# Patient Record
Sex: Male | Born: 1955 | Race: White | Hispanic: No | Marital: Married | Smoking: Current every day smoker
Health system: Southern US, Community
[De-identification: ages and names within clinical notes are randomized; demographics above are authoritative.]

## PROBLEM LIST (undated history)

## (undated) DIAGNOSIS — R454 Irritability and anger: Secondary | ICD-10-CM

## (undated) DIAGNOSIS — F419 Anxiety disorder, unspecified: Secondary | ICD-10-CM

## (undated) DIAGNOSIS — F17200 Nicotine dependence, unspecified, uncomplicated: Secondary | ICD-10-CM

## (undated) DIAGNOSIS — I10 Essential (primary) hypertension: Secondary | ICD-10-CM

## (undated) DIAGNOSIS — S42309A Unspecified fracture of shaft of humerus, unspecified arm, initial encounter for closed fracture: Secondary | ICD-10-CM

## (undated) DIAGNOSIS — M19049 Primary osteoarthritis, unspecified hand: Secondary | ICD-10-CM

## (undated) DIAGNOSIS — M1990 Unspecified osteoarthritis, unspecified site: Secondary | ICD-10-CM

## (undated) DIAGNOSIS — Z973 Presence of spectacles and contact lenses: Secondary | ICD-10-CM

## (undated) DIAGNOSIS — I4891 Unspecified atrial fibrillation: Secondary | ICD-10-CM

## (undated) HISTORY — DX: Anxiety disorder, unspecified: F41.9

## (undated) HISTORY — PX: SHOULDER ARTHROSCOPY: SHX128

## (undated) HISTORY — DX: Irritability and anger: R45.4

## (undated) HISTORY — DX: Primary osteoarthritis, unspecified hand: M19.049

## (undated) HISTORY — DX: Unspecified atrial fibrillation: I48.91

## (undated) HISTORY — PX: KNEE ARTHROSCOPY: SUR90

## (undated) HISTORY — DX: Essential (primary) hypertension: I10

## (undated) HISTORY — DX: Presence of spectacles and contact lenses: Z97.3

## (undated) HISTORY — DX: Nicotine dependence, unspecified, uncomplicated: F17.200

---

## 2002-11-14 ENCOUNTER — Emergency Department (HOSPITAL_COMMUNITY): Admission: AD | Admit: 2002-11-14 | Discharge: 2002-11-14 | Payer: Self-pay | Admitting: Emergency Medicine

## 2004-07-02 ENCOUNTER — Encounter: Admission: RE | Admit: 2004-07-02 | Discharge: 2004-07-02 | Payer: Self-pay | Admitting: Family Medicine

## 2004-12-27 ENCOUNTER — Encounter: Admission: RE | Admit: 2004-12-27 | Discharge: 2004-12-27 | Payer: Self-pay | Admitting: Family Medicine

## 2005-04-10 ENCOUNTER — Ambulatory Visit (HOSPITAL_BASED_OUTPATIENT_CLINIC_OR_DEPARTMENT_OTHER): Admission: RE | Admit: 2005-04-10 | Discharge: 2005-04-11 | Payer: Self-pay | Admitting: Orthopedic Surgery

## 2005-07-19 ENCOUNTER — Ambulatory Visit: Payer: Self-pay | Admitting: Family Medicine

## 2005-08-19 ENCOUNTER — Ambulatory Visit: Payer: Self-pay | Admitting: Family Medicine

## 2005-09-04 ENCOUNTER — Ambulatory Visit (HOSPITAL_BASED_OUTPATIENT_CLINIC_OR_DEPARTMENT_OTHER): Admission: RE | Admit: 2005-09-04 | Discharge: 2005-09-04 | Payer: Self-pay | Admitting: Orthopedic Surgery

## 2006-03-24 ENCOUNTER — Ambulatory Visit: Payer: Self-pay | Admitting: Family Medicine

## 2006-04-07 ENCOUNTER — Ambulatory Visit: Payer: Self-pay | Admitting: Family Medicine

## 2006-10-17 ENCOUNTER — Ambulatory Visit: Payer: Self-pay | Admitting: Family Medicine

## 2007-01-14 ENCOUNTER — Encounter: Admission: RE | Admit: 2007-01-14 | Discharge: 2007-01-14 | Payer: Self-pay | Admitting: Family Medicine

## 2007-01-14 ENCOUNTER — Ambulatory Visit: Payer: Self-pay | Admitting: Family Medicine

## 2007-01-19 ENCOUNTER — Ambulatory Visit: Payer: Self-pay | Admitting: Family Medicine

## 2007-01-21 ENCOUNTER — Ambulatory Visit: Payer: Self-pay | Admitting: Family Medicine

## 2007-02-05 ENCOUNTER — Ambulatory Visit: Payer: Self-pay | Admitting: Family Medicine

## 2007-02-27 ENCOUNTER — Encounter: Admission: RE | Admit: 2007-02-27 | Discharge: 2007-02-27 | Payer: Self-pay | Admitting: Family Medicine

## 2008-07-05 ENCOUNTER — Ambulatory Visit: Payer: Self-pay | Admitting: Family Medicine

## 2009-01-27 ENCOUNTER — Ambulatory Visit: Payer: Self-pay | Admitting: Family Medicine

## 2010-01-14 HISTORY — PX: COLONOSCOPY: SHX174

## 2010-02-04 ENCOUNTER — Encounter: Payer: Self-pay | Admitting: Family Medicine

## 2010-06-01 NOTE — Op Note (Signed)
NAMEQUINTO, Troy Chapman             ACCOUNT NO.:  [id number]   MEDICAL RECORD NO.:  [id number]          PATIENT TYPE:  AMB   LOCATION:  DSC                          FACILITY:  [facility]   PHYSICIAN:  Loreta Ave, M.D. DATE OF BIRTH:  Jun 16, 1955   DATE OF PROCEDURE:  04/10/2005  DATE OF DISCHARGE:                                 OPERATIVE REPORT   PREOPERATIVE DIAGNOSIS:  Impingement, left shoulder.  Partial versus  complete rotator cuff tear.  Anterior labral tear.  Degenerative arthritis  AC joint.   POSTOPERATIVE DIAGNOSIS:  Impingement, left shoulder.  Partial versus  complete rotator cuff tear.  Anterior labral tear.  Degenerative arthritis  AC joint. Full thickness tear, rotator cuff.  Also grade 2 and 3  degenerative arthritis with some focal grade 4 changes, glenohumeral joint.   OPERATION PERFORMED:  Left shoulder examination under anesthesia,  arthroscopy, debridement of glenohumeral joint with chondroplasty,  debridement of labrum, assessment, debridement of rotator cuff.  Bursectomy.  Acromioplasty. CA ligament release.  Excision of distal clavicle.  Mini open  repair rotator cuff tear with FiberWire suture and Concept repair system.   SURGEON:  Loreta Ave, M.D.   ASSISTANT:  Genene Churn. Denton Meek.   ANESTHESIA:  General.   ESTIMATED BLOOD LOSS:  Minimal.   SPECIMENS:  None.   CULTURES:  None.   COMPLICATIONS:  None.   DRESSING:  Soft compressive with shoulder immobilizer.   DESCRIPTION OF PROCEDURE:  The patient was brought to the operating room and  placed on operating table in supine position at [facility] outpatient.  Shoulder  examined.  Full motion, good stability.  Placed in a beach chair position on  the shoulder positioner, prepped and draped in the usual sterile fashion.  Three standard portals, anterior, posterior lateral.  Shoulder entered with  blunt obturator, arthroscope introduced, shoulder inspected after being  distended.  Diffuse grade 2 and  3 changes of glenohumeral joint debrided.  Some worrisome focal grade 4 on the front of the glenoid and back of the  humerus.  Despite this, I was not seeing a pattern of instability.  Circumferential complex tearing labrum debrided.  Biceps tendon, biceps  anchor, capsular ligamentous structures all intact.  Chondral loose bodies  removed.  Undersurface rotator cuff, functional full thickness tearing  anterior half supraspinatus.  Just a few thin stretched fibers left.  Fortunately not much retraction.  Cannula redirected subacromially.  Confirmed impingement and tearing of the anterior aspect of the  supraspinatus and also interval tear running from lateral to medial towards  the back of the supraspinatus. Type 2 to 3 acromion.  Bursa resected, cuff  debrided.  Acromioplasty to a type 1 acromion with shaver and high speed  bur, release of CA ligament with cautery.  Distal clavicle grade 4 changes  periarticular spurs.  Lateral centimeter of clavicle, periarticular spurs  removed.  Adequacy of decompression and clavicle excision confirmed viewing  from all portals.  Instruments and fluid removed.  Deltoid splitting  incision through lateral portal.  Subacromial space accessed.  Good  decompression.  Cuff was debrided back to healthy  tissue and mobilized.  It  was then captured with a weave of FiberWire suture with #2 FiberWire x2.  The posterior suture was placed in a running manner to close the interval  tear and then sutures brought out laterally.  Sutures passed through a  series of drill holes in the tuberosity with the Concept repair system.  Arm  abducted.  Sutures firmly tied over a bony bridge.  Arm was then brought  through a full motion with excellent repair, watertight closure without  undue tension even with the arm at the side.  Nice firm closure.  Wound  irrigated.  Deltoid closed with Vicryl.  Skin and subcutaneous tissue with  Vicryl and portals closed with nylon.  I  injected Marcaine without  epinephrine.  Sterile compressive dressing applied.  Shoulder immobilizer  applied.  Anesthesia reversed.  Brought to recovery room.  Tolerated surgery  well, no complications.      Loreta Ave, M.D.  Electronically Signed     DFM/MEDQ  D:  04/11/2005  T:  04/12/2005  Job:  [id number]

## 2010-06-01 NOTE — Op Note (Signed)
Troy Chapman, Troy Chapman             ACCOUNT NO.:  [id number]   MEDICAL RECORD NO.:  [id number]          PATIENT TYPE:  AMB   LOCATION:  DSC                          FACILITY:  [facility]   PHYSICIAN:  Loreta Ave, M.D. DATE OF BIRTH:  May 29, 1955   DATE OF PROCEDURE:  09/04/2005  DATE OF DISCHARGE:                                 OPERATIVE REPORT   PREOPERATIVE DIAGNOSIS:  Right knee medial meniscus tear.   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  Reactive synovitis.   PROCEDURE PERFORMED:  Right knee examination under anesthesia, arthroscopy  with partial medial meniscectomy.  Extensive chondroplasty trochlea.  Lesser  extent chondroplasty medial femoral condyle.  Partial synovectomy.   SURGEON:  Loreta Ave, M.D.   ASSISTANT SURGEON:  Genene Churn. Denton Meek.   ANESTHESIA:  Knee block with sedation.   SPECIMENS:  None.   CULTURES:  None.   COMPLICATIONS:  None.   DRESSINGS:  Soft compressive.   DESCRIPTION OF PROCEDURE:  The patient was brought to the operating room and  placed onto the operating table in the supine position.  After adequate  anesthesia had been obtained, the leg was examined.  Full motion and good  stability.  Prepped and draped in the usual sterile fashion.  Three portals  created -- one superolateral; one each medial and lateral parapatellar.  Inflow catheter introduced, the knee distended.  Arthroscope introduced and  the knee inspected.  Good patellofemoral tracking.  The patella itself  actually looked good.  The whole central portion of the trochlea, however,  had deep grade III lesion.   Chondroplasty to a stable surface throughout.  Numerous chondral loose  bodies were removed.  A lot of hypertrophic synovitis throughout the knee  debrided.  Cruciate ligaments intact.  Lateral meniscus and lateral  compartment looked excellent.  Medial meniscus with extensive complex tear;  and posterior half taken off stable rim and tapered into remaining meniscus.  Much of the weightbearing dome, medial femoral condyle grade III changes  debrided.  Nothing grade IV.  Entire knee examined.  No other findings were  appreciated.  The instruments were  removed.  The portals in the knee were injected with Marcaine.  The ports  were closed with 4-0 nylon.  A sterile compressive dressing was applied.  Anesthesia was reversed.  The patient was brought to the recovery room.  Tolerated the surgery well with no complications.      Loreta Ave, M.D.  Electronically Signed     DFM/MEDQ  D:  09/04/2005  T:  09/04/2005  Job:  [id number]

## 2012-08-10 ENCOUNTER — Ambulatory Visit (INDEPENDENT_AMBULATORY_CARE_PROVIDER_SITE_OTHER): Payer: BC Managed Care – PPO | Admitting: Medical

## 2012-08-10 ENCOUNTER — Telehealth: Payer: Self-pay | Admitting: Internal Medicine

## 2012-08-10 ENCOUNTER — Other Ambulatory Visit: Payer: Self-pay | Admitting: Medical

## 2012-08-10 ENCOUNTER — Encounter: Payer: Self-pay | Admitting: Medical

## 2012-08-10 VITALS — BP 130/80 | HR 60 | Temp 98.2°F | Resp 16 | Ht 66.0 in | Wt 183.0 lb

## 2012-08-10 DIAGNOSIS — R454 Irritability and anger: Secondary | ICD-10-CM

## 2012-08-10 DIAGNOSIS — F411 Generalized anxiety disorder: Secondary | ICD-10-CM

## 2012-08-10 DIAGNOSIS — F17200 Nicotine dependence, unspecified, uncomplicated: Secondary | ICD-10-CM

## 2012-08-10 DIAGNOSIS — I10 Essential (primary) hypertension: Secondary | ICD-10-CM

## 2012-08-10 DIAGNOSIS — R4589 Other symptoms and signs involving emotional state: Secondary | ICD-10-CM

## 2012-08-10 DIAGNOSIS — M2550 Pain in unspecified joint: Secondary | ICD-10-CM

## 2012-08-10 DIAGNOSIS — F419 Anxiety disorder, unspecified: Secondary | ICD-10-CM

## 2012-08-10 MED ORDER — MELOXICAM 15 MG PO TABS: 15.0000 mg | ORAL_TABLET | Freq: Every day | ORAL | Status: DC

## 2012-08-10 MED ORDER — TRAMADOL HCL 50 MG PO TABS: 50.0000 mg | ORAL_TABLET | Freq: Three times a day (TID) | ORAL | Status: DC | PRN

## 2012-08-10 MED ORDER — LISINOPRIL 10 MG PO TABS: 10.0000 mg | ORAL_TABLET | Freq: Every day | ORAL | Status: DC

## 2012-08-10 NOTE — Telephone Encounter (Signed)
Called out Tramadol for pt per shane to [facility] [address]
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
## 2012-08-11 LAB — CBC WITH DIFFERENTIAL/PLATELET
Basophils Absolute: 0.1 10*3/uL (ref 0.0–0.1)
Basophils Relative: 1 % (ref 0–1)
Eosinophils Absolute: 0.3 10*3/uL (ref 0.0–0.7)
Eosinophils Relative: 3 % (ref 0–5)
HCT: 45.4 % (ref 39.0–52.0)
Hemoglobin: 15.1 g/dL (ref 13.0–17.0)
Lymphocytes Relative: 22 % (ref 12–46)
Lymphs Abs: 1.9 10*3/uL (ref 0.7–4.0)
MCH: 28.1 pg (ref 26.0–34.0)
MCHC: 33.3 g/dL (ref 30.0–36.0)
MCV: 84.4 fL (ref 78.0–100.0)
Monocytes Absolute: 0.6 10*3/uL (ref 0.1–1.0)
Monocytes Relative: 6 % (ref 3–12)
Neutro Abs: 6.1 10*3/uL (ref 1.7–7.7)
Neutrophils Relative %: 68 % (ref 43–77)
Platelets: 197 10*3/uL (ref 150–400)
RBC: 5.38 MIL/uL (ref 4.22–5.81)
RDW: 14.7 % (ref 11.5–15.5)
WBC: 8.9 10*3/uL (ref 4.0–10.5)

## 2012-08-11 LAB — COMPREHENSIVE METABOLIC PANEL
ALT: 21 U/L (ref 0–53)
AST: 21 U/L (ref 0–37)
Albumin: 4.6 g/dL (ref 3.5–5.2)
Alkaline Phosphatase: 76 U/L (ref 39–117)
BUN: 21 mg/dL (ref 6–23)
CO2: 27 mEq/L (ref 19–32)
Calcium: 9.5 mg/dL (ref 8.4–10.5)
Chloride: 106 mEq/L (ref 96–112)
Creat: 0.97 mg/dL (ref 0.50–1.35)
Glucose, Bld: 64 mg/dL — ABNORMAL LOW (ref 70–99)
Potassium: 4.5 mEq/L (ref 3.5–5.3)
Sodium: 144 mEq/L (ref 135–145)
Total Bilirubin: 0.4 mg/dL (ref 0.3–1.2)
Total Protein: 6.9 g/dL (ref 6.0–8.3)

## 2012-08-11 LAB — RHEUMATOID FACTOR: Rheumatoid fact SerPl-aCnc: <10 IU/mL (ref <=14)

## 2012-08-11 LAB — URIC ACID: Uric Acid, Serum: 7.6 mg/dL (ref 4.0–7.8)

## 2012-08-11 LAB — ANA: Anti Nuclear Antibody(ANA): NEGATIVE

## 2012-08-11 LAB — CYCLIC CITRUL PEPTIDE ANTIBODY, IGG: Cyclic Citrullin Peptide Ab: <2 U/mL (ref 0.0–5.0)

## 2012-08-11 LAB — SEDIMENTATION RATE: Sed Rate: 1 mm/hr (ref 0–16)

## 2012-11-09 ENCOUNTER — Telehealth: Payer: Self-pay | Admitting: Medical

## 2012-11-09 MED ORDER — CITALOPRAM HYDROBROMIDE 40 MG PO TABS: 40.0000 mg | ORAL_TABLET | Freq: Every day | ORAL | Status: DC

## 2012-11-09 NOTE — Telephone Encounter (Signed)
pls find out what the issue is

## 2012-11-09 NOTE — Telephone Encounter (Signed)
done

## 2012-11-09 NOTE — Telephone Encounter (Signed)
Please call concerning medication refill on celexa 40       [facility] [address]

## 2012-11-09 NOTE — Telephone Encounter (Signed)
Pt wants his celexa refilled? Is this okay

## 2013-04-09 ENCOUNTER — Other Ambulatory Visit: Payer: Self-pay | Admitting: Medical

## 2013-04-23 ENCOUNTER — Other Ambulatory Visit: Payer: Self-pay | Admitting: Medical

## 2013-04-23 NOTE — Telephone Encounter (Signed)
Is this okay to refill? 

## 2013-04-26 ENCOUNTER — Telehealth: Payer: Self-pay | Admitting: Family Medicine

## 2013-04-26 NOTE — Telephone Encounter (Signed)
Pt has appt this Friday with you but he has been out of Meloxicam for 1 week and is in pain.  Can you refill to [facility] on [address] before the appt.  Please advise pt [phone] [address]

## 2013-04-27 ENCOUNTER — Other Ambulatory Visit: Payer: Self-pay | Admitting: Medical

## 2013-04-27 MED ORDER — MELOXICAM 15 MG PO TABS: 15.0000 mg | ORAL_TABLET | Freq: Every day | ORAL | Status: DC

## 2013-04-30 ENCOUNTER — Encounter: Payer: Self-pay | Admitting: Medical

## 2013-04-30 ENCOUNTER — Ambulatory Visit (INDEPENDENT_AMBULATORY_CARE_PROVIDER_SITE_OTHER): Payer: BC Managed Care – PPO | Admitting: Medical

## 2013-04-30 VITALS — BP 130/80 | HR 68 | Temp 98.1°F | Resp 16 | Wt 183.0 lb

## 2013-04-30 DIAGNOSIS — F17200 Nicotine dependence, unspecified, uncomplicated: Secondary | ICD-10-CM | POA: Insufficient documentation

## 2013-04-30 DIAGNOSIS — F411 Generalized anxiety disorder: Secondary | ICD-10-CM | POA: Insufficient documentation

## 2013-04-30 DIAGNOSIS — M2550 Pain in unspecified joint: Secondary | ICD-10-CM | POA: Insufficient documentation

## 2013-04-30 DIAGNOSIS — I10 Essential (primary) hypertension: Secondary | ICD-10-CM | POA: Insufficient documentation

## 2013-04-30 HISTORY — DX: Nicotine dependence, unspecified, uncomplicated: F17.200

## 2013-04-30 HISTORY — DX: Generalized anxiety disorder: F41.1

## 2013-04-30 LAB — POCT URINALYSIS DIPSTICK
Bilirubin, UA: NEGATIVE
Blood, UA: NEGATIVE
Glucose, UA: NEGATIVE
Ketones, UA: NEGATIVE
Leukocytes, UA: NEGATIVE
Nitrite, UA: NEGATIVE
Spec Grav, UA: 1.025
Urobilinogen, UA: NEGATIVE
pH, UA: 5

## 2013-04-30 MED ORDER — TRAMADOL HCL 50 MG PO TABS: 50.0000 mg | ORAL_TABLET | Freq: Three times a day (TID) | ORAL | Status: DC | PRN

## 2013-04-30 MED ORDER — LISINOPRIL 10 MG PO TABS: 10.0000 mg | ORAL_TABLET | Freq: Every day | ORAL | Status: DC

## 2013-04-30 MED ORDER — CITALOPRAM HYDROBROMIDE 40 MG PO TABS: 40.0000 mg | ORAL_TABLET | Freq: Every day | ORAL | Status: DC

## 2013-04-30 NOTE — Assessment & Plan Note (Signed)
Ran out of BP medication a week ago, been tolerating medication without c/o.   Doesn't check BP

## 2013-04-30 NOTE — Assessment & Plan Note (Signed)
Takes Citalopram for anxiety, been on this 3 years.   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   celexa works fine.

## 2013-04-30 NOTE — Progress Notes (Signed)
   Subjective:   Troy Chapman is a 58 y.o. male presenting on 04/30/2013 with med refill  Here for f/u.  Last visit here 7/ 2014.   Essential hypertension, benign Ran out of BP medication a week ago, been tolerating medication without c/o.   Doesn't check BP  Tobacco use disorder Still smoking 1 ppd.  Joint pain He reports joint pains all over.  Relies on Meloxicam daily for relief.   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  All his joints hurt.  Works as Music therapist.  Uses tramadol like candy currently, when out of medication self medicates with alcohol.  Has seen Dr. Eulah Pont in the past, who advised that the thumbs could be fused surgically, but not a lot of options with this.  Anxiety state, unspecified Takes Citalopram for anxiety, been on this 3 years.   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   celexa works fine.  No other complaint.  Review of Systems ROS as in subjective      Objective:     Filed Vitals:   04/30/13 0812  BP: 130/80  Pulse: 68  Temp: 98.1 F (36.7 C)  Resp: 16    General appearance: alert, no distress, WD/WN, white male Neck: supple, no lymphadenopathy, no thyromegaly, no masses, no bruits Heart: RRR, normal S1, S2, no murmurs Lungs: faint wheezes, somewhat reduced lungs sounds in general,  No rhonchi, or rales Abdomen: +bs, soft, non tender, non distended, no masses, no hepatomegaly, no splenomegaly, no bruits Pulses: 2+ symmetric, upper and lower extremities, normal cap refill Neuro: nonfocal exam MSK: right wrist reduced ROM in general, mild swelling over right wrist at base of thumb which is tender, bilat thumbs with bony arthritis changes, nodules that are tender and putting pressure on the tendons of bilat metacarpals 4th and 5th fingers.  Right knee with bony arthritic changes at joint line, along medial and lateral tibia. Left hip decreased ext ROM, pain with ROM. Right hip reduced internal ROM. Right great toe  with bony arthritic changes at MTP. Rest of LE unremarkable Ext: no edema     Assessment: Encounter Diagnoses  Name Primary?  . Essential hypertension, benign Yes  . Anxiety state, unspecified   . Tobacco use disorder   . Joint pain      Plan: Hypertension-continue current medication lisinopril, labs today fasting Anxiety-does well on citalopram, continue current medication Tobacco use-strongly advise he consider stopping tobacco as I do believe it is starting to cause COPD related changes. He is not ready to quit Joint pain-had a long discussion about options for therapy, advise he followup with Dr. Eulah Pont orthopedics, we will use a trial of Celebrex, and can continue tramadol as needed.  Discussed risk and benefits of long-term use of NSAIDs. I asked him to call back in a week to let me know how the Celebrex worked.  Troy Chapman was seen today for med refill.  Diagnoses and associated orders for this visit:  Essential hypertension, benign  Anxiety state, unspecified  Tobacco use disorder  Joint pain     Return pending labs.

## 2013-04-30 NOTE — Assessment & Plan Note (Signed)
Still smoking 1 ppd 

## 2013-04-30 NOTE — Patient Instructions (Signed)
Thank you for giving me the opportunity to serve you today.    Your diagnosis today includes: Encounter Diagnoses  Name Primary?  . Essential hypertension, benign Yes  . Anxiety state, unspecified   . Tobacco use disorder   . Joint pain      Specific recommendations today include:  Continue your blood pressure medication Lisinopril as usual  Continue Celexa as usual  I strongly recommend you stop tobacco as I do believe it is affecting your lung function  We can consider counseling or medication for tobacco cessation  Lets begin a trial of Celebrex 200 once daily for arthritis instead of meloxicam  You can still use tramadol as needed  I recommend you have a followup with Dr. Eulah Pont regarding the nodules within the tendon sheaths  Return pending labs.    I have included other useful information below for your review.  YOU CAN QUIT SMOKING!  Talk to your medical provider about using medicines to help you quit. These include nicotine replacement gum, lozenges, or skin patches.  Consider calling 1-800-QUIT-NOW, a toll free 24/7 hotline with free counseling to help you quit.  If you are ready to quit smoking or are thinking about it, congratulations! You have chosen to help yourself be healthier and live longer! There are lots of different ways to quit smoking. Nicotine gum, nicotine patches, a nicotine inhaler, or nicotine nasal spray can help with physical craving. Hypnosis, support groups, and medicines help break the habit of smoking. TIPS TO GET OFF AND STAY OFF CIGARETTES  Learn to predict your moods. Do not let a bad situation be your excuse to have a cigarette. Some situations in your life might tempt you to have a cigarette.   Ask friends and co-workers not to smoke around you.   Make your home smoke-free.   Never have "just one" cigarette. It leads to wanting another and another. Remind yourself of your decision to quit.   On a card, make a list of your  reasons for not smoking. Read it at least the same number of times a day as you have a cigarette. Tell yourself everyday, "I do not want to smoke. I choose not to smoke."   Ask someone at home or work to help you with your plan to quit smoking.   Have something planned after you eat or have a cup of coffee. Take a walk or get other exercise to perk you up. This will help to keep you from overeating.   Try a relaxation exercise to calm you down and decrease your stress. Remember, you may be tense and nervous the first two weeks after you quit. This will pass.   Find new activities to keep your hands busy. Play with a pen, coin, or rubber band. Doodle or draw things on paper.   Brush your teeth right after eating. This will help cut down the craving for the taste of tobacco after meals. You can try mouthwash too.   Try gum, breath mints, or diet candy to keep something in your mouth.  IF YOU SMOKE AND WANT TO QUIT:  Do not stock up on cigarettes. Never buy a carton. Wait until one pack is finished before you buy another.   Never carry cigarettes with you at work or at home.   Keep cigarettes as far away from you as possible. Leave them with someone else.   Never carry matches or a lighter with you.   Ask yourself, "Do I need this  cigarette or is this just a reflex?"   Bet with someone that you can quit. Put cigarette money in a piggy bank every morning. If you smoke, you give up the money. If you do not smoke, by the end of the week, you keep the money.   Keep trying. It takes 21 days to change a habit!  Document Released: 10/27/2008 Document Revised: 09/12/2010 Document Reviewed: 10/27/2008 [facility] Patient Information 2012 [place], [place].   Chronic Obstructive Pulmonary Disease Chronic obstructive pulmonary disease (COPD) is a common lung condition in which airflow from the lungs is limited. COPD is a general term that can be used to describe many different lung problems that limit  airflow, including both chronic bronchitis and emphysema. If you have COPD, your lung function will probably never return to normal, but there are measures you can take to improve lung function and make yourself feel better.  CAUSES   Smoking (common).   Exposure to secondhand smoke.   Genetic problems.  Chronic inflammatory lung diseases or recurrent infections. SYMPTOMS   Shortness of breath, especially with physical activity.   Deep, persistent (chronic) cough with a large amount of thick mucus.   Wheezing.   Rapid breaths (tachypnea).   Gray or bluish discoloration (cyanosis) of the skin, especially in fingers, toes, or lips.   Fatigue.   Weight loss.   Frequent infections or episodes when breathing symptoms become much worse (exacerbations).   Chest tightness. DIAGNOSIS  Your healthcare provider will take a medical history and perform a physical examination to make the initial diagnosis. Additional tests for COPD may include:   Lung (pulmonary) function tests.  Chest X-ray.  CT scan.  Blood tests. TREATMENT  Treatment available to help you feel better when you have COPD include:   Inhaler and nebulizer medicines. These help manage the symptoms of COPD and make your breathing more comfortable  Supplemental oxygen. Supplemental oxygen is only helpful if you have a low oxygen level in your blood.   Exercise and physical activity. These are beneficial for nearly all people with COPD. Some people may also benefit from a pulmonary rehabilitation program. HOME CARE INSTRUCTIONS   Take all medicines (inhaled or pills) as directed by your health care provider.  Only take over-the-counter or prescription medicines for pain, fever, or discomfort as directed by your health care provider.   Avoid over-the-counter medicines or cough syrups that dry up your airway (such as antihistamines) and slow down the elimination of secretions unless instructed otherwise  by your healthcare provider.   If you are a smoker, the most important thing that you can do is stop smoking. Continuing to smoke will cause further lung damage and breathing trouble. Ask your health care provider for help with quitting smoking. He or she can direct you to community resources or hospitals that provide support.  Avoid exposure to irritants such as smoke, chemicals, and fumes that aggravate your breathing.  Use oxygen therapy and pulmonary rehabilitation if directed by your health care provider. If you require home oxygen therapy, ask your healthcare provider whether you should purchase a pulse oximeter to measure your oxygen level at home.   Avoid contact with individuals who have a contagious illness.  Avoid extreme temperature and humidity changes.  Eat healthy foods. Eating smaller, more frequent meals and resting before meals may help you maintain your strength.  Stay active, but balance activity with periods of rest. Exercise and physical activity will help you maintain your ability to do things  you want to do.  Preventing infection and hospitalization is very important when you have COPD. Make sure to receive all the vaccines your health care provider recommends, especially the pneumococcal and influenza vaccines. Ask your healthcare provider whether you need a pneumonia vaccine.  Learn and use relaxation techniques to manage stress.  Learn and use controlled breathing techniques as directed by your health care provider. Controlled breathing techniques include:   Pursed lip breathing. Start by breathing in (inhaling) through your nose for 1 second. Then, purse your lips as if you were going to whistle and breathe out (exhale) through the pursed lips for 2 seconds.   Diaphragmatic breathing. Start by putting one hand on your abdomen just above your waist. Inhale slowly through your nose. The hand on your abdomen should move out. Then purse your lips and exhale slowly.  You should be able to feel the hand on your abdomen moving in as you exhale.   Learn and use controlled coughing to clear mucus from your lungs. Controlled coughing is a series of short, progressive coughs. The steps of controlled coughing are:  1. Lean your head slightly forward.  2. Breathe in deeply using diaphragmatic breathing.  3. Try to hold your breath for 3 seconds.  4. Keep your mouth slightly open while coughing twice.  5. Spit any mucus out into a tissue.  6. Rest and repeat the steps once or twice as needed. SEEK MEDICAL CARE IF:   You are coughing up more mucus than usual.   There is a change in the color or thickness of your mucus.   Your breathing is more labored than usual.   Your breathing is faster than usual.  SEEK IMMEDIATE MEDICAL CARE IF:   You have shortness of breath while you are resting.   You have shortness of breath that prevents you from:  Being able to talk.   Performing your usual physical activities.   You have chest pain lasting longer than 5 minutes.   Your skin color is more cyanotic than usual.  You measure low oxygen saturations for longer than 5 minutes with a pulse oximeter. MAKE SURE YOU:   Understand these instructions.  Will watch your condition.  Will get help right away if you are not doing well or get worse. Document Released: 10/10/2004 Document Revised: 10/21/2012 Document Reviewed: 08/27/2012 [facility] Patient Information 2014 [place], [place].

## 2013-04-30 NOTE — Assessment & Plan Note (Addendum)
He reports joint pains all over.  Relies on Meloxicam daily for relief.   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  All his joints hurt.  Works as Music therapist.  Uses tramadol like candy currently, when out of medication self medicates with alcohol.  Has seen Dr. Eulah Pont in the past, who advised that the thumbs could be fused surgically, but not a lot of options with this.

## 2013-05-01 LAB — COMPREHENSIVE METABOLIC PANEL
ALT: 20 U/L (ref 0–53)
AST: 22 U/L (ref 0–37)
Albumin: 4.4 g/dL (ref 3.5–5.2)
Alkaline Phosphatase: 61 U/L (ref 39–117)
BUN: 12 mg/dL (ref 6–23)
CO2: 25 mEq/L (ref 19–32)
Calcium: 8.9 mg/dL (ref 8.4–10.5)
Chloride: 104 mEq/L (ref 96–112)
Creat: 0.75 mg/dL (ref 0.50–1.35)
Glucose, Bld: 64 mg/dL — ABNORMAL LOW (ref 70–99)
Potassium: 4.4 mEq/L (ref 3.5–5.3)
Sodium: 141 mEq/L (ref 135–145)
Total Bilirubin: 0.3 mg/dL (ref 0.2–1.2)
Total Protein: 6.5 g/dL (ref 6.0–8.3)

## 2013-05-01 LAB — LIPID PANEL
Cholesterol: 186 mg/dL (ref 0–200)
HDL: 56 mg/dL (ref >39)
LDL Cholesterol: 79 mg/dL (ref 0–99)
Total CHOL/HDL Ratio: 3.3 Ratio
Triglycerides: 254 mg/dL — ABNORMAL HIGH (ref <150)
VLDL: 51 mg/dL — ABNORMAL HIGH (ref 0–40)

## 2013-05-01 LAB — CBC
HCT: 45.4 % (ref 39.0–52.0)
Hemoglobin: 14.6 g/dL (ref 13.0–17.0)
MCH: 27.8 pg (ref 26.0–34.0)
MCHC: 32.2 g/dL (ref 30.0–36.0)
MCV: 86.5 fL (ref 78.0–100.0)
Platelets: 180 10*3/uL (ref 150–400)
RBC: 5.25 MIL/uL (ref 4.22–5.81)
RDW: 14.6 % (ref 11.5–15.5)
WBC: 4.6 10*3/uL (ref 4.0–10.5)

## 2013-06-07 ENCOUNTER — Other Ambulatory Visit: Payer: Self-pay | Admitting: Medical

## 2013-06-08 NOTE — Telephone Encounter (Signed)
IS THIS OKAY TO REFILL 

## 2013-11-03 ENCOUNTER — Other Ambulatory Visit: Payer: Self-pay | Admitting: Medical

## 2013-11-04 ENCOUNTER — Other Ambulatory Visit: Payer: Self-pay | Admitting: Medical

## 2013-11-04 NOTE — Telephone Encounter (Signed)
Ok to RF? 

## 2013-11-04 NOTE — Telephone Encounter (Signed)
Left message for patient to call back and schedule fasting physical per Vincenza Hews.

## 2013-11-04 NOTE — Telephone Encounter (Signed)
Please set him up for a physical, fasting.

## 2013-11-26 ENCOUNTER — Telehealth: Payer: Self-pay | Admitting: Medical

## 2013-11-26 NOTE — Telephone Encounter (Signed)
Pt made an appt for early December

## 2013-11-26 NOTE — Telephone Encounter (Signed)
Make OV appt to discuss the recent sleep test done by his dentist

## 2013-12-11 ENCOUNTER — Other Ambulatory Visit: Payer: Self-pay | Admitting: Medical

## 2013-12-15 ENCOUNTER — Ambulatory Visit
Admission: RE | Admit: 2013-12-15 | Discharge: 2013-12-15 | Disposition: A | Discharge status: Home / Self Care | Payer: BC Managed Care – PPO | Source: Ambulatory Visit | Attending: Medical | Admitting: Medical

## 2013-12-15 ENCOUNTER — Encounter: Payer: Self-pay | Admitting: Medical

## 2013-12-15 ENCOUNTER — Ambulatory Visit (INDEPENDENT_AMBULATORY_CARE_PROVIDER_SITE_OTHER): Payer: BC Managed Care – PPO | Admitting: Medical

## 2013-12-15 VITALS — BP 138/88 | HR 60 | Temp 98.6°F | Resp 16 | Ht 67.0 in | Wt 183.0 lb

## 2013-12-15 DIAGNOSIS — R202 Paresthesia of skin: Secondary | ICD-10-CM

## 2013-12-15 DIAGNOSIS — Z720 Tobacco use: Secondary | ICD-10-CM

## 2013-12-15 DIAGNOSIS — I10 Essential (primary) hypertension: Secondary | ICD-10-CM | POA: Insufficient documentation

## 2013-12-15 DIAGNOSIS — M24549 Contracture, unspecified hand: Secondary | ICD-10-CM

## 2013-12-15 DIAGNOSIS — F411 Generalized anxiety disorder: Secondary | ICD-10-CM

## 2013-12-15 DIAGNOSIS — F17200 Nicotine dependence, unspecified, uncomplicated: Secondary | ICD-10-CM | POA: Insufficient documentation

## 2013-12-15 DIAGNOSIS — M25549 Pain in joints of unspecified hand: Secondary | ICD-10-CM | POA: Insufficient documentation

## 2013-12-15 DIAGNOSIS — Z202 Contact with and (suspected) exposure to infections with a predominantly sexual mode of transmission: Secondary | ICD-10-CM

## 2013-12-15 DIAGNOSIS — M79643 Pain in unspecified hand: Secondary | ICD-10-CM

## 2013-12-15 DIAGNOSIS — F1910 Other psychoactive substance abuse, uncomplicated: Secondary | ICD-10-CM

## 2013-12-15 DIAGNOSIS — Z0000 Encounter for general adult medical examination without abnormal findings: Secondary | ICD-10-CM

## 2013-12-15 DIAGNOSIS — R5383 Other fatigue: Secondary | ICD-10-CM

## 2013-12-15 HISTORY — DX: Essential (primary) hypertension: I10

## 2013-12-15 HISTORY — DX: Paresthesia of skin: R20.2

## 2013-12-15 LAB — CBC
HCT: 48.3 % (ref 39.0–52.0)
Hemoglobin: 16.4 g/dL (ref 13.0–17.0)
MCH: 27.7 pg (ref 26.0–34.0)
MCHC: 34 g/dL (ref 30.0–36.0)
MCV: 81.7 fL (ref 78.0–100.0)
MPV: 10.3 fL (ref 9.4–12.4)
Platelets: 156 10*3/uL (ref 150–400)
RBC: 5.91 MIL/uL — ABNORMAL HIGH (ref 4.22–5.81)
RDW: 15.6 % — ABNORMAL HIGH (ref 11.5–15.5)
WBC: 7.2 10*3/uL (ref 4.0–10.5)

## 2013-12-15 LAB — LIPID PANEL
Cholesterol: 164 mg/dL (ref 0–200)
HDL: 60 mg/dL (ref >39)
LDL Cholesterol: 88 mg/dL (ref 0–99)
Total CHOL/HDL Ratio: 2.7 Ratio
Triglycerides: 82 mg/dL (ref <150)
VLDL: 16 mg/dL (ref 0–40)

## 2013-12-15 LAB — COMPREHENSIVE METABOLIC PANEL
ALT: 31 U/L (ref 0–53)
AST: 23 U/L (ref 0–37)
Albumin: 4.2 g/dL (ref 3.5–5.2)
Alkaline Phosphatase: 56 U/L (ref 39–117)
BUN: 15 mg/dL (ref 6–23)
CO2: 27 mEq/L (ref 19–32)
Calcium: 9.2 mg/dL (ref 8.4–10.5)
Chloride: 103 mEq/L (ref 96–112)
Creat: 0.85 mg/dL (ref 0.50–1.35)
Glucose, Bld: 89 mg/dL (ref 70–99)
Potassium: 4.3 mEq/L (ref 3.5–5.3)
Sodium: 138 mEq/L (ref 135–145)
Total Bilirubin: 0.5 mg/dL (ref 0.2–1.2)
Total Protein: 6.6 g/dL (ref 6.0–8.3)

## 2013-12-15 MED ORDER — CITALOPRAM HYDROBROMIDE 20 MG PO TABS: ORAL_TABLET | ORAL | Status: DC

## 2013-12-15 MED ORDER — TRAMADOL HCL 50 MG PO TABS: 50.0000 mg | ORAL_TABLET | Freq: Three times a day (TID) | ORAL | Status: DC | PRN

## 2013-12-15 MED ORDER — MELOXICAM 15 MG PO TABS: 15.0000 mg | ORAL_TABLET | Freq: Every day | ORAL | Status: DC

## 2013-12-15 MED ORDER — CLONAZEPAM 0.5 MG PO TABS: 0.5000 mg | ORAL_TABLET | Freq: Two times a day (BID) | ORAL | Status: DC | PRN

## 2013-12-15 MED ORDER — LISINOPRIL 10 MG PO TABS: 10.0000 mg | ORAL_TABLET | Freq: Every day | ORAL | Status: DC

## 2013-12-15 NOTE — Progress Notes (Signed)
Subjective:   HPI  Troy Chapman is a 58 y.o. male who presents for a complete physical.   Preventative care:1 year ago here Last ophthalmology visit:2015  Last dental visit:Dr. Toni Arthurs 2015 Last colonoscopy:2 years ago Last prostate exam:2014  Last ZOX:WRUEA ago Last labs:today  Prior vaccinations: TD or Tdap:current Influenza:declines Pneumococcal:no Shingles/Zostavax:no  Concerns: Compliant with BP medications.  Main c/o ongoing is bilat hand and wrist pains.  Thumbs seemed to have fused.   Works in [employer], and every hit of the hammer causes pain.  daily hand pain.   Uses mobic daily, sometimes topical OTC creams, self medicates with alcohol and tobacco.  Has had low energy for months.  Bought some black market testosterone, clean needles and syringes from pharmacy, giving self injections weekly  Anxiety - thinks citalopram needs to be a little stronger  Reviewed their medical, surgical, family, social, medication, and allergy history and updated chart as appropriate.  Past Medical History  Diagnosis Date  . Anger   . Anxiety   . Joint pain   . Hypertension   . Smoker     Past Surgical History  Procedure Laterality Date  . Colonoscopy  2012    Dr. Elnoria Howard    History   Social History  . Marital Status: Married    Spouse Name: N/A    Number of Children: N/A  . Years of Education: N/A   Occupational History  . Not on file.   Social History Main Topics  . Smoking status: Current Every Day Smoker -- 1.00 packs/day  . Smokeless tobacco: Not on file  . Alcohol Use: Yes     Comment: every day beers and then some wine  . Drug Use: No  . Sexual Activity: Not on file   Other Topics Concern  . Not on file   Social History Narrative    Family History  Problem Relation Age of Onset  . Heart disease Mother     died of MI, diagnosed in 54s  . Alzheimer's disease Father     Current outpatient prescriptions: lisinopril (PRINIVIL,ZESTRIL) 10 MG  tablet, Take 1 tablet (10 mg total) by mouth daily., Disp: 90 tablet, Rfl: 3;  meloxicam (MOBIC) 15 MG tablet, Take 1 tablet (15 mg total) by mouth daily., Disp: 30 tablet, Rfl: 3;  citalopram (CELEXA) 20 MG tablet, 3 tablets po daily, Disp: 90 tablet, Rfl: 1 clonazePAM (KLONOPIN) 0.5 MG tablet, Take 1 tablet (0.5 mg total) by mouth 2 (two) times daily as needed for anxiety., Disp: 45 tablet, Rfl: 0;  traMADol (ULTRAM) 50 MG tablet, Take 1 tablet (50 mg total) by mouth every 8 (eight) hours as needed., Disp: 60 tablet, Rfl: 0  No Known Allergies   Review of Systems Constitutional: -fever, -chills, -sweats, -unexpected weight change, -decreased appetite, -fatigue Allergy: -sneezing, -itching, -congestion Dermatology: -changing moles, --rash, -lumps ENT: -runny nose, -ear pain, -sore throat, -hoarseness, -sinus pain, -teeth pain, - ringing in ears, -hearing loss, -nosebleeds Cardiology: -chest pain, -palpitations, -swelling, -difficulty breathing when lying flat, -waking up short of breath Respiratory: -cough, -shortness of breath, -difficulty breathing with exercise or exertion, -wheezing, -coughing up blood Gastroenterology: +abdominal pain, -nausea, -vomiting, -diarrhea, -constipation, -blood in stool, -changes in bowel movement, -difficulty swallowing or eating Hematology: -bleeding, -bruising  Musculoskeletal: +joint aches, -muscle aches,+-joint swelling, -back pain, -neck pain, -cramping, -changes in gait Ophthalmology: denies vision changes, eye redness, itching, discharge Urology: -burning with urination, -difficulty urinating, -blood in urine, -urinary frequency, -urgency, -incontinence Neurology: -headache, -weakness, -tingling, +  numbness, -memory loss, -falls, -dizziness Psychology: -depressed mood, -agitation, +sleep problems     Objective:   Physical Exam  BP 138/88 mmHg  Pulse 60  Temp(Src) 98.6 F (37 C) (Oral)  Resp 16  Ht 5\' 7"  (1.702 m)  Wt 183 lb (83.008 kg)  BMI  28.66 kg/m2  General appearance: alert, no distress, WD/WN, white male Skin: scattered macules, no particular worrisome lesions HENT: PERRLA, EOMi, conjunctiva normal, nares patent, TMs pearly, teeth in good repair Oral: MMM, no lesions Neck: supple, no lymphadenopathy, no thyromegaly, no masses, no bruits Heart: RRR, normal S1, S2, no murmurs Lungs: faint wheezes, somewhat reduced lungs sounds in general, No rhonchi, or rales Abdomen: +bs, soft, non tender, non distended, no masses, no hepatomegaly, no splenomegaly, no bruits Back: nontender, no deformity Pulses: 2+ symmetric, upper and lower extremities, normal cap refill Neuro: nonfocal exam MSK: right wrist reduced ROM in general, mild swelling over right wrist at base of thumb which is tender, bilat thumbs with bony arthritis changes, decreased strength and ROM of bilat thumbs at carpometacarpal joints,  nodules that are tender and putting pressure on the tendons of bilat metacarpals 4th and 5th fingers. Right knee with bony arthritic changes at joint line, along medial and lateral tibia. Left hip decreased ext ROM, pain with ROM. Right hip reduced internal ROM. Right great toe with bony arthritic changes at MTP. Rest of LE unremarkable Ext: no edema, no cyanosis, no clubbing GU: normal male genitalia, circumcised, no mass, no lymphadenopathy, no hernia DRE: refuses   Assessment and Plan :   Encounter Diagnoses  Name Primary?  . Encounter for health maintenance examination in adult   . Venereal disease contact   . Substance abuse   . Decreased energy   . Smoker   . Hand joint pain, unspecified laterality   . Right hand paresthesia   . Contracture of hand joint, unspecified laterality   . Essential hypertension   . Anxiety state Yes   Physical exam - discussed healthy lifestyle, diet, exercise, preventative care, vaccinations, and addressed their concerns.   Advised against use of black market testosterone.  Screening today  for HIV/hepatitis. Decreased energey - stop black market testosterone.  Plan to do testosterone lab in 3-4 weeks. Smoker - advised he consider stopping tobacco.  Not ready to quit.   Hand pain, paresthesias, contracture - likely osteoarthritis, possible fusion of bilat thumbs to carpal bones, contractures present.  He will go for xrays of hands.  Referral to hand surgeon regarding contractures HTN - c/t same medication Anxiety - increase to Citalopram 60mg , discussed risks/benefits, clonazepam prn. Declines prostate exam Return pending labs, referral, xrays.

## 2013-12-16 LAB — HEPATITIS B SURFACE ANTIGEN: Hepatitis B Surface Ag: NEGATIVE

## 2013-12-16 LAB — HEPATITIS C ANTIBODY: HCV Ab: NEGATIVE

## 2013-12-16 LAB — HEPATITIS B CORE ANTIBODY, IGM: Hep B C IgM: NONREACTIVE

## 2013-12-16 LAB — HIV ANTIBODY (ROUTINE TESTING W REFLEX): HIV 1&2 Ab, 4th Generation: NONREACTIVE

## 2013-12-16 LAB — PSA: PSA: 0.35 ng/mL (ref <=4.00)

## 2013-12-28 ENCOUNTER — Other Ambulatory Visit: Payer: Self-pay | Admitting: Family Medicine

## 2013-12-28 DIAGNOSIS — M79641 Pain in right hand: Secondary | ICD-10-CM

## 2013-12-28 DIAGNOSIS — R202 Paresthesia of skin: Secondary | ICD-10-CM

## 2014-01-04 ENCOUNTER — Other Ambulatory Visit: Payer: Self-pay | Admitting: Family Medicine

## 2014-01-04 DIAGNOSIS — R202 Paresthesia of skin: Secondary | ICD-10-CM

## 2014-01-14 DIAGNOSIS — G4730 Sleep apnea, unspecified: Secondary | ICD-10-CM | POA: Insufficient documentation

## 2014-01-14 HISTORY — DX: Sleep apnea, unspecified: G47.30

## 2014-01-17 ENCOUNTER — Telehealth: Payer: Self-pay | Admitting: Family Medicine

## 2014-01-17 ENCOUNTER — Other Ambulatory Visit: Payer: Self-pay | Admitting: Medical

## 2014-01-17 MED ORDER — CLONAZEPAM 0.5 MG PO TABS: 0.5000 mg | ORAL_TABLET | Freq: Two times a day (BID) | ORAL | Status: DC | PRN

## 2014-01-17 MED ORDER — TRAMADOL HCL 50 MG PO TABS: 50.0000 mg | ORAL_TABLET | Freq: Three times a day (TID) | ORAL | Status: DC | PRN

## 2014-01-17 NOTE — Telephone Encounter (Signed)
He was suppose to let me know how the Citalopram is doing at higher dose? Find out.     Once he has stopped the black market testosterone, plan to check afternoon Testosterone lab in 3-4 weeks.  I printed out new scripts for the 2 medications.  I don't normally refill for lost/stolen/accidentally destroyed medications, but he would technically be available for refill at this time anyhow.  Also, make sure the referral was made for ortho.  Has he seen them yet?

## 2014-01-17 NOTE — Telephone Encounter (Signed)
Wife called and states she evidently threw aware pt written rx that you gave him last visit.  She was cleaning up and thought they were just receipts and threw them away.  I reviewed chart and it appears the printed rx were for Klonopin and Ultram.  Please advise pt wife [facility] (she is on his HIPAA) ph# [phone]

## 2014-01-17 NOTE — Telephone Encounter (Signed)
Patient states that he is doing well on the higher dose of the Celexa. Patient was informed of the message from Crosby Oyster PA Patient had his appointment to ortho on 12/31/13

## 2014-02-17 ENCOUNTER — Other Ambulatory Visit: Payer: Self-pay | Admitting: Medical

## 2014-02-18 NOTE — Telephone Encounter (Signed)
IS OKAY TO REFILL?

## 2014-02-21 ENCOUNTER — Other Ambulatory Visit: Payer: Self-pay | Admitting: Medical

## 2014-02-21 MED ORDER — CITALOPRAM HYDROBROMIDE 20 MG PO TABS: ORAL_TABLET | ORAL | Status: DC

## 2014-02-21 NOTE — Telephone Encounter (Signed)
I sent the refill of citalopram for anxiety which he is at 60 mg daily.  Make f/u appt within next 30 days  Regarding tramadol, we referred to orthopedics which I believe he saw them in December so they should be taking over the medications for his joint pains.  Do we have the orthopedic notes as I don't see them?

## 2014-03-23 ENCOUNTER — Telehealth: Payer: Self-pay | Admitting: Medical

## 2014-03-23 ENCOUNTER — Encounter: Payer: Self-pay | Admitting: Medical

## 2014-03-23 ENCOUNTER — Ambulatory Visit (INDEPENDENT_AMBULATORY_CARE_PROVIDER_SITE_OTHER): Payer: BLUE CROSS/BLUE SHIELD | Admitting: Medical

## 2014-03-23 ENCOUNTER — Ambulatory Visit
Admission: RE | Admit: 2014-03-23 | Discharge: 2014-03-23 | Disposition: A | Discharge status: Home / Self Care | Payer: BLUE CROSS/BLUE SHIELD | Source: Ambulatory Visit | Attending: Medical | Admitting: Medical

## 2014-03-23 VITALS — BP 112/80 | HR 60 | Temp 98.4°F | Resp 14 | Wt 179.0 lb

## 2014-03-23 DIAGNOSIS — M19042 Primary osteoarthritis, left hand: Secondary | ICD-10-CM

## 2014-03-23 DIAGNOSIS — M1990 Unspecified osteoarthritis, unspecified site: Secondary | ICD-10-CM

## 2014-03-23 DIAGNOSIS — R0789 Other chest pain: Secondary | ICD-10-CM

## 2014-03-23 DIAGNOSIS — G4733 Obstructive sleep apnea (adult) (pediatric): Secondary | ICD-10-CM

## 2014-03-23 DIAGNOSIS — R1013 Epigastric pain: Secondary | ICD-10-CM

## 2014-03-23 DIAGNOSIS — M19041 Primary osteoarthritis, right hand: Secondary | ICD-10-CM

## 2014-03-23 DIAGNOSIS — K30 Functional dyspepsia: Secondary | ICD-10-CM

## 2014-03-23 DIAGNOSIS — F17200 Nicotine dependence, unspecified, uncomplicated: Secondary | ICD-10-CM

## 2014-03-23 DIAGNOSIS — F411 Generalized anxiety disorder: Secondary | ICD-10-CM

## 2014-03-23 DIAGNOSIS — R200 Anesthesia of skin: Secondary | ICD-10-CM

## 2014-03-23 DIAGNOSIS — R208 Other disturbances of skin sensation: Secondary | ICD-10-CM | POA: Diagnosis not present

## 2014-03-23 DIAGNOSIS — Z720 Tobacco use: Secondary | ICD-10-CM | POA: Diagnosis not present

## 2014-03-23 DIAGNOSIS — I10 Essential (primary) hypertension: Secondary | ICD-10-CM | POA: Diagnosis not present

## 2014-03-23 MED ORDER — TRAMADOL HCL 50 MG PO TABS
50.0000 mg | ORAL_TABLET | Freq: Three times a day (TID) | ORAL | Status: DC | PRN
Start: 2014-03-23 — End: 2014-06-15

## 2014-03-23 MED ORDER — GABAPENTIN 100 MG PO CAPS: 100.0000 mg | ORAL_CAPSULE | Freq: Two times a day (BID) | ORAL | Status: DC

## 2014-03-23 MED ORDER — CLONAZEPAM 0.5 MG PO TABS: 0.5000 mg | ORAL_TABLET | Freq: Two times a day (BID) | ORAL | Status: DC | PRN

## 2014-03-23 MED ORDER — CITALOPRAM HYDROBROMIDE 20 MG PO TABS: ORAL_TABLET | ORAL | Status: DC

## 2014-03-23 NOTE — Telephone Encounter (Signed)
Please find the sleep study which should be here somewhere, and refer for CPAP with [facility]  Please call the orthopedic office to get records from where we sent him for hand pain, remind them to send [place] reports from we refer people over

## 2014-03-23 NOTE — Patient Instructions (Signed)
Regarding chest pain and smoking  Go for chest x-ray today  Begin a baby aspirin daily 81 mg to reduce risk of heart disease  Cut back on alcohol  Continue your blood pressure medication daily  Limit high cholesterol foods such as red meat, fried foods, donuts, cakes pies, and other high cholesterol foods  Hand arthritis  Continue meloxicam 15 mg daily  Use Ultram as needed for worse pain  Lets begin gabapentin 100 mg in the morning and night time for pain and numbness  Sleep apnea-  We will try to hunt down your sleep study to get you referred for CPAP machine as soon as possible  STOP SMOKING!!!!!!!!

## 2014-03-23 NOTE — Telephone Encounter (Signed)
I fax over the patient's information and sleep study results to Fayrene Fearing at [facility] fax # [phone]. Fayrene Fearing states that he will send confirmation through [facility] and updates on setting things up for this patient.

## 2014-03-23 NOTE — Progress Notes (Signed)
Subjective:    Troy Chapman is a 59 y.o. male who presents for evaluation of chest pain.  accompanied by wife.  Awoke 1:20am felt like an elephant on his chest.   Tried to get up and move around to make pain go away.   Took alka seltzer and took 20-30 minutes to resolve.   Had some trouble focusing  .No associated arm pain, no jaw pain, no SOB, no sweats.  Couldn't really pin point the pain.  Felt sore over right lower chest, but had generalized pressure in chest.   Last night's meal was steak, 2 big salads, blue cheese and ranch, 3 pieces of garlic bread.  Patient's cardiac risk factors are: advanced age (older than 59 for men, 71 for women), hypertension, male gender, smoking/ tobacco exposure and mother had new onset MI at 84yo without prior heart disease. Marland Kitchen Previous cardiac testing: electrocardiogram (ECG).  He continues to have hand pain, arthriits.  Drinks alcohol approx bottle of wine nightly to cope.   Ran out of ultram. Still using Mobic.  Saw hand specialist but the surgery they offered would limit his grip and he uses his hands daily on the job.   Hasn't heard back from [place] on CPAP for sleep apnea.   Sleep study done by dentist was reportedly sent here.  Still smokes, enjoys smoking . He knows he shouldn't smoke, but not ready to quit.    Is out of refills on ultram and clonazepam.     The following portions of the patient's history were reviewed and updated as appropriate: allergies, current medications, past family history, past medical history, past social history, past surgical history and problem list.   Review of Systems Constitutional: denies fever, chills, sweats, unexpected weight change, anorexia, fatigue ENT: no runny nose, ear pain, sore throat, hoarseness, sinus pain, hearing loss, epistaxis Cardiology: denies palpitations, edema, orthopnea, paroxysmal nocturnal dyspnea Respiratory: denies cough, shortness of breath, dyspnea on exertion, wheezing,  hemoptysis Gastroenterology: denies abdominal pain, nausea, vomiting, diarrhea, constipation,  Hematology: denies bleeding or bruising problems Musculoskeletal: denies myalgias, joint swelling, back pain, neck pain, gait changes Ophthalmology: denies vision changes Urology: denies dysuria, difficulty urinating, hematuria, urinary frequency, urgency, incontinence Neurology: no headache, weakness, tingling, numbness, speech abnormality, memory loss, falls, dizziness Psychology: denies depressed mood, agitation, sleep problems    Objective:     BP 112/80 mmHg  Pulse 60  Temp(Src) 98.4 F (36.9 C) (Oral)  Resp 14  Wt 179 lb (81.194 kg)  SpO2 96%  General appearance: alert, no distress, WD/WN, male  Oral cavity: MMM, tongue normal, teeth normal Neck: supple, no lymphadenopathy, no thyromegaly, no JVD, no bruits, no masses, normal ROM Chest: non tender, normal shape and expansion Heart: RRR, normal S1, S2, no murmurs Lungs: few scattered upper field wheezes, no rhonchi, or rales Abdomen: +bs, soft, non tender, non distended, no masses, no hepatomegaly, no splenomegaly, no bruits Back: non tender, normal ROM, no scoliosis Musculoskeletal: bony arthritis changes of hands, particular bilat thumb, otherwise upper extremities non tender, no obvious deformity, normal ROM throughout, lower extremities non tender, no obvious deformity, normal ROM throughout Extremities: no edema, no cyanosis, no clubbing Pulses: 2+ symmetric, upper and lower extremities, normal cap refill Neurological: alert, oriented x 3, CN2-12 intact, strength normal upper extremities and lower extremities, sensation normal throughout, DTRs 2+ throughout, no cerebellar signs, gait normal Psychiatric: normal affect, behavior normal, pleasant      Adult ECG Report  Indication: chest tightness  Rate: 60 bpm  Rhythm: normal sinus rhythm  QRS Axis: 8 degrees  PR Interval:  QRS Duration: 50ms  QTc:  Conduction  Disturbances: none  Other Abnormalities: read out suggest septal infarct age indeterminate  Patient's cardiac risk factors are: advanced age (older than 72 for men, 64 for women), hypertension, male gender, smoking/ tobacco exposure and mother had MI around 57yo.  EKG comparison: 03/2006 unchanged  Narrative Interpretation: questionable lead placement issue causing read out of septal infarct, but no new changes    Assessment:   Encounter Diagnoses  Name Primary?  . Chest tightness Yes  . Smoker   . Essential hypertension   . Hand numbness   . Arthritis of both hands   . Generalized anxiety disorder   . OSA (obstructive sleep apnea)       Plan:   Chest tightness - we discussed his concerns, EKG findings, symptoms, mostly suggestive of indigestion today. He will use antacids throughout the day as needed, limit big portions today.  We did discuss his risk factors for heart disease which include smoking, age 59 year old male, HTN, family history.  We discussed the need to stop smoking.  His blood pressure is controlled. Begin 81 mg aspirin daily at bedtime  Smoker-not ready to quit, did discuss considering nicotine patches and counseling.  Spent 2 minutes discussing ways to quit and counseling.  Wife is also trying to push him to quit  Hypertension-controlled on current medication  Hand numbness, arthritis-will request the orthopedic record, continue meloxicam, and begin Neurontin 100 mg twice daily, discussed risk and benefits of medication, Ultram when necessary. Advise she cut back on alcohol which he is using to cope with the pain  Anxiety-continue current medications  Sleep apnea-referral for CPAP.  Spent >45 min face to face today in discussion, evaluation, treatment recommendations, counseling

## 2014-03-24 NOTE — Progress Notes (Signed)
LM to [facility] 

## 2014-04-29 ENCOUNTER — Ambulatory Visit (INDEPENDENT_AMBULATORY_CARE_PROVIDER_SITE_OTHER): Payer: BLUE CROSS/BLUE SHIELD | Admitting: Medical

## 2014-04-29 ENCOUNTER — Encounter: Payer: Self-pay | Admitting: Medical

## 2014-04-29 VITALS — BP 112/68 | HR 71 | Temp 98.9°F | Resp 15 | Wt 182.0 lb

## 2014-04-29 DIAGNOSIS — E291 Testicular hypofunction: Secondary | ICD-10-CM | POA: Diagnosis not present

## 2014-04-29 DIAGNOSIS — R7989 Other specified abnormal findings of blood chemistry: Secondary | ICD-10-CM

## 2014-04-29 DIAGNOSIS — R208 Other disturbances of skin sensation: Secondary | ICD-10-CM | POA: Diagnosis not present

## 2014-04-29 DIAGNOSIS — Z720 Tobacco use: Secondary | ICD-10-CM | POA: Diagnosis not present

## 2014-04-29 DIAGNOSIS — R0602 Shortness of breath: Secondary | ICD-10-CM | POA: Diagnosis not present

## 2014-04-29 DIAGNOSIS — R5383 Other fatigue: Secondary | ICD-10-CM

## 2014-04-29 DIAGNOSIS — R079 Chest pain, unspecified: Secondary | ICD-10-CM | POA: Diagnosis not present

## 2014-04-29 DIAGNOSIS — F17200 Nicotine dependence, unspecified, uncomplicated: Secondary | ICD-10-CM

## 2014-04-29 DIAGNOSIS — R200 Anesthesia of skin: Secondary | ICD-10-CM

## 2014-04-29 NOTE — Progress Notes (Signed)
Subjective: Here for f/u.  He has had no additional chest pain or SOB since last visit but here for spirometry as a f/u.  Hand numbness much improved on gabapentin but it makes him sleepy so just taking at night  Here for recheck on TST.   Been on injections and gels prior, but doesn't want gels.   Does have fatigue, decrease energy.  No other new c/o.   ROS as in subjective  Objective: BP 112/68 mmHg  Pulse 71  Temp(Src) 98.9 F (37.2 C) (Oral)  Resp 15  Wt 182 lb (82.555 kg)  Gen: wd,wn,nad otherwise not examined   Assessment: Encounter Diagnoses  Name Primary?  . Low testosterone Yes  . Hypogonadism in male   . Tobacco abuse   . Other fatigue   . SOB (shortness of breath)   . Smoker   . Chest pain, unspecified chest pain type   . Hand numbness     Plan: Low TST, hypogonadism - lab today, he would prefer TST injections which he has used in the past.   Didn't like gels in the past.  Gets too sweaty on the job, medicine didn't absorb well  Tobacco use - advised cessation  fatigue - TST lab today  SOB, smoker, chest pain - symptoms resolved from last visit, spirometry done today normal.  Hand numbness - he will let me know if he wants to increase to 300mg  QHS gabapentin vs staying at 200mg  [facility]

## 2014-04-30 LAB — FSH/LH
FSH: 10.3 m[IU]/mL (ref 1.4–18.1)
LH: 15.8 m[IU]/mL — ABNORMAL HIGH (ref 1.5–9.3)

## 2014-04-30 LAB — TESTOSTERONE: Testosterone: 166 ng/dL — ABNORMAL LOW (ref 300–890)

## 2014-04-30 LAB — PROLACTIN: Prolactin: 6.8 ng/mL (ref 2.1–17.1)

## 2014-05-02 ENCOUNTER — Other Ambulatory Visit: Payer: Self-pay | Admitting: Medical

## 2014-05-02 MED ORDER — TESTOSTERONE CYPIONATE 200 MG/ML IM SOLN: 200.0000 mg | INTRAMUSCULAR | Status: DC

## 2014-05-19 ENCOUNTER — Telehealth: Payer: Self-pay | Admitting: Medical

## 2014-05-23 NOTE — Telephone Encounter (Signed)
P.A. Approved til 01/13/38, faxed pharmacy, pt informed

## 2014-06-15 ENCOUNTER — Other Ambulatory Visit: Payer: Self-pay | Admitting: Medical

## 2014-06-15 NOTE — Telephone Encounter (Signed)
Is it okay to refill these 2 medications?

## 2014-06-16 ENCOUNTER — Other Ambulatory Visit: Payer: Self-pay | Admitting: Medical

## 2014-06-16 ENCOUNTER — Telehealth: Payer: Self-pay | Admitting: Medical

## 2014-06-16 ENCOUNTER — Telehealth: Payer: Self-pay

## 2014-06-16 MED ORDER — TRAMADOL HCL 50 MG PO TABS: 50.0000 mg | ORAL_TABLET | Freq: Three times a day (TID) | ORAL | Status: DC | PRN

## 2014-06-16 NOTE — Telephone Encounter (Signed)
error 

## 2014-06-16 NOTE — Telephone Encounter (Signed)
LM that script is ready for pick up

## 2014-06-16 NOTE — Telephone Encounter (Signed)
Call out both. 

## 2014-07-22 ENCOUNTER — Other Ambulatory Visit: Payer: Self-pay | Admitting: Medical

## 2014-09-24 ENCOUNTER — Other Ambulatory Visit: Payer: Self-pay | Admitting: Medical

## 2014-09-26 NOTE — Telephone Encounter (Signed)
Is this okay to refill? 

## 2014-11-22 ENCOUNTER — Other Ambulatory Visit: Payer: Self-pay | Admitting: Medical

## 2014-11-22 NOTE — Telephone Encounter (Signed)
Are these okay to refill? 

## 2014-11-23 ENCOUNTER — Ambulatory Visit (INDEPENDENT_AMBULATORY_CARE_PROVIDER_SITE_OTHER): Payer: BLUE CROSS/BLUE SHIELD | Admitting: Medical

## 2014-11-23 ENCOUNTER — Encounter: Payer: Self-pay | Admitting: Medical

## 2014-11-23 VITALS — BP 126/90 | HR 78 | Temp 98.7°F | Resp 12 | Wt 173.8 lb

## 2014-11-23 DIAGNOSIS — M25541 Pain in joints of right hand: Secondary | ICD-10-CM

## 2014-11-23 DIAGNOSIS — J40 Bronchitis, not specified as acute or chronic: Secondary | ICD-10-CM | POA: Diagnosis not present

## 2014-11-23 DIAGNOSIS — H109 Unspecified conjunctivitis: Secondary | ICD-10-CM | POA: Diagnosis not present

## 2014-11-23 DIAGNOSIS — J329 Chronic sinusitis, unspecified: Secondary | ICD-10-CM | POA: Diagnosis not present

## 2014-11-23 DIAGNOSIS — E291 Testicular hypofunction: Secondary | ICD-10-CM

## 2014-11-23 DIAGNOSIS — Z720 Tobacco use: Secondary | ICD-10-CM

## 2014-11-23 DIAGNOSIS — R202 Paresthesia of skin: Secondary | ICD-10-CM

## 2014-11-23 DIAGNOSIS — F411 Generalized anxiety disorder: Secondary | ICD-10-CM

## 2014-11-23 DIAGNOSIS — F17200 Nicotine dependence, unspecified, uncomplicated: Secondary | ICD-10-CM

## 2014-11-23 MED ORDER — GABAPENTIN 100 MG PO CAPS: 100.0000 mg | ORAL_CAPSULE | Freq: Two times a day (BID) | ORAL | Status: DC

## 2014-11-23 MED ORDER — MELOXICAM 15 MG PO TABS: 15.0000 mg | ORAL_TABLET | Freq: Every day | ORAL | Status: DC

## 2014-11-23 MED ORDER — CITALOPRAM HYDROBROMIDE 20 MG PO TABS: ORAL_TABLET | ORAL | Status: DC

## 2014-11-23 MED ORDER — POLYMYXIN B-TRIMETHOPRIM 10000-0.1 UNIT/ML-% OP SOLN: 1.0000 [drp] | Freq: Four times a day (QID) | OPHTHALMIC | Status: DC

## 2014-11-23 MED ORDER — AZITHROMYCIN 250 MG PO TABS: ORAL_TABLET | ORAL | Status: DC

## 2014-11-23 MED ORDER — BENZONATATE 200 MG PO CAPS: 200.0000 mg | ORAL_CAPSULE | Freq: Three times a day (TID) | ORAL | Status: DC | PRN

## 2014-11-23 NOTE — Progress Notes (Signed)
  Subjective:  Troy Chapman is a 59 y.o. male who presents for respiratory illness.  He reports 2 week hx/o cough, congested in head and chest, coughing up phlegm, but main concern is possible pink eye.   Feels like he may be over the hump with the congestion.   Has felt fever the first few days.   No NVD.  Has been wheezy at times.   He reports push discharge every morning, crusting in the morning, eyes a little sore to touch, red eyes, L>R.   Has had sick contacts.  Using [employer] plus.   He is a smoker.  No other aggravating or relieving factors.   Been using TST injections every 2 weeks, but not consistently for months.  It really helps with energy  Needs refills on his other medications as well.   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   Past Medical History  Diagnosis Date  . Anger   . Anxiety   . Joint pain   . Hypertension   . Smoker     ROS as in subjective   Objective: BP 126/90 mmHg  Pulse 78  Temp(Src) 98.7 F (37.1 C) (Oral)  Resp 12  Wt 173 lb 12.8 oz (78.835 kg)  Gen: wd, wn, nad Skin: unremarkable Hent: head mild sinus tender, TMs pearly, nares patent, pharynx normal Lungs few rhonchi, otherwise no wheezes, no rales, clear otherwise Heart RRR, normal s1, s2, no murmurs Ext: no edema Neck: supple, nontender, no mass, no thyromegaly, no lymphadenopathy       Assessment  Encounter Diagnoses  Name Primary?  . Bilateral conjunctivitis Yes  . Sinobronchitis   . Generalized anxiety disorder   . Hypogonadism, male   . Right hand paresthesia   . Arthralgia of right hand   . Smoker      Plan: Conjunctivitis - discussed diagnosis, hygiene, treatment, begin Polytrim drops, f/u if not resolved within a week sinobronchitis - rest, hydrate well, discussed supportive care and begin Zpak, Tessalon perles for cough Refilled medications but advised he go ahead and scheduled physical and  fasting labs hypogonadism - lab today.  He has been using TST injections every 2 weeks but not consistently.  F/u pending labs.

## 2014-11-24 ENCOUNTER — Telehealth: Payer: Self-pay

## 2014-11-24 LAB — TESTOSTERONE: Testosterone: 788 ng/dL (ref 300–890)

## 2014-11-24 MED ORDER — TESTOSTERONE CYPIONATE 200 MG/ML IM SOLN: INTRAMUSCULAR | Status: DC

## 2014-11-24 NOTE — Telephone Encounter (Signed)
Wants to know about klonopin refill.

## 2014-11-24 NOTE — Addendum Note (Signed)
Addended by: Kieth Brightly on: 11/24/2014 02:58 PM   Modules accepted: Orders

## 2014-11-24 NOTE — Telephone Encounter (Signed)
LMTCB

## 2014-11-24 NOTE — Telephone Encounter (Signed)
Verify how often he is taking, let me know.

## 2014-11-25 NOTE — Telephone Encounter (Signed)
Pt said he is taking it 4-5 times a month. [place] Sp[place]iel its only when he has trouble sleeping. [place] Spaniel its been atleast a year since his last one, he just likes to have them incase he needs them was the way it sounded to me

## 2014-11-28 ENCOUNTER — Other Ambulatory Visit: Payer: Self-pay | Admitting: Medical

## 2014-11-28 DIAGNOSIS — F411 Generalized anxiety disorder: Secondary | ICD-10-CM

## 2014-11-28 MED ORDER — CLONAZEPAM 0.5 MG PO TABS: 0.5000 mg | ORAL_TABLET | Freq: Two times a day (BID) | ORAL | Status: DC | PRN

## 2014-11-28 NOTE — Telephone Encounter (Signed)
Left on vmail for pharmacist they said otherwise id wait 10+ minutes to speak with him.

## 2014-11-28 NOTE — Telephone Encounter (Signed)
Please call out clonazepam #45 with 1 refill.  I ordered it electronically as well.

## 2014-12-21 ENCOUNTER — Ambulatory Visit (INDEPENDENT_AMBULATORY_CARE_PROVIDER_SITE_OTHER): Payer: BLUE CROSS/BLUE SHIELD | Admitting: Medical

## 2014-12-21 ENCOUNTER — Encounter: Payer: Self-pay | Admitting: Medical

## 2014-12-21 VITALS — BP 110/80 | HR 68 | Ht 67.5 in | Wt 170.0 lb

## 2014-12-21 DIAGNOSIS — I10 Essential (primary) hypertension: Secondary | ICD-10-CM

## 2014-12-21 DIAGNOSIS — F411 Generalized anxiety disorder: Secondary | ICD-10-CM

## 2014-12-21 DIAGNOSIS — F17200 Nicotine dependence, unspecified, uncomplicated: Secondary | ICD-10-CM | POA: Diagnosis not present

## 2014-12-21 DIAGNOSIS — M19041 Primary osteoarthritis, right hand: Secondary | ICD-10-CM | POA: Insufficient documentation

## 2014-12-21 DIAGNOSIS — M19042 Primary osteoarthritis, left hand: Secondary | ICD-10-CM | POA: Diagnosis not present

## 2014-12-21 DIAGNOSIS — R202 Paresthesia of skin: Secondary | ICD-10-CM | POA: Diagnosis not present

## 2014-12-21 DIAGNOSIS — Z129 Encounter for screening for malignant neoplasm, site unspecified: Secondary | ICD-10-CM | POA: Diagnosis not present

## 2014-12-21 DIAGNOSIS — Z630 Problems in relationship with spouse or partner: Secondary | ICD-10-CM

## 2014-12-21 DIAGNOSIS — E291 Testicular hypofunction: Secondary | ICD-10-CM | POA: Diagnosis not present

## 2014-12-21 DIAGNOSIS — Z0000 Encounter for general adult medical examination without abnormal findings: Secondary | ICD-10-CM

## 2014-12-21 HISTORY — DX: Testicular hypofunction: E29.1

## 2014-12-21 HISTORY — DX: Primary osteoarthritis, right hand: M19.041

## 2014-12-21 HISTORY — DX: Encounter for screening for malignant neoplasm, site unspecified: Z12.9

## 2014-12-21 LAB — POCT URINALYSIS DIPSTICK
Bilirubin, UA: NEGATIVE
Blood, UA: NEGATIVE
Glucose, UA: NEGATIVE
Ketones, UA: NEGATIVE
Leukocytes, UA: NEGATIVE
Nitrite, UA: NEGATIVE
Protein, UA: NEGATIVE
Spec Grav, UA: >=1.03
Urobilinogen, UA: NEGATIVE
pH, UA: 5

## 2014-12-21 LAB — CBC
HCT: 49.9 % (ref 39.0–52.0)
Hemoglobin: 16.4 g/dL (ref 13.0–17.0)
MCH: 30.1 pg (ref 26.0–34.0)
MCHC: 32.9 g/dL (ref 30.0–36.0)
MCV: 91.6 fL (ref 78.0–100.0)
MPV: 11.1 fL (ref 8.6–12.4)
Platelets: 170 10*3/uL (ref 150–400)
RBC: 5.45 MIL/uL (ref 4.22–5.81)
RDW: 14.3 % (ref 11.5–15.5)
WBC: 6.7 10*3/uL (ref 4.0–10.5)

## 2014-12-21 LAB — COMPREHENSIVE METABOLIC PANEL
ALT: 23 U/L (ref 9–46)
AST: 26 U/L (ref 10–35)
Albumin: 4.5 g/dL (ref 3.6–5.1)
Alkaline Phosphatase: 51 U/L (ref 40–115)
BUN: 9 mg/dL (ref 7–25)
CO2: 23 mmol/L (ref 20–31)
Calcium: 9.4 mg/dL (ref 8.6–10.3)
Chloride: 106 mmol/L (ref 98–110)
Creat: 0.84 mg/dL (ref 0.70–1.33)
Glucose, Bld: 73 mg/dL (ref 65–99)
Potassium: 4.6 mmol/L (ref 3.5–5.3)
Sodium: 141 mmol/L (ref 135–146)
Total Bilirubin: 0.5 mg/dL (ref 0.2–1.2)
Total Protein: 6.9 g/dL (ref 6.1–8.1)

## 2014-12-21 LAB — LIPID PANEL
Cholesterol: 153 mg/dL (ref 125–200)
HDL: 88 mg/dL (ref >=40)
LDL Cholesterol: 54 mg/dL (ref <130)
Total CHOL/HDL Ratio: 1.7 Ratio (ref <=5.0)
Triglycerides: 57 mg/dL (ref <150)
VLDL: 11 mg/dL (ref <30)

## 2014-12-21 MED ORDER — TRAMADOL HCL 50 MG PO TABS: 50.0000 mg | ORAL_TABLET | Freq: Three times a day (TID) | ORAL | Status: DC | PRN

## 2014-12-21 MED ORDER — MELOXICAM 15 MG PO TABS: 15.0000 mg | ORAL_TABLET | Freq: Every day | ORAL | Status: DC

## 2014-12-21 MED ORDER — CLONAZEPAM 0.5 MG PO TABS: 0.5000 mg | ORAL_TABLET | Freq: Two times a day (BID) | ORAL | Status: DC | PRN

## 2014-12-21 MED ORDER — LISINOPRIL 10 MG PO TABS: 10.0000 mg | ORAL_TABLET | Freq: Every day | ORAL | Status: DC

## 2014-12-21 MED ORDER — GABAPENTIN 100 MG PO CAPS: 100.0000 mg | ORAL_CAPSULE | Freq: Every day | ORAL | Status: DC

## 2014-12-21 MED ORDER — CITALOPRAM HYDROBROMIDE 20 MG PO TABS
ORAL_TABLET | ORAL | Status: DC
Start: 2014-12-21 — End: 2015-06-27

## 2014-12-21 NOTE — Progress Notes (Signed)
Subjective:   HPI  Troy Chapman is a 59 y.o. male who presents for a complete physical.  Having some marital issues.  He and wife separated 5 months ago but they are trying to work things out.   otherwise compliant with medications.     Reviewed their medical, surgical, family, social, medication, and allergy history and updated chart as appropriate.  Past Medical History  Diagnosis Date  . Anger   . Anxiety   . Hypertension   . Smoker   . Osteoarthritis of hand   . Wears contact lenses     Past Surgical History  Procedure Laterality Date  . Colonoscopy  2012    Dr. Elnoria Howard  . Shoulder arthroscopy      left x 2, and RTC  . Knee arthroscopy      right x 3    Social History   Social History  . Marital Status: Married    Spouse Name: N/A  . Number of Children: N/A  . Years of Education: N/A   Occupational History  . Not on file.   Social History Main Topics  . Smoking status: Current Every Day Smoker -- 1.00 packs/day for 40 years  . Smokeless tobacco: Not on file  . Alcohol Use: 6.0 oz/week    10 Glasses of wine per week     Comment: every day beers and then some wine  . Drug Use: No  . Sexual Activity: Not on file   Other Topics Concern  . Not on file   Social History Narrative   Married, 2 children, owns Holiday representative business, active on the job.    Family History  Problem Relation Age of Onset  . Heart disease Mother     died of MI, diagnosed in 21s  . Alzheimer's disease Father   . Stroke Maternal Grandmother   . Cancer Neg Hx      Current outpatient prescriptions:  .  citalopram (CELEXA) 20 MG tablet, 3 tablets po daily, Disp: 90 tablet, Rfl: 5 .  gabapentin (NEURONTIN) 100 MG capsule, Take 1 capsule (100 mg total) by mouth at bedtime., Disp: 90 capsule, Rfl: 3 .  lisinopril (PRINIVIL,ZESTRIL) 10 MG tablet, Take 1 tablet (10 mg total) by mouth daily., Disp: 90 tablet, Rfl: 3 .  meloxicam (MOBIC) 15 MG tablet, Take 1 tablet (15 mg total) by  mouth daily., Disp: 90 tablet, Rfl: 3 .  testosterone cypionate (DEPO-TESTOSTERONE) 200 MG/ML injection, Inject every 3 weeks, Disp: 10 mL, Rfl: 2 .  clonazePAM (KLONOPIN) 0.5 MG tablet, Take 1 tablet (0.5 mg total) by mouth 2 (two) times daily as needed for anxiety., Disp: 45 tablet, Rfl: 1 .  traMADol (ULTRAM) 50 MG tablet, Take 1 tablet (50 mg total) by mouth every 8 (eight) hours as needed., Disp: 60 tablet, Rfl: 1  No Known Allergies   Review of Systems Constitutional: -fever, -chills, -sweats, -unexpected weight change, -decreased appetite, -fatigue Allergy: -sneezing, -itching, -congestion Dermatology: -changing moles, --rash, -lumps ENT: -runny nose, -ear pain, -sore throat, -hoarseness, -sinus pain, -teeth pain, - ringing in ears, -hearing loss, -nosebleeds Cardiology: -chest pain, -palpitations, -swelling, -difficulty breathing when lying flat, -waking up short of breath Respiratory: -cough, -shortness of breath, -difficulty breathing with exercise or exertion, -wheezing, -coughing up blood Gastroenterology: -abdominal pain, -nausea, -vomiting, -diarrhea, -constipation, -blood in stool, -changes in bowel movement, -difficulty swallowing or eating Hematology: -bleeding, -bruising  Musculoskeletal: +joint aches, -muscle aches, -joint swelling, -back pain, -neck pain, -cramping, -changes in gait Ophthalmology: denies  vision changes, eye redness, itching, discharge Urology: -burning with urination, -difficulty urinating, -blood in urine, -urinary frequency, -urgency, -incontinence Neurology: -headache, -weakness, -tingling, +numbness, -memory loss, -falls, -dizziness Psychology: +depressed mood, -agitation, -sleep problems     Objective:   Physical Exam  BP 110/80 mmHg  Pulse 68  Ht 5' 7.5" (1.715 m)  Wt 170 lb (77.111 kg)  BMI 26.22 kg/m2   General appearance: alert, no distress, WD/WN, white male Skin: scattered macules, no worrisome lesions HEENT: normocephalic,  conjunctiva/corneas normal, sclerae anicteric, PERRLA, EOMi, nares patent, no discharge or erythema, pharynx normal Oral cavity: MMM, tongue normal, teeth in good repair Neck: supple, no lymphadenopathy, no thyromegaly, no masses, normal ROM, no bruits Chest: non tender, normal shape and expansion Heart: RRR, normal S1, S2, no murmurs Lungs: CTA bilaterally, no wheezes, rhonchi, or rales Abdomen: +bs, soft, non tender, non distended, no masses, no hepatomegaly, no splenomegaly, no bruits Back: non tender, normal ROM, no scoliosis Musculoskeletal: left shoulder port surgical scars, right knee anterior port surgical scars, otherwise upper extremities non tender, no obvious deformity, normal ROM throughout, lower extremities non tender, no obvious deformity, normal ROM throughout Extremities: no edema, no cyanosis, no clubbing Pulses: 2+ symmetric, upper and lower extremities, normal cap refill Neurological: alert, oriented x 3, CN2-12 intact, strength normal upper extremities and lower extremities, sensation normal throughout, DTRs 2+ throughout, no cerebellar signs, gait normal Psychiatric: normal affect, behavior normal, pleasant  GU: normal male external genitalia, circumcised, nontender, no masses, no hernia, no lymphadenopathy Rectal: deferred   Assessment and Plan :    Encounter Diagnoses  Name Primary?  . Routine general medical examination at a health care facility Yes  . Essential hypertension   . Generalized anxiety disorder   . Tobacco use disorder   . Paresthesia   . Primary osteoarthritis of both hands   . Screening for cancer   . Hypogonadism in male   . Marital stress    Physical exam - discussed healthy lifestyle, diet, exercise, preventative care, vaccinations, and addressed their concerns.   Advised smoking cessations Counseled on martial issues.  Recommended some books to read, advised marital counseling.   C/t same medications  See your eye doctor yearly for  routine vision care. See your dentist yearly for routine dental care including hygiene visits twice yearly.  discussed prostate exam which he declines, discussed he check no CT chest screening for lung cancer screen    Troy Chapman was seen today for annual exam.  Diagnoses and all orders for this visit:  Routine general medical examination at a health care facility -     Urinalysis Dipstick -     Comprehensive metabolic panel -     CBC -     Hemoglobin A1c -     Testosterone -     PSA -     Lipid panel  Essential hypertension -     Hemoglobin A1c  Generalized anxiety disorder -     clonazePAM (KLONOPIN) 0.5 MG tablet; Take 1 tablet (0.5 mg total) by mouth 2 (two) times daily as needed for anxiety.  Tobacco use disorder -     Hemoglobin A1c  Paresthesia  Primary osteoarthritis of both hands  Screening for cancer  Hypogonadism in male -     Testosterone -     Lipid panel  Marital stress  Other orders -     traMADol (ULTRAM) 50 MG tablet; Take 1 tablet (50 mg total) by mouth every 8 (eight) hours as needed. -  meloxicam (MOBIC) 15 MG tablet; Take 1 tablet (15 mg total) by mouth daily. -     lisinopril (PRINIVIL,ZESTRIL) 10 MG tablet; Take 1 tablet (10 mg total) by mouth daily. -     gabapentin (NEURONTIN) 100 MG capsule; Take 1 capsule (100 mg total) by mouth at bedtime. -     citalopram (CELEXA) 20 MG tablet; 3 tablets po daily   Follow-up pending labs

## 2014-12-21 NOTE — Patient Instructions (Signed)
Recommendations: Look into the book Five Love Languages or Love and Respect, 2 good books for both of you to read Consider counseling with [facility] counesling  See your eye doctor yearly for routine vision care. See your dentist yearly for routine dental care including hygiene visits twice yearly. Check insurance coverage for Chest CT lung cancer screening

## 2014-12-22 ENCOUNTER — Other Ambulatory Visit: Payer: Self-pay | Admitting: Medical

## 2014-12-22 LAB — TESTOSTERONE: Testosterone: 1158 ng/dL — ABNORMAL HIGH (ref 300–890)

## 2014-12-22 LAB — HEMOGLOBIN A1C
Hgb A1c MFr Bld: 5.4 % (ref <5.7)
Mean Plasma Glucose: 108 mg/dL (ref <117)

## 2014-12-22 LAB — PSA: PSA: 0.5 ng/mL (ref <=4.00)

## 2015-06-04 ENCOUNTER — Other Ambulatory Visit: Payer: Self-pay | Admitting: Medical

## 2015-06-05 ENCOUNTER — Telehealth: Payer: Self-pay | Admitting: Medical

## 2015-06-05 NOTE — Telephone Encounter (Signed)
Is this ok to refill?  

## 2015-06-05 NOTE — Telephone Encounter (Signed)
Pt stated that he ended up spilling some of the prescription by forgetting to put a needle on syringe. Last injection was 2 weeks ago would like one refill before he come in. Appt is 06/15/15

## 2015-06-05 NOTE — Telephone Encounter (Signed)
LMTCB

## 2015-06-05 NOTE — Telephone Encounter (Signed)
I received a refill request on testosterone.  We started testosterone back in 12/2014.  He is due for f/u appt at this time and recheck TST level.  I assume he is still using this monthly.   Find out when last injection was?

## 2015-06-05 NOTE — Telephone Encounter (Signed)
Call out 1 vial, and make sure he follows up as planned.

## 2015-06-06 NOTE — Telephone Encounter (Signed)
Phoned in.

## 2015-06-15 ENCOUNTER — Ambulatory Visit: Payer: BLUE CROSS/BLUE SHIELD | Admitting: Medical

## 2015-06-19 ENCOUNTER — Ambulatory Visit: Payer: BLUE CROSS/BLUE SHIELD | Admitting: Medical

## 2015-06-22 ENCOUNTER — Encounter: Payer: Self-pay | Admitting: Medical

## 2015-06-22 ENCOUNTER — Ambulatory Visit (INDEPENDENT_AMBULATORY_CARE_PROVIDER_SITE_OTHER): Payer: BLUE CROSS/BLUE SHIELD | Admitting: Medical

## 2015-06-22 VITALS — BP 116/78 | HR 58 | Wt 163.0 lb

## 2015-06-22 DIAGNOSIS — I10 Essential (primary) hypertension: Secondary | ICD-10-CM

## 2015-06-22 DIAGNOSIS — E291 Testicular hypofunction: Secondary | ICD-10-CM | POA: Diagnosis not present

## 2015-06-22 DIAGNOSIS — R7989 Other specified abnormal findings of blood chemistry: Secondary | ICD-10-CM

## 2015-06-22 MED ORDER — TESTOSTERONE CYPIONATE 200 MG/ML IM SOLN: INTRAMUSCULAR | Status: DC

## 2015-06-22 NOTE — Progress Notes (Signed)
Subjective: Chief Complaint  Patient presents with  . TST f/up    wants to stick with shot. is getting TST level checked today. no problems or concerns   Here to f/u on TST.  Been using TST injections.  Here for level.   Last injection was 3 weeks ago roughly.   Doing fine on medication, does well with libido, functio and energy.  He can tell when the medication is wearing off by the end of the 3rd week.  No new c/o   Past Medical History  Diagnosis Date  . Anger   . Anxiety   . Hypertension   . Smoker   . Osteoarthritis of hand   . Wears contact lenses    ROS as in subjective   Objective: BP 116/78 mmHg  Pulse 58  Wt 163 lb (73.936 kg)  Wt Readings from Last 3 Encounters:  06/22/15 163 lb (73.936 kg)  12/21/14 170 lb (77.111 kg)  11/23/14 173 lb 12.8 oz (78.835 kg)   General appearance: alert, no distress, WD/WN Neck: supple, no lymphadenopathy, no thyromegaly, no masses Heart: RRR, normal S1, S2, no murmurs Lungs: CTA bilaterally, no wheezes, rhonchi, or rales Pulses: 2+ symmetric, upper and lower extremities, normal cap refill   Assessment: Encounter Diagnoses  Name Primary?  . Hypogonadism in male Yes  . Low testosterone   . Essential hypertension      Plan: Lab today.  C/t TST injection q3wk.  Written script given the fact that EMR was down at time of visit.   HTN - pressures under control  Maleki was seen today for tst f/up.  Diagnoses and all orders for this visit:  Hypogonadism in male -     Testosterone  Low testosterone -     Testosterone  Essential hypertension  Other orders -     testosterone cypionate (DEPO-TESTOSTERONE) 200 MG/ML injection; Inject every 3 weeks

## 2015-06-23 LAB — TESTOSTERONE: Testosterone: 168 ng/dL — ABNORMAL LOW (ref 250–827)

## 2015-06-27 ENCOUNTER — Telehealth: Payer: Self-pay

## 2015-06-27 MED ORDER — CITALOPRAM HYDROBROMIDE 20 MG PO TABS: ORAL_TABLET | ORAL | Status: DC

## 2015-06-27 NOTE — Telephone Encounter (Signed)
pls refill with 5 refills

## 2015-06-27 NOTE — Telephone Encounter (Signed)
Pt called the office to ask for refills of celexa be sent to [facility] on [address].

## 2015-06-27 NOTE — Telephone Encounter (Signed)
done

## 2015-11-17 ENCOUNTER — Telehealth: Payer: Self-pay

## 2015-11-17 ENCOUNTER — Other Ambulatory Visit: Payer: Self-pay | Admitting: Medical

## 2015-11-17 MED ORDER — CITALOPRAM HYDROBROMIDE 20 MG PO TABS: ORAL_TABLET | ORAL | 2 refills | Status: DC

## 2015-11-17 NOTE — Telephone Encounter (Signed)
Pt request refill of [facility]italopram to [facility]. Trixie Rude

## 2015-11-27 ENCOUNTER — Other Ambulatory Visit: Payer: Self-pay | Admitting: Medical

## 2015-11-27 NOTE — Telephone Encounter (Signed)
Is this okay to refill? 

## 2015-12-15 ENCOUNTER — Other Ambulatory Visit: Payer: Self-pay | Admitting: Medical

## 2016-01-02 ENCOUNTER — Other Ambulatory Visit: Payer: Self-pay | Admitting: Medical

## 2016-02-25 ENCOUNTER — Other Ambulatory Visit: Payer: Self-pay | Admitting: Medical

## 2016-02-26 ENCOUNTER — Telehealth: Payer: Self-pay | Admitting: Medical

## 2016-02-26 NOTE — Telephone Encounter (Signed)
Go ahead and get him in for physical

## 2016-02-26 NOTE — Telephone Encounter (Signed)
Can pt have a refill on this 

## 2016-02-27 NOTE — Telephone Encounter (Signed)
Called left message for him to call back to set up for a physical

## 2016-03-22 ENCOUNTER — Other Ambulatory Visit: Payer: Self-pay | Admitting: Medical

## 2016-03-22 NOTE — Telephone Encounter (Signed)
Refill and schedule CPX 

## 2016-03-22 NOTE — Telephone Encounter (Signed)
Is this okay to call in. Pt is scheduled for Tuesday to come in for med check. He can not do a cpe as he doesn't have insurance and it will cost to much for him, but he will come in to get testosterone level checked and follow-up on bp med

## 2016-03-26 ENCOUNTER — Encounter: Payer: Self-pay | Admitting: Medical

## 2016-03-26 ENCOUNTER — Ambulatory Visit (INDEPENDENT_AMBULATORY_CARE_PROVIDER_SITE_OTHER): Payer: Self-pay | Admitting: Medical

## 2016-03-26 VITALS — BP 138/90 | HR 62 | Wt 169.0 lb

## 2016-03-26 DIAGNOSIS — F411 Generalized anxiety disorder: Secondary | ICD-10-CM

## 2016-03-26 DIAGNOSIS — E291 Testicular hypofunction: Secondary | ICD-10-CM

## 2016-03-26 DIAGNOSIS — M19042 Primary osteoarthritis, left hand: Secondary | ICD-10-CM

## 2016-03-26 DIAGNOSIS — I10 Essential (primary) hypertension: Secondary | ICD-10-CM

## 2016-03-26 DIAGNOSIS — F17200 Nicotine dependence, unspecified, uncomplicated: Secondary | ICD-10-CM

## 2016-03-26 DIAGNOSIS — M19041 Primary osteoarthritis, right hand: Secondary | ICD-10-CM

## 2016-03-26 MED ORDER — CITALOPRAM HYDROBROMIDE 20 MG PO TABS: ORAL_TABLET | ORAL | 2 refills | Status: DC

## 2016-03-26 MED ORDER — LISINOPRIL 10 MG PO TABS: 10.0000 mg | ORAL_TABLET | Freq: Every day | ORAL | 3 refills | Status: DC

## 2016-03-26 MED ORDER — CLONAZEPAM 0.5 MG PO TABS: 0.5000 mg | ORAL_TABLET | Freq: Two times a day (BID) | ORAL | 1 refills | Status: DC | PRN

## 2016-03-26 MED ORDER — TRAMADOL HCL 50 MG PO TABS: 50.0000 mg | ORAL_TABLET | Freq: Three times a day (TID) | ORAL | 1 refills | Status: DC | PRN

## 2016-03-26 MED ORDER — MELOXICAM 15 MG PO TABS: 15.0000 mg | ORAL_TABLET | Freq: Every day | ORAL | 1 refills | Status: DC

## 2016-03-26 MED ORDER — TESTOSTERONE CYPIONATE 200 MG/ML IM SOLN: INTRAMUSCULAR | 1 refills | Status: DC

## 2016-03-26 NOTE — Telephone Encounter (Signed)
Pt is coming in today. I will call in med when pharmacy opens

## 2016-03-26 NOTE — Progress Notes (Signed)
Subjective: Chief Complaint  Patient presents with  . med check    med check    Here for routine f/u.    Arthritis, particular hands but knees as well.  He has found that over the past year he realized gabapentin makes him incontinent.  Takes in prn now and it helps pains some.  Has been out of tramadol for months.  Likes to have this on hand for arthritis that flares worse periodically.  Uses mobic daily.  Hypertension - Taking the lisinopril 10mg  daily.  Not checking BPs regularly.    Mood - Takes the Citalopram 20mg  daily.  Uses Clonazepam periodically for sleep or to relax his nerves.   Work has been down, he is self employed, so feeling more stressed financially. He is considering taking job with another company.  Hypogonadism - Hasn't had testosterone in months.   Needs to restart this.  Without it, feels drained.  Past Medical History:  Diagnosis Date  . Anger   . Anxiety   . Hypertension   . Osteoarthritis of hand   . Smoker   . Wears contact lenses    ROS as in subjective   Objective: BP 138/90   Pulse 62   Wt 169 lb (76.7 kg)   SpO2 98%   BMI 26.08 kg/m   General appearance: alert, no distress, WD/WN  Neck: supple, no lymphadenopathy, no thyromegaly, no masses Heart: RRR, normal S1, S2, no murmurs Lungs: CTA bilaterally, no wheezes, rhonchi, or rales Pulses: 2+ symmetric, upper and lower extremities, normal cap refill Ext: no edema Bony arthritis changes of fingers and hands    Assessment: Encounter Diagnoses  Name Primary?  . Generalized anxiety disorder   . Essential hypertension Yes  . Hypogonadism in male   . Primary osteoarthritis of both hands   . Tobacco use disorder     Plan: C/t current medications, restart testosterone, refilled medications.   He does relatively well on current medications for OA and joint pains.   He will c/t clonazepam prn for sleep or anxiety.   He is doing ok on citalopram.  C/t lisinopril for HTN.  He will return in  about 6 weeks for labs.    Troy Chapman was seen today for med check.  Diagnoses and all orders for this visit:  Essential hypertension  Generalized anxiety disorder -     clonazePAM (KLONOPIN) 0.5 MG tablet; Take 1 tablet (0.5 mg total) by mouth 2 (two) times daily as needed for anxiety. -     Testosterone; Future -     Comprehensive metabolic panel; Future -     CBC; Future -     PSA; Future  Hypogonadism in male  Primary osteoarthritis of both hands  Tobacco use disorder  Other orders -     testosterone cypionate (DEPOTESTOSTERONE CYPIONATE) 200 MG/ML injection; INJECT EVERY 3 WEEKS -     lisinopril (PRINIVIL,ZESTRIL) 10 MG tablet; Take 1 tablet (10 mg total) by mouth daily. -     citalopram (CELEXA) 20 MG tablet; 3 tablets po daily -     traMADol (ULTRAM) 50 MG tablet; Take 1 tablet (50 mg total) by mouth every 8 (eight) hours as needed. -     meloxicam (MOBIC) 15 MG tablet; Take 1 tablet (15 mg total) by mouth daily.

## 2016-03-26 NOTE — Telephone Encounter (Signed)
Called in med 

## 2016-12-02 IMAGING — CR DG CHEST 2V
2 series · 2 of 2 positions shown · non-contrast
Comparison: 01/14/2007

CLINICAL DATA: Chest tightness last night.  Smoker.

EXAM:
CHEST  2 VIEW

[view not recorded (1 of 2)]
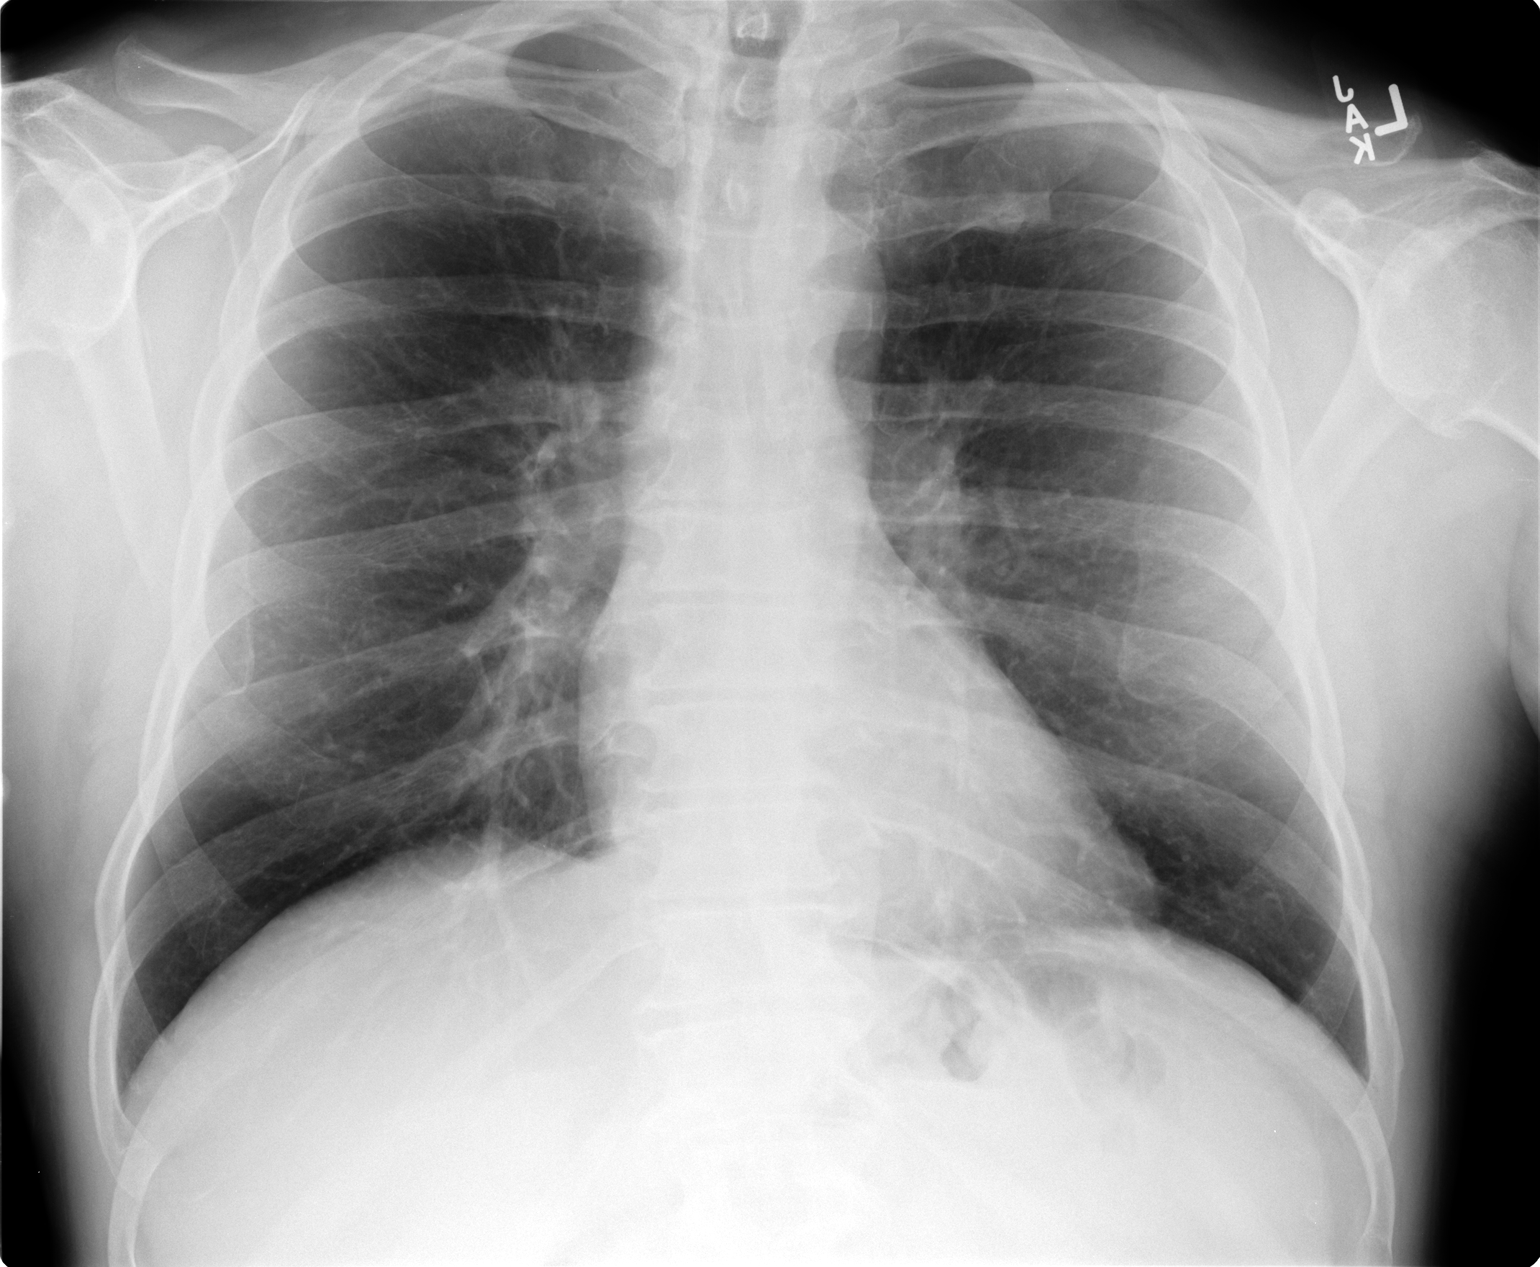

[view not recorded (2 of 2)]
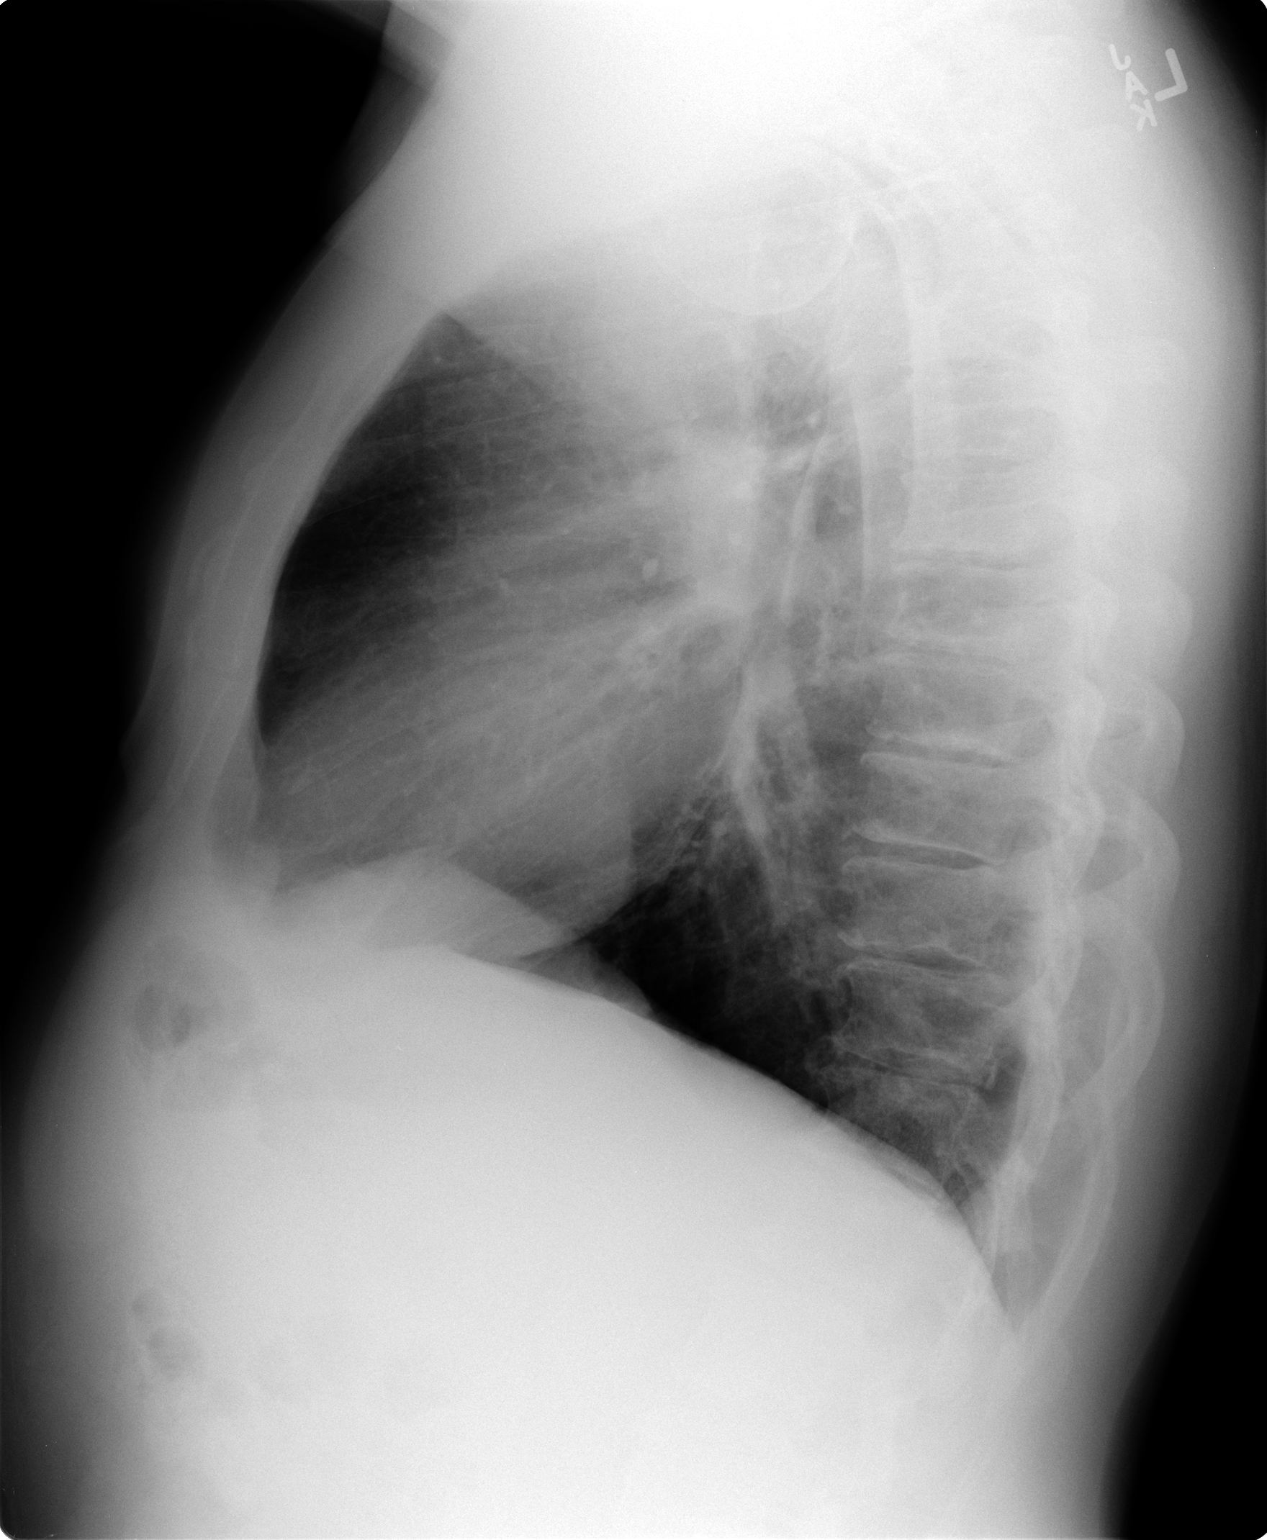

[2 of 2 positions shown; findings below may reference images not displayed]

FINDINGS: The heart size and mediastinal contours are within normal limits.
Both lungs are clear. The visualized skeletal structures are
unremarkable.
IMPRESSION: No active cardiopulmonary disease.

## 2017-08-09 ENCOUNTER — Other Ambulatory Visit: Payer: Self-pay

## 2017-08-09 ENCOUNTER — Emergency Department (HOSPITAL_COMMUNITY)
Admission: EM | Admit: 2017-08-09 | Discharge: 2017-08-09 | Disposition: A | Discharge status: Home / Self Care | Payer: Self-pay | Attending: Emergency Medicine | Admitting: Emergency Medicine

## 2017-08-09 ENCOUNTER — Encounter (HOSPITAL_COMMUNITY): Payer: Self-pay | Admitting: *Deleted

## 2017-08-09 ENCOUNTER — Emergency Department (HOSPITAL_COMMUNITY): Payer: Self-pay

## 2017-08-09 DIAGNOSIS — F17210 Nicotine dependence, cigarettes, uncomplicated: Secondary | ICD-10-CM | POA: Insufficient documentation

## 2017-08-09 DIAGNOSIS — M779 Enthesopathy, unspecified: Secondary | ICD-10-CM | POA: Insufficient documentation

## 2017-08-09 DIAGNOSIS — M7989 Other specified soft tissue disorders: Secondary | ICD-10-CM

## 2017-08-09 DIAGNOSIS — Z79899 Other long term (current) drug therapy: Secondary | ICD-10-CM | POA: Insufficient documentation

## 2017-08-09 DIAGNOSIS — I10 Essential (primary) hypertension: Secondary | ICD-10-CM | POA: Insufficient documentation

## 2017-08-09 DIAGNOSIS — M778 Other enthesopathies, not elsewhere classified: Secondary | ICD-10-CM

## 2017-08-09 MED ORDER — TRAMADOL HCL 50 MG PO TABS: 50.0000 mg | ORAL_TABLET | Freq: Four times a day (QID) | ORAL | 0 refills | Status: DC | PRN

## 2017-08-09 MED ORDER — MELOXICAM 7.5 MG PO TABS: 15.0000 mg | ORAL_TABLET | Freq: Every day | ORAL | 0 refills | Status: DC

## 2017-08-09 NOTE — Discharge Instructions (Signed)
STOP Motrin/Ibuprofen, Take Meloxicam every day as prescribed with food. Take this medicine with Pepcid to avoid stomach irritation/ulcers.  Take Ultram as needed for additional pain relief as prescribed. Wear brace, alternate ice/heat for 20 minutes at a time. Follow up with your primary care provider, referral to hand specialist given if needed. Return to Er for worsening or concerning symptoms.

## 2017-08-09 NOTE — ED Triage Notes (Signed)
Pt reports Rt hand pain ,swellikng started on Friday. Pt denies injury to hand but does have AR in hand.

## 2017-08-09 NOTE — ED Provider Notes (Signed)
[facility] EMERGENCY DEPARTMENT Provider Note   CSN: [id number] Arrival date & time: 08/09/17  1108     History   Chief Complaint Chief Complaint  Patient presents with  . Hand Problem    HPI Troy Chapman is a 62 y.o. male.  62 year old male presents with complaint of right hand pain and swelling.  Patient states that he is right-hand dominant, works as a Surveyor, minerals and has been using a skill saw for the past few days, but notes repetitive lifting and movements of the saw an awkward angles.  Patient states that he noticed pain starting 2 days ago, becoming progressively worse.  Pain to the dorsum of the right hand worse with extension of the fingers or extension of the wrist.  Denies redness of the hand or wrist, injury, open wounds or skin changes.  Patient has known arthritis in his wrist and hand, has been taking ibuprofen with limited relief of his pain.  No other complaints or concerns.     Past Medical History:  Diagnosis Date  . Anger   . Anxiety   . Hypertension   . Osteoarthritis of hand   . Smoker   . Wears contact lenses     Patient Active Problem List   Diagnosis Date Noted  . Routine general medical examination at a health care facility 12/21/2014  . Paresthesia 12/21/2014  . Primary osteoarthritis of both hands 12/21/2014  . Screening for cancer 12/21/2014  . Hypogonadism in male 12/21/2014  . Marital stress 12/21/2014  . Essential hypertension 12/15/2013  . Generalized anxiety disorder 04/30/2013  . Tobacco use disorder 04/30/2013    Past Surgical History:  Procedure Laterality Date  . COLONOSCOPY  2012   Dr. Elnoria Howard  . KNEE ARTHROSCOPY     right x 3  . SHOULDER ARTHROSCOPY     left x 2, and RTC        Home Medications    Prior to Admission medications   Medication Sig Start Date End Date Taking? Authorizing Provider  citalopram (CELEXA) 20 MG tablet 3 tablets po daily 03/26/16   Tysinger, Kermit Balo, PA-C  clonazePAM  (KLONOPIN) 0.5 MG tablet Take 1 tablet (0.5 mg total) by mouth 2 (two) times daily as needed for anxiety. 03/26/16   Tysinger, Kermit Balo, PA-C  lisinopril (PRINIVIL,ZESTRIL) 10 MG tablet Take 1 tablet (10 mg total) by mouth daily. 03/26/16   Tysinger, Kermit Balo, PA-C  meloxicam (MOBIC) 7.5 MG tablet Take 2 tablets (15 mg total) by mouth daily for 10 days. 08/09/17 08/19/17  Jeannie Fend, PA-C  testosterone cypionate (DEPOTESTOSTERONE CYPIONATE) 200 MG/ML injection INJECT EVERY 3 WEEKS 03/26/16   Tysinger, Kermit Balo, PA-C  traMADol (ULTRAM) 50 MG tablet Take 1 tablet (50 mg total) by mouth every 6 (six) hours as needed. 08/09/17   Jeannie Fend, PA-C    Family History Family History  Problem Relation Age of Onset  . Heart disease Mother        died of MI, diagnosed in 33s  . Alzheimer's disease Father   . Stroke Maternal Grandmother   . Cancer Neg Hx     Social History Social History   Tobacco Use  . Smoking status: Current Every Day Smoker    Packs/day: 1.00    Years: 40.00    Pack years: 40.00  . Smokeless tobacco: Never Used  Substance Use Topics  . Alcohol use: Yes    Alcohol/week: 6.0 oz    Types:  10 Glasses of wine per week    Comment: every day beers and then some wine  . Drug use: No     Allergies   Patient has no known allergies.   Review of Systems Review of Systems  Constitutional: Negative for fever.  Musculoskeletal: Positive for arthralgias, joint swelling and myalgias.  Skin: Negative for rash and wound.  Allergic/Immunologic: Negative for immunocompromised state.  Neurological: Negative for weakness and numbness.  Hematological: Negative for adenopathy. Does not bruise/bleed easily.  Psychiatric/Behavioral: Negative for confusion.  All other systems reviewed and are negative.    Physical Exam Updated Vital Signs BP (!) 131/96   Pulse (!) 55   Temp 98.3 F (36.8 C) (Oral)   Resp 16   Ht 5\' 7"  (1.702 m)   Wt 81.6 kg (180 lb)   SpO2 98%   BMI  28.19 kg/m   Physical Exam  Constitutional: He is oriented to person, place, and time. He appears well-developed and well-nourished. No distress.  HENT:  Head: Normocephalic and atraumatic.  Cardiovascular: Intact distal pulses.  Pulmonary/Chest: Effort normal.  Musculoskeletal: He exhibits tenderness. He exhibits no edema or deformity.       Right wrist: He exhibits decreased range of motion, tenderness and swelling. He exhibits no effusion, no crepitus, no deformity and no laceration.       Arms:      Right hand: He exhibits decreased range of motion, tenderness, decreased capillary refill and swelling. He exhibits no laceration. Normal sensation noted.       Hands: Neurological: He is alert and oriented to person, place, and time. No sensory deficit.  Skin: Skin is warm and dry. Capillary refill takes less than 2 seconds. No rash noted. He is not diaphoretic. No erythema.  Psychiatric: He has a normal mood and affect. His behavior is normal.  Nursing note and vitals reviewed.    ED Treatments / Results  Labs (all labs ordered are listed, but only abnormal results are displayed) Labs Reviewed - No data to display  EKG None  Radiology Dg Hand Complete Right  Result Date: 08/09/2017 CLINICAL DATA:  62 year old male with a history of hand pain EXAM: RIGHT HAND - COMPLETE 3+ VIEW COMPARISON:  12/15/2013 FINDINGS: Degenerative changes of the carpal bones including radiocarpal joint, and at the first carpometacarpal joint. No acute displaced fracture. No bony erosive changes. No subluxation/dislocation. Degenerative changes of the interphalangeal joints. No radiopaque foreign body. No focal soft tissue swelling. IMPRESSION: No acute bony abnormality. Degenerative changes of the carpal bones, radiocarpal joint, first carpometacarpal joint, and the interphalangeal joints. Electronically Signed   By: Gilmer Mor D.O.   On: 08/09/2017 11:47    Procedures Procedures (including critical  care time)  Medications Ordered in ED Medications - No data to display   Initial Impression / Assessment and Plan / ED Course  I have reviewed the triage vital signs and the nursing notes.  Pertinent labs & imaging results that were available during my care of the patient were reviewed by me and considered in my medical decision making (see chart for details).  Clinical Course as of Aug 09 1509  Sat Aug 09, 2017  454 62 year old male with pain and swelling to dorsum of right hand and wrist x2 days.  Onset after repetitive motion operating a saw, no diagnosis of arthritis, no relief with ibuprofen at home.  X-ray shows arthritis.  On exam there is no erythema or open wounds, no tenderness along the flexor surface  of the hand or wrist.  Patient was given meloxicam to take daily for the next 10 days for a tendinitis/arthritis exacerbation due to repetitive use of the hand.  Patient has a wrist brace that has been wearing which does help with his pain, recommend he continue wearing this.  Prescription for Ultram used for pain not controlled with meloxicam.  Review of controlled presence database, no recent prescriptions filled.  Patient to follow-up with his PCP or orthopedics.  Advised to monitor for signs of infection and return to ER for any worsening or concerning symptoms.   [LM]    Clinical Course User Index [LM] Jeannie Fend, PA-C    Final Clinical Impressions(s) / ED Diagnoses   Final diagnoses:  Swelling of right hand  Tendonitis of right hand    ED Discharge Orders        Ordered    meloxicam (MOBIC) 7.5 MG tablet  Daily     08/09/17 1503    traMADol (ULTRAM) 50 MG tablet  Every 6 hours PRN     08/09/17 1503       Jeannie Fend, PA-C 08/09/17 1511    Rolan Bucco, MD 08/09/17 1627

## 2017-09-19 ENCOUNTER — Encounter: Payer: Self-pay | Admitting: Medical

## 2017-09-19 ENCOUNTER — Other Ambulatory Visit: Payer: Self-pay | Admitting: Medical

## 2017-09-19 ENCOUNTER — Ambulatory Visit (INDEPENDENT_AMBULATORY_CARE_PROVIDER_SITE_OTHER): Payer: Self-pay | Admitting: Medical

## 2017-09-19 ENCOUNTER — Ambulatory Visit
Admission: RE | Admit: 2017-09-19 | Discharge: 2017-09-19 | Disposition: A | Discharge status: Home / Self Care | Payer: Self-pay | Source: Ambulatory Visit | Attending: Medical | Admitting: Medical

## 2017-09-19 VITALS — BP 130/90 | HR 55 | Temp 98.5°F | Resp 16 | Ht 67.0 in | Wt 169.0 lb

## 2017-09-19 DIAGNOSIS — R042 Hemoptysis: Secondary | ICD-10-CM

## 2017-09-19 DIAGNOSIS — R7989 Other specified abnormal findings of blood chemistry: Secondary | ICD-10-CM

## 2017-09-19 DIAGNOSIS — M19041 Primary osteoarthritis, right hand: Secondary | ICD-10-CM

## 2017-09-19 DIAGNOSIS — R059 Cough, unspecified: Secondary | ICD-10-CM

## 2017-09-19 DIAGNOSIS — R202 Paresthesia of skin: Secondary | ICD-10-CM

## 2017-09-19 DIAGNOSIS — E291 Testicular hypofunction: Secondary | ICD-10-CM

## 2017-09-19 DIAGNOSIS — R05 Cough: Secondary | ICD-10-CM

## 2017-09-19 DIAGNOSIS — F17200 Nicotine dependence, unspecified, uncomplicated: Secondary | ICD-10-CM

## 2017-09-19 DIAGNOSIS — I10 Essential (primary) hypertension: Secondary | ICD-10-CM

## 2017-09-19 DIAGNOSIS — M24541 Contracture, right hand: Secondary | ICD-10-CM

## 2017-09-19 DIAGNOSIS — R5383 Other fatigue: Secondary | ICD-10-CM

## 2017-09-19 DIAGNOSIS — M24542 Contracture, left hand: Secondary | ICD-10-CM

## 2017-09-19 DIAGNOSIS — M19042 Primary osteoarthritis, left hand: Secondary | ICD-10-CM

## 2017-09-19 HISTORY — DX: Other fatigue: R53.83

## 2017-09-19 HISTORY — DX: Contracture, right hand: M24.541

## 2017-09-19 HISTORY — DX: Other specified abnormal findings of blood chemistry: R79.89

## 2017-09-19 MED ORDER — FLUTICASONE FUROATE-VILANTEROL 200-25 MCG/INH IN AEPB: 1.0000 | INHALATION_SPRAY | Freq: Every day | RESPIRATORY_TRACT | 0 refills | Status: DC

## 2017-09-19 MED ORDER — LEVOFLOXACIN 500 MG PO TABS: 500.0000 mg | ORAL_TABLET | Freq: Every day | ORAL | 0 refills | Status: DC

## 2017-09-19 MED ORDER — MELOXICAM 15 MG PO TABS: 15.0000 mg | ORAL_TABLET | Freq: Every day | ORAL | 3 refills | Status: DC

## 2017-09-19 MED ORDER — AMITRIPTYLINE HCL 10 MG PO TABS: 10.0000 mg | ORAL_TABLET | Freq: Every day | ORAL | 2 refills | Status: DC

## 2017-09-19 MED ORDER — LISINOPRIL 10 MG PO TABS: 10.0000 mg | ORAL_TABLET | Freq: Every day | ORAL | 6 refills | Status: DC

## 2017-09-19 NOTE — Patient Instructions (Addendum)
Encounter Diagnoses  Name Primary?  . Fatigue, unspecified type Yes  . Tobacco use disorder   . Hypogonadism in male   . Essential hypertension   . Primary osteoarthritis of both hands   . Contracture of joint of both hands   . Blood in sputum   . Paresthesia of both hands   . Low testosterone   . Cough    Recommendations:  Fatigue in your case is likely due to a combination of things.  We are getting a chest x-ray and labs today to help evaluate  Begin sample of Breo inhaler 1 puff daily.  Rinse mouth on the water about 5 minutes after use.  Let us see if this changes any of your exercise tolerance or fatigue symptoms.  I recommend you begin back on blood pressure medicine to help control blood pressure.  This could contribute to fatigue  We will likely restart testosterone injections.  I am checking that lab today.  Obviously the low testosterone can contribute to fatigue  If agreeable I will refer you to orthopedics given the contracture of hands, likely carpal tunnel syndrome and arthritis  For now continue meloxicam and begin trial of amitriptyline to help with pain

## 2017-09-19 NOTE — Progress Notes (Signed)
Subjective: Chief Complaint  Patient presents with  . cough    coughing up bloody mucas X 1 week, no energy, achey   Hasn't been feeling well, has no energy, body aches.  Have trouble making it through the work day.   Almost has to take a nap when he gets home from work.   Could sleep for days he feels.     Noticed some pink coloration in phlegm this past week, taste of blood in mouth  He was in the shop using red colored wood, and thinks this may have been mixed in with mucous.  no fevers.   No obvious weight loss.   No problems with bowels or bladder, no blood in urine or stool.   He is a smoker, smokes <1 ppd.     Has hx/o abnormal sleep study in past, was given mouthpiece by dentist, Dr. Toni Arthurs.  Sleeps elevated, but declines current snoring, apnea, but has fatigue and somnolence  Wants to go back on TST. Did well on injection prior  Has contracture of hands that has gotten worse, can't fully stretch open pinky fingers.    Has constant numbness in hands at this point.   Long history of working in [employer].  Was medicating with alcohol but stopped this.   Had problems with narcotics and dependence prior.     Past Medical History:  Diagnosis Date  . Anger   . Anxiety   . Hypertension   . Osteoarthritis of hand   . Smoker   . Wears contact lenses    Current Outpatient Medications on File Prior to Visit  Medication Sig Dispense Refill  . testosterone cypionate (DEPOTESTOSTERONE CYPIONATE) 200 MG/ML injection INJECT EVERY 3 WEEKS (Patient not taking: Reported on 09/19/2017) 10 mL 1   No current facility-administered medications on file prior to visit.    ROS as in subjective    Objective: BP 130/90   Pulse (!) 55   Temp 98.5 F (36.9 C) (Oral)   Resp 16   Ht 5\' 7"  (1.702 m)   Wt 169 lb (76.7 kg)   SpO2 97%   BMI 26.47 kg/m    Wt Readings from Last 3 Encounters:  09/19/17 169 lb (76.7 kg)  08/09/17 180 lb (81.6 kg)  03/26/16 169 lb (76.7 kg)   General  appearance: alert, no distress, WD/WN,  HEENT: normocephalic, sclerae anicteric, TMs pearly, nares patent, no discharge or erythema, pharynx normal Oral cavity: MMM, no lesions Neck: supple, no lymphadenopathy, no thyromegaly, no masses, no bruits Heart: RRR, normal S1, S2, no murmurs Lungs: Decreased lung sounds in general, no wheezes, rhonchi, or rales Abdomen: +bs, soft, non tender, non distended, no masses, no hepatomegaly, no splenomegaly Pulses: 1+ symmetric, upper and lower extremities, normal cap refill Bony arthritic changes of DIPs and PIPs and MCPs of both hands, moderate contractures of bilateral fourth and fifth fingers on the volar surface, unable to fully extend fourth and fifth fingers bilaterally, no obvious swelling of the joints but tender over the wrist and hands in general No edema of LE    Assessment: Encounter Diagnoses  Name Primary?  . Fatigue, unspecified type Yes  . Tobacco use disorder   . Hypogonadism in male   . Essential hypertension   . Primary osteoarthritis of both hands   . Contracture of joint of both hands   . Blood in sputum   . Paresthesia of both hands   . Low testosterone   . Cough  Plan: Fatigue-discussed possible causes, could be multifactorial.  He is not been compliant with blood pressure medicine, has a history of low testosterone not on therapy in the past year.  He has a history of sleep apnea in the remote past but sleeps elevated and no major concern for sleep apnea symptoms right now other than fatigue.  Labs today, chest x-ray.  Smoker, decreased lung sounds-refuses spirometry.  Reviewed 2016 spirometry which was normal here.  We discussed the possibility of COPD playing a role but no obvious diagnosis.  We will send for chest x-ray given the blood in sputum and cough and symptoms.  Begin trial of Breo to see if any noted difference in symptoms of fatigue or energy  Paresthesias of hand, OA hands, contracture - advised referral  to ortho.  He will consider.  Low testosterone, hypogonadism - plan to restart IM testosterone as this worked well for him in the past  Tobacco - advised cessation  HTN - advised he restart medication  Hx/o sleep apnea - declines eval for CPAP treatment.   I reviewed the 2015 letter from his dentist that commented on a sleep study done by them showing sleep apnea.   Kinan was seen today for cough.  Diagnoses and all orders for this visit:  Fatigue, unspecified type -     Comprehensive metabolic panel -     CBC with Differential/Platelet -     TSH -     Testosterone -     PSA -     Sedimentation rate -     DG Chest 2 View; Future  Tobacco use disorder  Hypogonadism in male -     Testosterone  Essential hypertension -     Comprehensive metabolic panel  Primary osteoarthritis of both hands  Contracture of joint of both hands  Blood in sputum -     DG Chest 2 View; Future  Paresthesia of both hands  Low testosterone -     Testosterone  Cough  Other orders -     amitriptyline (ELAVIL) 10 MG tablet; Take 1 tablet (10 mg total) by mouth at bedtime. -     fluticasone furoate-vilanterol (BREO ELLIPTA) 200-25 MCG/INH AEPB; Inhale 1 puff into the lungs daily. -     meloxicam (MOBIC) 15 MG tablet; Take 1 tablet (15 mg total) by mouth daily. -     lisinopril (PRINIVIL,ZESTRIL) 10 MG tablet; Take 1 tablet (10 mg total) by mouth daily.

## 2017-09-20 LAB — CBC WITH DIFFERENTIAL/PLATELET
Basophils Absolute: 0.1 10*3/uL (ref 0.0–0.2)
Basos: 1 %
EOS (ABSOLUTE): 0.2 10*3/uL (ref 0.0–0.4)
Eos: 3 %
Hematocrit: 44.9 % (ref 37.5–51.0)
Hemoglobin: 14.8 g/dL (ref 13.0–17.7)
Immature Grans (Abs): 0 10*3/uL (ref 0.0–0.1)
Immature Granulocytes: 0 %
Lymphocytes Absolute: 1.4 10*3/uL (ref 0.7–3.1)
Lymphs: 14 %
MCH: 27.7 pg (ref 26.6–33.0)
MCHC: 33 g/dL (ref 31.5–35.7)
MCV: 84 fL (ref 79–97)
Monocytes Absolute: 0.6 10*3/uL (ref 0.1–0.9)
Monocytes: 6 %
Neutrophils Absolute: 7.2 10*3/uL — ABNORMAL HIGH (ref 1.4–7.0)
Neutrophils: 76 %
Platelets: 288 10*3/uL (ref 150–450)
RBC: 5.35 x10E6/uL (ref 4.14–5.80)
RDW: 13.4 % (ref 12.3–15.4)
WBC: 9.4 10*3/uL (ref 3.4–10.8)

## 2017-09-20 LAB — COMPREHENSIVE METABOLIC PANEL
ALT: 8 IU/L (ref 0–44)
AST: 9 IU/L (ref 0–40)
Albumin/Globulin Ratio: 1.4 (ref 1.2–2.2)
Albumin: 3.8 g/dL (ref 3.6–4.8)
Alkaline Phosphatase: 80 IU/L (ref 39–117)
BUN/Creatinine Ratio: 13 (ref 10–24)
BUN: 9 mg/dL (ref 8–27)
Bilirubin Total: 0.2 mg/dL (ref 0.0–1.2)
CO2: 21 mmol/L (ref 20–29)
Calcium: 9.1 mg/dL (ref 8.6–10.2)
Chloride: 101 mmol/L (ref 96–106)
Creatinine, Ser: 0.72 mg/dL — ABNORMAL LOW (ref 0.76–1.27)
GFR calc Af Amer: 117 mL/min/{1.73_m2} (ref >59)
GFR calc non Af Amer: 101 mL/min/{1.73_m2} (ref >59)
Globulin, Total: 2.8 g/dL (ref 1.5–4.5)
Glucose: 94 mg/dL (ref 65–99)
Potassium: 4.8 mmol/L (ref 3.5–5.2)
Sodium: 138 mmol/L (ref 134–144)
Total Protein: 6.6 g/dL (ref 6.0–8.5)

## 2017-09-20 LAB — TSH: TSH: 1.02 u[IU]/mL (ref 0.450–4.500)

## 2017-09-20 LAB — SEDIMENTATION RATE: Sed Rate: 32 mm/hr — ABNORMAL HIGH (ref 0–30)

## 2017-09-20 LAB — PSA: Prostate Specific Ag, Serum: <0.1 ng/mL (ref 0.0–4.0)

## 2017-09-20 LAB — TESTOSTERONE: Testosterone: 207 ng/dL — ABNORMAL LOW (ref 264–916)

## 2017-09-22 ENCOUNTER — Other Ambulatory Visit: Payer: Self-pay | Admitting: Medical

## 2017-09-22 ENCOUNTER — Other Ambulatory Visit: Payer: Self-pay

## 2017-09-22 DIAGNOSIS — R7989 Other specified abnormal findings of blood chemistry: Secondary | ICD-10-CM

## 2017-09-22 MED ORDER — AZITHROMYCIN 250 MG PO TABS
ORAL_TABLET | ORAL | 0 refills | Status: DC
Start: 2017-09-22 — End: 2019-04-08

## 2017-09-22 MED ORDER — TESTOSTERONE CYPIONATE 200 MG/ML IM SOLN: INTRAMUSCULAR | 2 refills | Status: DC

## 2018-04-04 ENCOUNTER — Other Ambulatory Visit: Payer: Self-pay | Admitting: Medical

## 2018-04-06 NOTE — Telephone Encounter (Signed)
Needs appt  Received refill on testosterone.   When I last saw him in 09/2017, I wanted to see him back in 6-8 weeks to recheck on testosterone and other issues from last visit.

## 2018-04-06 NOTE — Telephone Encounter (Signed)
Is this ok to refill?  

## 2018-04-06 NOTE — Telephone Encounter (Signed)
Per [place] all of shane's calls are to thru [facility] first

## 2018-04-07 NOTE — Telephone Encounter (Signed)
To C[facility] 

## 2018-04-07 NOTE — Telephone Encounter (Signed)
Patient states that he feels it is a waste of his money and time to come back in every time he needs a refill on this medication and that he can buy it off the black market if he has too.  Patient refused to make appointment.

## 2018-04-08 ENCOUNTER — Encounter: Payer: Self-pay | Admitting: Medical

## 2018-04-08 NOTE — Telephone Encounter (Signed)
Please mail letter I printed 04/08/18

## 2018-06-18 ENCOUNTER — Other Ambulatory Visit: Payer: Self-pay | Admitting: Medical

## 2018-06-19 NOTE — Telephone Encounter (Signed)
Is this ok to refill?  

## 2019-02-05 ENCOUNTER — Encounter: Payer: Self-pay | Admitting: Medical

## 2019-02-05 ENCOUNTER — Other Ambulatory Visit: Payer: Self-pay

## 2019-02-05 ENCOUNTER — Ambulatory Visit (INDEPENDENT_AMBULATORY_CARE_PROVIDER_SITE_OTHER): Payer: Self-pay | Admitting: Medical

## 2019-02-05 VITALS — BP 150/98 | HR 61 | Temp 98.0°F | Ht 68.0 in | Wt 174.8 lb

## 2019-02-05 DIAGNOSIS — S025XXB Fracture of tooth (traumatic), initial encounter for open fracture: Secondary | ICD-10-CM | POA: Insufficient documentation

## 2019-02-05 DIAGNOSIS — T148XXA Other injury of unspecified body region, initial encounter: Secondary | ICD-10-CM

## 2019-02-05 DIAGNOSIS — E291 Testicular hypofunction: Secondary | ICD-10-CM

## 2019-02-05 DIAGNOSIS — I10 Essential (primary) hypertension: Secondary | ICD-10-CM

## 2019-02-05 DIAGNOSIS — L089 Local infection of the skin and subcutaneous tissue, unspecified: Secondary | ICD-10-CM

## 2019-02-05 HISTORY — DX: Fracture of tooth (traumatic), initial encounter for open fracture: S02.5XXB

## 2019-02-05 MED ORDER — AMOXICILLIN-POT CLAVULANATE 875-125 MG PO TABS: 1.0000 | ORAL_TABLET | Freq: Two times a day (BID) | ORAL | 0 refills | Status: DC

## 2019-02-05 MED ORDER — MUPIROCIN 2 % EX OINT: 1.0000 "application " | TOPICAL_OINTMENT | Freq: Three times a day (TID) | CUTANEOUS | 1 refills | Status: DC

## 2019-02-05 MED ORDER — TESTOSTERONE CYPIONATE 200 MG/ML IM SOLN: 200.0000 mg | INTRAMUSCULAR | 4 refills | Status: DC

## 2019-02-05 MED ORDER — AMLODIPINE BESY-BENAZEPRIL HCL 5-20 MG PO CAPS: 1.0000 | ORAL_CAPSULE | Freq: Every day | ORAL | 2 refills | Status: DC

## 2019-02-05 NOTE — Progress Notes (Signed)
Subjective: Chief Complaint  Patient presents with  . Medication Management    wants to get on testosterone    Here for follow up.  Wants to get back on testosterone.  When using TST feels emergency, joints hurt less, feels mentally sharper.   Hasn't been on therapy in months.   Prefers injectable.   No prior adverse effects.   About a month ago got an ear infection.   Left ear had swollen up, was bright red.   Saw urgent care.  Was put on antibiotic and prednisone.   He notes discharge out of left ear x 1.5 month.   Even after antibiotic has still gotten discharge.    He also recently had a loose tooth , ended up pulling part of the tooth with players and after drinking moonshine.   still smoking  Compliant with lisinopril for BP, but home BPs stay elevated.  No chest pain, dyspnea, palpations.  No other aggravating or relieving factors. No other complaint.   Past Medical History:  Diagnosis Date  . Anger   . Anxiety   . Hypertension   . Osteoarthritis of hand   . Smoker   . Wears contact lenses    Current Outpatient Medications on File Prior to Visit  Medication Sig Dispense Refill  . amitriptyline (ELAVIL) 10 MG tablet Take 1 tablet (10 mg total) by mouth at bedtime. (Patient not taking: Reported on 02/05/2019) 30 tablet 2  . azithromycin (ZITHROMAX) 250 MG tablet 2 tablets day 1, then 1 tablet days 2-4 (Patient not taking: Reported on 02/05/2019) 6 tablet 0  . fluticasone furoate-vilanterol (BREO ELLIPTA) 200-25 MCG/INH AEPB Inhale 1 puff into the lungs daily. (Patient not taking: Reported on 02/05/2019) 1 each 0  . meloxicam (MOBIC) 15 MG tablet Take 1 tablet by mouth once daily (Patient not taking: Reported on 02/05/2019) 30 tablet 0  . testosterone cypionate (DEPOTESTOSTERONE CYPIONATE) 200 MG/ML injection INJECT EVERY 3 WEEKS (Patient not taking: Reported on 02/05/2019) 10 mL 2   No current facility-administered medications on file prior to visit.   ROS as in  subjective     Objective: BP (!) 150/98   Pulse 61   Temp 98 F (36.7 C)   Ht 5\' 8"  (1.727 m)   Wt 174 lb 12.8 oz (79.3 kg)   SpO2 97%   BMI 26.58 kg/m   BP Readings from Last 3 Encounters:  02/05/19 (!) 150/98  09/19/17 130/90  08/09/17 (!) 131/96    General appearence: alert, no distress, WD/WN, white male Left ear superior to tragus with small abrasion/break in skin with slight discharge, tragus generally with mild swelling and pink/red coloration HEENT: normocephalic, sclerae anicteric, TMs pearly, nares patent, no discharge or erythema, pharynx normal Oral cavity: MMM, no lesions Neck: supple, no lymphadenopathy, no thyromegaly, no masses Heart: RRR, normal S1, S2, no murmurs Lungs: CTA bilaterally, no wheezes, rhonchi, or rales Pulses: 2+ symmetric, upper and lower extremities, normal cap refill Ext: no edema    Assessment: Encounter Diagnoses  Name Primary?  . Skin infection Yes  . Abrasion of skin   . Open fracture of tooth, initial encounter   . Essential hypertension   . Hypogonadism in male      Plan: Skin infection, abrasion left ear - begin mupirocin ointment.  If worse redness/warmth again then add oral antibiotic below.    Tooth fracture - advised he see his dentist soon  HTN - not at goal.  Change to the medication below,  amlodipine benazepril.  Lab today  Hypogonadism, low T - restart injectable q3 weeks. Plan testosterone lab in 6-8 weeks.    Jaylin was seen today for medication management.  Diagnoses and all orders for this visit:  Skin infection  Abrasion of skin  Open fracture of tooth, initial encounter  Essential hypertension -     Comprehensive metabolic panel  Hypogonadism in male -     PSA  Other orders -     mupirocin ointment (BACTROBAN) 2 %; Apply 1 application topically 3 (three) times daily. -     amoxicillin-clavulanate (AUGMENTIN) 875-125 MG tablet; Take 1 tablet by mouth 2 (two) times daily. -      testosterone cypionate (DEPO-TESTOSTERONE) 200 MG/ML injection; Inject 1 mL (200 mg total) into the muscle every 21 ( twenty-one) days. -     amLODipine-benazepril (LOTREL) 5-20 MG capsule; Take 1 capsule by mouth daily.

## 2019-02-05 NOTE — Patient Instructions (Signed)
DENTIST RECOMMENDATION Dr. Riad Wagley Civils, dentist [address], [place], [place] [address] [phone] [email or web address]     

## 2019-02-06 LAB — COMPREHENSIVE METABOLIC PANEL
ALT: 16 IU/L (ref 0–44)
AST: 17 IU/L (ref 0–40)
Albumin/Globulin Ratio: 1.6 (ref 1.2–2.2)
Albumin: 4.3 g/dL (ref 3.8–4.8)
Alkaline Phosphatase: 79 IU/L (ref 39–117)
BUN/Creatinine Ratio: 12 (ref 10–24)
BUN: 11 mg/dL (ref 8–27)
Bilirubin Total: 0.6 mg/dL (ref 0.0–1.2)
CO2: 24 mmol/L (ref 20–29)
Calcium: 9 mg/dL (ref 8.6–10.2)
Chloride: 101 mmol/L (ref 96–106)
Creatinine, Ser: 0.93 mg/dL (ref 0.76–1.27)
GFR calc Af Amer: 102 mL/min/{1.73_m2} (ref >59)
GFR calc non Af Amer: 88 mL/min/{1.73_m2} (ref >59)
Globulin, Total: 2.7 g/dL (ref 1.5–4.5)
Glucose: 83 mg/dL (ref 65–99)
Potassium: 4.3 mmol/L (ref 3.5–5.2)
Sodium: 140 mmol/L (ref 134–144)
Total Protein: 7 g/dL (ref 6.0–8.5)

## 2019-02-06 LAB — PSA: Prostate Specific Ag, Serum: 0.1 ng/mL (ref 0.0–4.0)

## 2019-04-08 ENCOUNTER — Encounter: Payer: Self-pay | Admitting: Medical

## 2019-04-08 ENCOUNTER — Other Ambulatory Visit: Payer: Self-pay

## 2019-04-08 ENCOUNTER — Ambulatory Visit (INDEPENDENT_AMBULATORY_CARE_PROVIDER_SITE_OTHER): Payer: Self-pay | Admitting: Medical

## 2019-04-08 VITALS — BP 148/100 | HR 64 | Temp 98.4°F | Ht 68.0 in | Wt 168.2 lb

## 2019-04-08 DIAGNOSIS — E291 Testicular hypofunction: Secondary | ICD-10-CM

## 2019-04-08 DIAGNOSIS — R5383 Other fatigue: Secondary | ICD-10-CM

## 2019-04-08 DIAGNOSIS — R7989 Other specified abnormal findings of blood chemistry: Secondary | ICD-10-CM

## 2019-04-08 DIAGNOSIS — M19042 Primary osteoarthritis, left hand: Secondary | ICD-10-CM

## 2019-04-08 DIAGNOSIS — M19041 Primary osteoarthritis, right hand: Secondary | ICD-10-CM

## 2019-04-08 DIAGNOSIS — I10 Essential (primary) hypertension: Secondary | ICD-10-CM

## 2019-04-08 MED ORDER — MELOXICAM 15 MG PO TABS: 15.0000 mg | ORAL_TABLET | Freq: Every day | ORAL | 3 refills | Status: DC

## 2019-04-08 MED ORDER — LISINOPRIL 20 MG PO TABS: 20.0000 mg | ORAL_TABLET | Freq: Every day | ORAL | 3 refills | Status: DC

## 2019-04-08 MED ORDER — AMLODIPINE BESYLATE 5 MG PO TABS: 5.0000 mg | ORAL_TABLET | Freq: Every day | ORAL | 3 refills | Status: DC

## 2019-04-08 NOTE — Progress Notes (Signed)
Subjective: Chief Complaint  Patient presents with  . Follow-up    bp and testosterone    Here for follow up.  I saw him in late January 2021 to restart medicaiton.    Last visit he restarted testosterone therapy, injecting q3 weeks.  Here for f/u and lab today.   Seems to be getting improvements in energy on testosterone therapy.  Injects in the upper lateral thigh.  Has some soreness the day after injection but no other issues, no sign of infection.   HTN - last visit we planned to start Amlodipine Benazepril 5/20mg  daily.  This was too expensive so he is still taking his Lisinopril 10mg  daily.   Doesn't check BP at home.  No chest pain, dyspnea, palpations.  He saw dermatology recently and they diagnosed his ear rash as dermatitis. The prescribed a cream that cleared it up.  No other aggravating or relieving factors. No other complaint.   Past Medical History:  Diagnosis Date  . Anger   . Anxiety   . Hypertension   . Osteoarthritis of hand   . Smoker   . Wears contact lenses    Current Outpatient Medications on File Prior to Visit  Medication Sig Dispense Refill  . testosterone cypionate (DEPO-TESTOSTERONE) 200 MG/ML injection Inject 1 mL (200 mg total) into the muscle every 21 ( twenty-one) days. 1 mL 4   No current facility-administered medications on file prior to visit.   ROS as in subjective     Objective: BP (!) 148/100   Pulse 64   Temp 98.4 F (36.9 C)   Ht 5\' 8"  (1.727 m)   Wt 168 lb 3.2 oz (76.3 kg)   SpO2 99%   BMI 25.57 kg/m   BP Readings from Last 3 Encounters:  04/08/19 (!) 148/100  02/05/19 (!) 150/98  09/19/17 130/90   Wt Readings from Last 3 Encounters:  04/08/19 168 lb 3.2 oz (76.3 kg)  02/05/19 174 lb 12.8 oz (79.3 kg)  09/19/17 169 lb (76.7 kg)    General appearence: alert, no distress, WD/WN, white male Heart: RRR, normal S1, S2, no murmurs Lungs: CTA bilaterally, no wheezes, rhonchi, or rales Pulses: 2+ symmetric, upper and lower  extremities, normal cap refill Ext: no edema     Assessment: Encounter Diagnoses  Name Primary?  . Essential hypertension Yes  . Hypogonadism in male   . Primary osteoarthritis of both hands   . Low testosterone   . Fatigue, unspecified type      Plan: HTN - advised he call back if any medication too expensive.   Increase Lisinopril to 20mg  daily and add Amlodipine 5mg  daily.  Discussed risk/benefits of medication.  Call report of BP readings in 1 mo  Hypogonadism, low T - lab today since starting back on Testosterone therapy q 3 weeks  OA - continue meloxicam prn.   Alison was seen today for follow-up.  Diagnoses and all orders for this visit:  Essential hypertension  Hypogonadism in male -     Testosterone  Primary osteoarthritis of both hands  Low testosterone -     Testosterone  Fatigue, unspecified type  Other orders -     amLODipine (NORVASC) 5 MG tablet; Take 1 tablet (5 mg total) by mouth daily. -     lisinopril (ZESTRIL) 20 MG tablet; Take 1 tablet (20 mg total) by mouth daily. -     meloxicam (MOBIC) 15 MG tablet; Take 1 tablet (15 mg total) by mouth daily.

## 2019-04-09 ENCOUNTER — Other Ambulatory Visit: Payer: Self-pay | Admitting: Medical

## 2019-04-09 LAB — TESTOSTERONE: Testosterone: 136 ng/dL — ABNORMAL LOW (ref 264–916)

## 2019-04-09 MED ORDER — TESTOSTERONE CYPIONATE 200 MG/ML IM SOLN: 200.0000 mg | INTRAMUSCULAR | 5 refills | Status: DC

## 2019-08-24 ENCOUNTER — Other Ambulatory Visit: Payer: Self-pay | Admitting: Medical

## 2019-08-25 NOTE — Telephone Encounter (Signed)
[facility] is requesting to fill pt testosterone . Please advise KH 

## 2019-08-25 NOTE — Telephone Encounter (Signed)
Appt made Kh 

## 2019-08-25 NOTE — Telephone Encounter (Signed)
He needs a follow-up appointment

## 2019-09-08 ENCOUNTER — Ambulatory Visit (INDEPENDENT_AMBULATORY_CARE_PROVIDER_SITE_OTHER): Payer: Self-pay | Admitting: Medical

## 2019-09-08 ENCOUNTER — Other Ambulatory Visit: Payer: Self-pay

## 2019-09-08 ENCOUNTER — Encounter: Payer: Self-pay | Admitting: Medical

## 2019-09-08 VITALS — BP 136/84 | HR 73 | Ht 68.0 in | Wt 176.0 lb

## 2019-09-08 DIAGNOSIS — M19042 Primary osteoarthritis, left hand: Secondary | ICD-10-CM

## 2019-09-08 DIAGNOSIS — R7989 Other specified abnormal findings of blood chemistry: Secondary | ICD-10-CM

## 2019-09-08 DIAGNOSIS — L409 Psoriasis, unspecified: Secondary | ICD-10-CM

## 2019-09-08 DIAGNOSIS — M19041 Primary osteoarthritis, right hand: Secondary | ICD-10-CM

## 2019-09-08 DIAGNOSIS — I10 Essential (primary) hypertension: Secondary | ICD-10-CM

## 2019-09-08 DIAGNOSIS — E291 Testicular hypofunction: Secondary | ICD-10-CM

## 2019-09-08 HISTORY — DX: Psoriasis, unspecified: L40.9

## 2019-09-08 MED ORDER — PRAVASTATIN SODIUM 20 MG PO TABS: 20.0000 mg | ORAL_TABLET | Freq: Every day | ORAL | 3 refills | Status: DC

## 2019-09-08 MED ORDER — TESTOSTERONE CYPIONATE 200 MG/ML IM SOLN: INTRAMUSCULAR | 5 refills | Status: DC

## 2019-09-08 NOTE — Progress Notes (Signed)
Subjective:  Troy Chapman is a 64 y.o. male who presents for Chief Complaint  Patient presents with  . Medication Management     Here today for med check  Low testosterone-using testosterone injections at home every 3 weeks.  He feels like it works well gives him good energy.  No particular side effects.  Hypertension-he is taking lisinopril but not amlodipine.  He thought from last visit he was supposed to switch to lisinopril only.  He continues to smoke  He recently established with [facility] rheumatology for psoriasis.  They are contemplating using Enbrel.  He just recently had a whole bunch of labs.  No other aggravating or relieving factors.    No other c/o.  Past Medical History:  Diagnosis Date  . Anger   . Anxiety   . Hypertension   . Osteoarthritis of hand   . Smoker   . Wears contact lenses    Current Outpatient Medications on File Prior to Visit  Medication Sig Dispense Refill  . amLODipine (NORVASC) 5 MG tablet Take 1 tablet (5 mg total) by mouth daily. 90 tablet 3  . lisinopril (ZESTRIL) 20 MG tablet Take 1 tablet (20 mg total) by mouth daily. 90 tablet 3  . meloxicam (MOBIC) 15 MG tablet Take 1 tablet (15 mg total) by mouth daily. 30 tablet 3   No current facility-administered medications on file prior to visit.     The following portions of the patient's history were reviewed and updated as appropriate: allergies, current medications, past family history, past medical history, past social history, past surgical history and problem list.  ROS Otherwise as in subjective above    Objective: BP 136/84   Pulse 73   Ht 5\' 8"  (1.727 m)   Wt 176 lb (79.8 kg)   SpO2 93%   BMI 26.76 kg/m   General appearance: alert, no distress, well developed, well nourished Heart: RRR, normal S1, S2, no murmurs Lungs: CTA bilaterally, no wheezes, rhonchi, or rales Pulses: 2+ radial pulses, 2+ pedal pulses, normal cap refill Ext: no  edema    Assessment: Encounter Diagnoses  Name Primary?  . Essential hypertension Yes  . Hypogonadism in male   . Primary osteoarthritis of both hands   . Psoriasis   . Low testosterone      Plan: Hypertension-I clarified his medications from last visit.  I advise he needs to take lisinopril plus amlodipine.  Restart amlodipine  Hypogonadism-continue testosterone therapy every 3 weeks IM injections.  Return in 1 week after next injection to check a peak level  Psoriasis, primary osteoarthritis-request records from [facility] rheumatology including recent labs and office notes  We discussed his risk factors for heart disease.  Begin statin for primary prevention  Troy Chapman was seen today for medication management.  Diagnoses and all orders for this visit:  Essential hypertension  Hypogonadism in male -     Testosterone; Future  Primary osteoarthritis of both hands  Psoriasis  Low testosterone -     Testosterone; Future  Other orders -     testosterone cypionate (DEPOTESTOSTERONE CYPIONATE) 200 MG/ML injection; INJECT Troy Chapman INTO THE MUSCLE EVERY 21 DAYS -     pravastatin (PRAVACHOL) 20 MG tablet; Take 1 tablet (20 mg total) by mouth at bedtime.    Follow up: return in 1 week for lab with nurse

## 2019-09-28 ENCOUNTER — Encounter: Payer: Self-pay | Admitting: Medical

## 2019-10-12 ENCOUNTER — Encounter: Payer: Self-pay | Admitting: Medical

## 2020-01-28 ENCOUNTER — Emergency Department (HOSPITAL_COMMUNITY): Payer: Self-pay

## 2020-01-28 ENCOUNTER — Other Ambulatory Visit: Payer: Self-pay

## 2020-01-28 ENCOUNTER — Encounter (HOSPITAL_COMMUNITY): Payer: Self-pay | Admitting: Emergency Medicine

## 2020-01-28 ENCOUNTER — Emergency Department (HOSPITAL_COMMUNITY)
Admission: EM | Admit: 2020-01-28 | Discharge: 2020-01-29 | Disposition: A | Discharge status: Home / Self Care | Payer: Self-pay | Attending: Emergency Medicine | Admitting: Emergency Medicine

## 2020-01-28 DIAGNOSIS — Y9289 Other specified places as the place of occurrence of the external cause: Secondary | ICD-10-CM | POA: Insufficient documentation

## 2020-01-28 DIAGNOSIS — F17200 Nicotine dependence, unspecified, uncomplicated: Secondary | ICD-10-CM | POA: Insufficient documentation

## 2020-01-28 DIAGNOSIS — Y9301 Activity, walking, marching and hiking: Secondary | ICD-10-CM | POA: Insufficient documentation

## 2020-01-28 DIAGNOSIS — S42292A Other displaced fracture of upper end of left humerus, initial encounter for closed fracture: Secondary | ICD-10-CM | POA: Insufficient documentation

## 2020-01-28 DIAGNOSIS — Z79899 Other long term (current) drug therapy: Secondary | ICD-10-CM | POA: Insufficient documentation

## 2020-01-28 DIAGNOSIS — I10 Essential (primary) hypertension: Secondary | ICD-10-CM | POA: Insufficient documentation

## 2020-01-28 DIAGNOSIS — W130XXA Fall from, out of or through balcony, initial encounter: Secondary | ICD-10-CM | POA: Insufficient documentation

## 2020-01-28 MED ORDER — FENTANYL CITRATE (PF) 100 MCG/2ML IJ SOLN
50.0000 ug | Freq: Once | INTRAMUSCULAR | Status: AC
  Administered 2020-01-29: 50 ug via INTRAVENOUS
  Filled 2020-01-28: qty 2

## 2020-01-28 NOTE — ED Notes (Signed)
Patient transported to X-ray 

## 2020-01-28 NOTE — ED Provider Notes (Signed)
[facility]-EMERGENCY DEPT Provider Note   CSN: [id number] Arrival date & time: 01/28/20  2323   Time seen 11:35 PM  History Chief Complaint  Patient presents with  . Shoulder Pain    Troy Chapman is a 65 y.o. male.  HPI   Patient states he fell off his porch tonight.  He states there is no railing.  He cannot tell me why he fell off the porch.  He denies hitting his head or having loss of consciousness.  He only complains of pain in his "left shoulder".  However on exam of the patient is actually his left shoulder area or proximal humerus.  Pt is right handed.   PCP Tysinger, Kermit Balo, PA-C    Past Medical History:  Diagnosis Date  . Anger   . Anxiety   . Hypertension   . Osteoarthritis of hand   . Smoker   . Wears contact lenses     Patient Active Problem List   Diagnosis Date Noted  . Psoriasis 09/08/2019  . Open fracture of tooth 02/05/2019  . Fatigue 09/19/2017  . Contracture of joint of both hands 09/19/2017  . Low testosterone 09/19/2017  . Primary osteoarthritis of both hands 12/21/2014  . Screening for cancer 12/21/2014  . Hypogonadism in male 12/21/2014  . Essential hypertension 12/15/2013  . Paresthesia of both hands 12/15/2013  . Generalized anxiety disorder 04/30/2013  . Tobacco use disorder 04/30/2013    Past Surgical History:  Procedure Laterality Date  . COLONOSCOPY  2012   Dr. Elnoria Howard  . KNEE ARTHROSCOPY     right x 3  . SHOULDER ARTHROSCOPY     left x 2, and RTC       Family History  Problem Relation Age of Onset  . Heart disease Mother        died of MI, diagnosed in 62s  . Alzheimer's disease Father   . Stroke Maternal Grandmother   . Cancer Neg Hx     Social History   Tobacco Use  . Smoking status: Current Every Day Smoker    Packs/day: 1.00    Years: 40.00    Pack years: 40.00  . Smokeless tobacco: Never Used  Substance Use Topics  . Alcohol use: Yes    Alcohol/week: 10.0 standard drinks     Types: 10 Glasses of wine per week    Comment: every day beers and then some wine  . Drug use: No    Home Medications Prior to Admission medications   Medication Sig Start Date End Date Taking? Authorizing Provider  PERCOCET 5-325 MG tablet Take 1-2 tablets by mouth every 6 (six) hours as needed for moderate pain or severe pain. 01/29/20  Yes Devoria Albe, MD  amLODipine (NORVASC) 5 MG tablet Take 1 tablet (5 mg total) by mouth daily. 04/08/19   Tysinger, Kermit Balo, PA-C  lisinopril (ZESTRIL) 20 MG tablet Take 1 tablet (20 mg total) by mouth daily. 04/08/19   Tysinger, Kermit Balo, PA-C  meloxicam (MOBIC) 15 MG tablet Take 1 tablet (15 mg total) by mouth daily. 04/08/19   Tysinger, Kermit Balo, PA-C  pravastatin (PRAVACHOL) 20 MG tablet Take 1 tablet (20 mg total) by mouth at bedtime. 09/08/19   Tysinger, Kermit Balo, PA-C  testosterone cypionate (DEPOTESTOSTERONE CYPIONATE) 200 MG/ML injection INJECT INTO THE MUSCLE EVERY 21 DAYS 09/08/19   Tysinger, Kermit Balo, PA-C    Allergies    Gabapentin  Review of Systems   Review of Systems  All other systems reviewed and are negative.   Physical Exam Updated Vital Signs BP (!) 125/96   Pulse 62   Temp 98.7 F (37.1 C)   Resp 18   SpO2 97%   Physical Exam Vitals and nursing note reviewed.  Constitutional:      General: He is in acute distress.     Appearance: Normal appearance. He is normal weight. He is not ill-appearing or toxic-appearing.     Comments:  appears uncomfortable  HENT:     Head: Normocephalic and atraumatic.     Right Ear: External ear normal.     Left Ear: External ear normal.  Eyes:     Extraocular Movements: Extraocular movements intact.     Conjunctiva/sclera: Conjunctivae normal.     Pupils: Pupils are equal, round, and reactive to light.  Cardiovascular:     Rate and Rhythm: Normal rate.  Pulmonary:     Effort: Pulmonary effort is normal. No respiratory distress.  Musculoskeletal:     Cervical back: Normal range of  motion.     Comments: Patient has intact sensation in his left hand, he has normal grips.  He has intact motor function to the radial ulnar and median nerves.  Skin:    General: Skin is warm and dry.  Neurological:     General: No focal deficit present.     Mental Status: He is alert and oriented to person, place, and time.     Cranial Nerves: No cranial nerve deficit.  Psychiatric:        Mood and Affect: Mood normal.        Behavior: Behavior normal.        Thought Content: Thought content normal.     ED Results / Procedures / Treatments   Labs (all labs ordered are listed, but only abnormal results are displayed) Labs Reviewed - No data to display  EKG None  Radiology DG Shoulder Left  Result Date: 01/29/2020 CLINICAL DATA:  Shoulder pain after fall EXAM: LEFT SHOULDER - 2+ VIEW; LEFT HUMERUS - 2+ VIEW COMPARISON:  None. FINDINGS: There is comminuted mildly displaced impacted fracture seen through the proximal humerus shaft. There is slight medial displacement of the proximal shaft fracture fragment. The humeral head is still well seen within the glenoid. Overlying soft tissue swelling is seen. IMPRESSION: Comminuted displaced proximal humerus shaft fracture. Electronically Signed   By: Jonna Clark M.D.   On: 01/29/2020 00:06   DG Humerus Left  Result Date: 01/29/2020 CLINICAL DATA:  Shoulder pain after fall EXAM: LEFT SHOULDER - 2+ VIEW; LEFT HUMERUS - 2+ VIEW COMPARISON:  None. FINDINGS: There is comminuted mildly displaced impacted fracture seen through the proximal humerus shaft. There is slight medial displacement of the proximal shaft fracture fragment. The humeral head is still well seen within the glenoid. Overlying soft tissue swelling is seen. IMPRESSION: Comminuted displaced proximal humerus shaft fracture. Electronically Signed   By: Jonna Clark M.D.   On: 01/29/2020 00:06    Procedures Procedures (including critical care time)  Medications Ordered in  ED Medications  fentaNYL (SUBLIMAZE) injection 50 mcg (50 mcg Intravenous Given 01/29/20 0010)  fentaNYL (SUBLIMAZE) injection 50 mcg (50 mcg Intravenous Given 01/29/20 0106)  morphine 4 MG/ML injection 4 mg (4 mg Intravenous Given 01/29/20 0109)  ketorolac (TORADOL) 30 MG/ML injection 30 mg (30 mg Intravenous Given 01/29/20 0108)    ED Course  I have reviewed the triage vital signs and the nursing notes.  Pertinent labs &  imaging results that were available during my care of the patient were reviewed by me and considered in my medical decision making (see chart for details).    MDM Rules/Calculators/A&P                          IV was inserted and patient was given IV pain medication.  After reviewing his x-rays he was placed in a long-arm posterior splint and a sling.  He will be referred to orthopedics.  Patient states he has seen Dr. Eulah Pont in the past, his partner Dr. Dion Saucier is on-call tonight.   Final Clinical Impression(s) / ED Diagnoses Final diagnoses:  Other closed displaced fracture of proximal end of left humerus, initial encounter    Rx / DC Orders ED Discharge Orders         Ordered    PERCOCET 5-325 MG tablet  Every 6 hours PRN        01/29/20 0113         Plan discharge  Devoria Albe, MD, Concha Pyo, MD 01/29/20 [phone]

## 2020-01-28 NOTE — ED Triage Notes (Signed)
Pt was walking up his steps of the porch. Larey Seat to the left and landed on his L shoulder. NO LOC, no head injury, no blood thinners. Reports severe pain. Deformity noted.

## 2020-01-29 MED ORDER — FENTANYL CITRATE (PF) 100 MCG/2ML IJ SOLN
50.0000 ug | Freq: Once | INTRAMUSCULAR | Status: AC
  Administered 2020-01-29: 50 ug via INTRAVENOUS
  Filled 2020-01-29: qty 2

## 2020-01-29 MED ORDER — MORPHINE SULFATE (PF) 4 MG/ML IV SOLN
4.0000 mg | Freq: Once | INTRAVENOUS | Status: AC
  Administered 2020-01-29: 4 mg via INTRAVENOUS
  Filled 2020-01-29: qty 1

## 2020-01-29 MED ORDER — PERCOCET 5-325 MG PO TABS: 1.0000 | ORAL_TABLET | Freq: Four times a day (QID) | ORAL | 0 refills | Status: DC | PRN

## 2020-01-29 MED ORDER — KETOROLAC TROMETHAMINE 30 MG/ML IJ SOLN
30.0000 mg | Freq: Once | INTRAMUSCULAR | Status: AC
  Administered 2020-01-29: 30 mg via INTRAVENOUS
  Filled 2020-01-29: qty 1

## 2020-01-29 NOTE — Discharge Instructions (Addendum)
You will need to call Dr. Cydney Ok office on Monday, January 17 to get a follow-up appointment.  You have a "comminuted displaced proximal humerus shaft fracture".  Wear the splint until you are evaluated.  Take the pain medications as prescribed.  Return to the emergency department if your fingers get cold or painful, you get numbness in your hand.

## 2020-01-29 NOTE — ED Notes (Signed)
Ortho tech at pt bedside 

## 2020-02-02 ENCOUNTER — Encounter (HOSPITAL_BASED_OUTPATIENT_CLINIC_OR_DEPARTMENT_OTHER): Payer: Self-pay | Admitting: Orthopedic Surgery

## 2020-02-02 ENCOUNTER — Ambulatory Visit
Admission: RE | Admit: 2020-02-02 | Discharge: 2020-02-02 | Disposition: A | Discharge status: Home / Self Care | Payer: Self-pay | Source: Ambulatory Visit | Attending: Orthopedic Surgery | Admitting: Orthopedic Surgery

## 2020-02-02 ENCOUNTER — Other Ambulatory Visit: Payer: Self-pay

## 2020-02-02 ENCOUNTER — Other Ambulatory Visit (HOSPITAL_COMMUNITY)
Admission: RE | Admit: 2020-02-02 | Discharge: 2020-02-02 | Disposition: A | Discharge status: Home / Self Care | Payer: No Typology Code available for payment source | Source: Ambulatory Visit | Attending: Orthopedic Surgery | Admitting: Orthopedic Surgery

## 2020-02-02 ENCOUNTER — Other Ambulatory Visit: Payer: Self-pay | Admitting: Orthopedic Surgery

## 2020-02-02 DIAGNOSIS — M25512 Pain in left shoulder: Secondary | ICD-10-CM

## 2020-02-02 DIAGNOSIS — Z01812 Encounter for preprocedural laboratory examination: Secondary | ICD-10-CM | POA: Insufficient documentation

## 2020-02-02 DIAGNOSIS — Z20822 Contact with and (suspected) exposure to covid-19: Secondary | ICD-10-CM | POA: Insufficient documentation

## 2020-02-02 LAB — SARS CORONAVIRUS 2 (TAT 6-24 HRS): SARS Coronavirus 2: NEGATIVE

## 2020-02-02 NOTE — H&P (Signed)
HPI:  Pt. States that he fell off of his porch on 01-28-20 due to not having a railing. He deneis LOC or other trauma. Pt. States that his pain in the left shoulder has improved. Pt. Sent to the ER where he was splinted. Pt. Is right hand dominant.   Past Medical History:  Diagnosis Date  . Anger   . Anxiety   . Hypertension   . Osteoarthritis of hand   . Smoker   . Wears contact lenses      Past Surgical History:  Procedure Laterality Date  . COLONOSCOPY  2012   Dr. Elnoria Howard  . KNEE ARTHROSCOPY     right x 3  . SHOULDER ARTHROSCOPY     left x 2, and RTC     Family History  Problem Relation Age of Onset  . Heart disease Mother        died of MI, diagnosed in 78s  . Alzheimer's disease Father   . Stroke Maternal Grandmother   . Cancer Neg Hx      Current Medications No current facility-administered medications for this encounter.   Marland Kitchen amLODipine (NORVASC) 5 MG tablet  . lisinopril (ZESTRIL) 20 MG tablet  . meloxicam (MOBIC) 15 MG tablet  . PERCOCET 5-325 MG tablet  . pravastatin (PRAVACHOL) 20 MG tablet  . testosterone cypionate (DEPOTESTOSTERONE CYPIONATE) 200 MG/ML injection     Allergies Gabapentin  Social History  reports that he has been smoking. He has a 40.00 pack-year smoking history. He has never used smokeless tobacco. He reports current alcohol use of about 10.0 standard drinks of alcohol per week. He reports that he does not use drugs.    Physical Exam General: Well appearing, in NAD Mental status: Alert and Oriented x3 Lungs: CTA b/l anterior and posterior without crackles or wheeze Heart: RRR, no m/g/r appreciated Abdomen: +BS, soft, nt, nd, no masses, hernias, or organomegaly appreciated Neurological: Speech Clear and organized, no gross focal findings or movement disorder appreciated Musculoskeletal: Patient has intact sensation in his left hand, he has normal grips.  He has intact motor function to the radial ulnar and median nerves.  Extremities:  Warm and well perfused w/o edema Skin: Warm and dry    DG Shoulder Left  Result Date: 01/29/2020 CLINICAL DATA:  Shoulder pain after fall EXAM: LEFT SHOULDER - 2+ VIEW; LEFT HUMERUS - 2+ VIEW COMPARISON:  None. FINDINGS: There is comminuted mildly displaced impacted fracture seen through the proximal humerus shaft. There is slight medial displacement of the proximal shaft fracture fragment. The humeral head is still well seen within the glenoid. Overlying soft tissue swelling is seen. IMPRESSION: Comminuted displaced proximal humerus shaft fracture. Electronically Signed   By: Jonna Clark M.D.   On: 01/29/2020 00:06     A/P Left humoral fracture  Will plan for ORIF of the left humerus. Risks, benefits, alternatives were discussed at length with the pt. Who would like to proceed with the plan.

## 2020-02-03 NOTE — Anesthesia Preprocedure Evaluation (Addendum)
Anesthesia Evaluation  Patient identified by MRN, date of birth, ID band Patient awake    Reviewed: Allergy & Precautions, NPO status , Patient's Chart, lab work & pertinent test results  History of Anesthesia Complications Negative for: history of anesthetic complications  Airway Mallampati: II  TM Distance: >3 FB Neck ROM: Full    Dental no notable dental hx.    Pulmonary Current Smoker,    Pulmonary exam normal        Cardiovascular hypertension, Pt. on medications Normal cardiovascular exam     Neuro/Psych Anxiety negative neurological ROS     GI/Hepatic negative GI ROS, Neg liver ROS,   Endo/Other  negative endocrine ROS  Renal/GU negative Renal ROS  negative genitourinary   Musculoskeletal  (+) Arthritis ,   Abdominal   Peds  Hematology negative hematology ROS (+)   Anesthesia Other Findings Day of surgery medications reviewed with patient.  Reproductive/Obstetrics negative OB ROS                            Anesthesia Physical Anesthesia Plan  ASA: II  Anesthesia Plan: General   Post-op Pain Management: GA combined w/ Regional for post-op pain   Induction: Intravenous  PONV Risk Score and Plan: 1 and Treatment may vary due to age or medical condition, Midazolam, Ondansetron and Dexamethasone  Airway Management Planned: Oral ETT  Additional Equipment: None  Intra-op Plan:   Post-operative Plan: Extubation in OR  Informed Consent: I have reviewed the patients History and Physical, chart, labs and discussed the procedure including the risks, benefits and alternatives for the proposed anesthesia with the patient or authorized representative who has indicated his/her understanding and acceptance.     Dental advisory given  Plan Discussed with: CRNA  Anesthesia Plan Comments:        Anesthesia Quick Evaluation

## 2020-02-04 ENCOUNTER — Encounter (HOSPITAL_BASED_OUTPATIENT_CLINIC_OR_DEPARTMENT_OTHER): Payer: Self-pay | Admitting: Orthopedic Surgery

## 2020-02-04 ENCOUNTER — Ambulatory Visit (HOSPITAL_BASED_OUTPATIENT_CLINIC_OR_DEPARTMENT_OTHER): Payer: Self-pay | Admitting: Anesthesiology

## 2020-02-04 ENCOUNTER — Ambulatory Visit (HOSPITAL_BASED_OUTPATIENT_CLINIC_OR_DEPARTMENT_OTHER)
Admission: RE | Admit: 2020-02-04 | Discharge: 2020-02-04 | Disposition: A | Discharge status: Home / Self Care | Payer: Self-pay | Attending: Orthopedic Surgery | Admitting: Orthopedic Surgery

## 2020-02-04 ENCOUNTER — Ambulatory Visit (HOSPITAL_COMMUNITY): Payer: Self-pay

## 2020-02-04 ENCOUNTER — Encounter (HOSPITAL_BASED_OUTPATIENT_CLINIC_OR_DEPARTMENT_OTHER)
Admission: RE | Disposition: A | Discharge status: Home / Self Care | Payer: Self-pay | Source: Home / Self Care | Attending: Orthopedic Surgery

## 2020-02-04 ENCOUNTER — Other Ambulatory Visit: Payer: Self-pay

## 2020-02-04 DIAGNOSIS — F17200 Nicotine dependence, unspecified, uncomplicated: Secondary | ICD-10-CM | POA: Insufficient documentation

## 2020-02-04 DIAGNOSIS — Z888 Allergy status to other drugs, medicaments and biological substances status: Secondary | ICD-10-CM | POA: Insufficient documentation

## 2020-02-04 DIAGNOSIS — W1789XA Other fall from one level to another, initial encounter: Secondary | ICD-10-CM | POA: Insufficient documentation

## 2020-02-04 DIAGNOSIS — S42302A Unspecified fracture of shaft of humerus, left arm, initial encounter for closed fracture: Secondary | ICD-10-CM | POA: Insufficient documentation

## 2020-02-04 DIAGNOSIS — Z419 Encounter for procedure for purposes other than remedying health state, unspecified: Secondary | ICD-10-CM

## 2020-02-04 HISTORY — DX: Unspecified fracture of shaft of humerus, unspecified arm, initial encounter for closed fracture: S42.309A

## 2020-02-04 HISTORY — DX: Unspecified osteoarthritis, unspecified site: M19.90

## 2020-02-04 HISTORY — PX: ORIF HUMERUS FRACTURE: SHX2126

## 2020-02-04 SURGERY — OPEN REDUCTION INTERNAL FIXATION (ORIF) HUMERAL SHAFT FRACTURE
Anesthesia: General | Site: Arm Upper | Laterality: Left

## 2020-02-04 MED ORDER — METHOCARBAMOL 1000 MG/10ML IJ SOLN
500.0000 mg | Freq: Four times a day (QID) | INTRAVENOUS | Status: DC | PRN
  Filled 2020-02-04: qty 5

## 2020-02-04 MED ORDER — METOCLOPRAMIDE HCL 5 MG/ML IJ SOLN: 5.0000 mg | Freq: Three times a day (TID) | INTRAMUSCULAR | Status: DC | PRN

## 2020-02-04 MED ORDER — PROPOFOL 10 MG/ML IV BOLUS
INTRAVENOUS | Status: DC | PRN
  Administered 2020-02-04: 150 mg via INTRAVENOUS

## 2020-02-04 MED ORDER — ACETAMINOPHEN 500 MG PO TABS: 1000.0000 mg | ORAL_TABLET | Freq: Four times a day (QID) | ORAL | Status: DC

## 2020-02-04 MED ORDER — PHENYLEPHRINE 40 MCG/ML (10ML) SYRINGE FOR IV PUSH (FOR BLOOD PRESSURE SUPPORT)
PREFILLED_SYRINGE | INTRAVENOUS | Status: DC | PRN
  Administered 2020-02-04 (×4): 80 ug via INTRAVENOUS
  Administered 2020-02-04: 40 ug via INTRAVENOUS
  Administered 2020-02-04 (×2): 80 ug via INTRAVENOUS

## 2020-02-04 MED ORDER — ONDANSETRON HCL 4 MG/2ML IJ SOLN
INTRAMUSCULAR | Status: DC | PRN
  Administered 2020-02-04: 4 mg via INTRAVENOUS

## 2020-02-04 MED ORDER — CEFAZOLIN SODIUM-DEXTROSE 2-4 GM/100ML-% IV SOLN: 2.0000 g | Freq: Four times a day (QID) | INTRAVENOUS | Status: DC

## 2020-02-04 MED ORDER — PROPOFOL 10 MG/ML IV BOLUS
INTRAVENOUS | Status: AC
  Filled 2020-02-04: qty 20

## 2020-02-04 MED ORDER — OXYCODONE HCL 5 MG PO TABS: 5.0000 mg | ORAL_TABLET | Freq: Once | ORAL | Status: DC | PRN

## 2020-02-04 MED ORDER — PHENYLEPHRINE 40 MCG/ML (10ML) SYRINGE FOR IV PUSH (FOR BLOOD PRESSURE SUPPORT)
PREFILLED_SYRINGE | INTRAVENOUS | Status: AC
  Filled 2020-02-04: qty 10

## 2020-02-04 MED ORDER — ONDANSETRON HCL 4 MG PO TABS: 4.0000 mg | ORAL_TABLET | Freq: Four times a day (QID) | ORAL | Status: DC | PRN

## 2020-02-04 MED ORDER — HYDROMORPHONE HCL 1 MG/ML IJ SOLN
0.5000 mg | INTRAMUSCULAR | Status: DC | PRN
Start: 2020-02-04 — End: 2020-02-04

## 2020-02-04 MED ORDER — LIDOCAINE 2% (20 MG/ML) 5 ML SYRINGE
INTRAMUSCULAR | Status: AC
  Filled 2020-02-04: qty 5

## 2020-02-04 MED ORDER — ACETAMINOPHEN 500 MG PO TABS
ORAL_TABLET | ORAL | Status: AC
  Filled 2020-02-04: qty 2

## 2020-02-04 MED ORDER — OXYCODONE HCL 5 MG PO TABS: 5.0000 mg | ORAL_TABLET | Freq: Three times a day (TID) | ORAL | 0 refills | Status: DC | PRN

## 2020-02-04 MED ORDER — ACETAMINOPHEN 500 MG PO TABS
1000.0000 mg | ORAL_TABLET | Freq: Once | ORAL | Status: AC
  Administered 2020-02-04: 1000 mg via ORAL

## 2020-02-04 MED ORDER — ROCURONIUM BROMIDE 10 MG/ML (PF) SYRINGE
PREFILLED_SYRINGE | INTRAVENOUS | Status: DC | PRN
  Administered 2020-02-04: 50 mg via INTRAVENOUS

## 2020-02-04 MED ORDER — ROCURONIUM BROMIDE 10 MG/ML (PF) SYRINGE
PREFILLED_SYRINGE | INTRAVENOUS | Status: AC
  Filled 2020-02-04: qty 10

## 2020-02-04 MED ORDER — CEFAZOLIN SODIUM-DEXTROSE 2-4 GM/100ML-% IV SOLN
INTRAVENOUS | Status: AC
  Filled 2020-02-04: qty 100

## 2020-02-04 MED ORDER — BUPIVACAINE-EPINEPHRINE (PF) 0.5% -1:200000 IJ SOLN
INTRAMUSCULAR | Status: DC | PRN
  Administered 2020-02-04: 15 mL via PERINEURAL

## 2020-02-04 MED ORDER — BISACODYL 10 MG RE SUPP: 10.0000 mg | Freq: Every day | RECTAL | Status: DC | PRN

## 2020-02-04 MED ORDER — TRANEXAMIC ACID-NACL 1000-0.7 MG/100ML-% IV SOLN
INTRAVENOUS | Status: DC | PRN
  Administered 2020-02-04: 1000 mg via INTRAVENOUS

## 2020-02-04 MED ORDER — LIDOCAINE 2% (20 MG/ML) 5 ML SYRINGE
INTRAMUSCULAR | Status: DC | PRN
  Administered 2020-02-04: 80 mg via INTRAVENOUS

## 2020-02-04 MED ORDER — OXYCODONE HCL 5 MG PO TABS
5.0000 mg | ORAL_TABLET | ORAL | Status: DC | PRN
Start: 2020-02-04 — End: 2020-02-04

## 2020-02-04 MED ORDER — ONDANSETRON HCL 4 MG/2ML IJ SOLN
INTRAMUSCULAR | Status: AC
  Filled 2020-02-04: qty 2

## 2020-02-04 MED ORDER — DEXAMETHASONE SODIUM PHOSPHATE 10 MG/ML IJ SOLN
INTRAMUSCULAR | Status: DC | PRN
  Administered 2020-02-04: 10 mg via INTRAVENOUS

## 2020-02-04 MED ORDER — SUGAMMADEX SODIUM 200 MG/2ML IV SOLN
INTRAVENOUS | Status: DC | PRN
  Administered 2020-02-04: 200 mg via INTRAVENOUS

## 2020-02-04 MED ORDER — FENTANYL CITRATE (PF) 100 MCG/2ML IJ SOLN
INTRAMUSCULAR | Status: AC
  Filled 2020-02-04: qty 2

## 2020-02-04 MED ORDER — FENTANYL CITRATE (PF) 100 MCG/2ML IJ SOLN
100.0000 ug | Freq: Once | INTRAMUSCULAR | Status: AC
Start: 2020-02-04 — End: 2020-02-04
  Administered 2020-02-04: 50 ug via INTRAVENOUS

## 2020-02-04 MED ORDER — ACETAMINOPHEN 500 MG PO TABS: 1000.0000 mg | ORAL_TABLET | Freq: Four times a day (QID) | ORAL | 2 refills | Status: AC | PRN

## 2020-02-04 MED ORDER — FENTANYL CITRATE (PF) 100 MCG/2ML IJ SOLN: 25.0000 ug | INTRAMUSCULAR | Status: DC | PRN

## 2020-02-04 MED ORDER — LACTATED RINGERS IV SOLN: INTRAVENOUS | Status: DC

## 2020-02-04 MED ORDER — EPHEDRINE 5 MG/ML INJ
INTRAVENOUS | Status: AC
  Filled 2020-02-04: qty 10

## 2020-02-04 MED ORDER — MIDAZOLAM HCL 2 MG/2ML IJ SOLN
INTRAMUSCULAR | Status: AC
  Filled 2020-02-04: qty 2

## 2020-02-04 MED ORDER — EPHEDRINE SULFATE-NACL 50-0.9 MG/10ML-% IV SOSY
PREFILLED_SYRINGE | INTRAVENOUS | Status: DC | PRN
  Administered 2020-02-04 (×2): 10 mg via INTRAVENOUS
  Administered 2020-02-04: 5 mg via INTRAVENOUS
  Administered 2020-02-04: 10 mg via INTRAVENOUS
  Administered 2020-02-04: 5 mg via INTRAVENOUS
  Administered 2020-02-04: 20 mg via INTRAVENOUS
  Administered 2020-02-04 (×2): 10 mg via INTRAVENOUS

## 2020-02-04 MED ORDER — METHOCARBAMOL 500 MG PO TABS: 500.0000 mg | ORAL_TABLET | Freq: Four times a day (QID) | ORAL | Status: DC | PRN

## 2020-02-04 MED ORDER — DEXAMETHASONE SODIUM PHOSPHATE 10 MG/ML IJ SOLN
INTRAMUSCULAR | Status: AC
  Filled 2020-02-04: qty 1

## 2020-02-04 MED ORDER — ONDANSETRON HCL 4 MG PO TABS: 4.0000 mg | ORAL_TABLET | Freq: Every day | ORAL | 1 refills | Status: DC | PRN

## 2020-02-04 MED ORDER — ONDANSETRON HCL 4 MG/2ML IJ SOLN: 4.0000 mg | Freq: Four times a day (QID) | INTRAMUSCULAR | Status: DC | PRN

## 2020-02-04 MED ORDER — PROMETHAZINE HCL 25 MG/ML IJ SOLN: 6.2500 mg | INTRAMUSCULAR | Status: DC | PRN

## 2020-02-04 MED ORDER — OXYCODONE HCL 5 MG/5ML PO SOLN: 5.0000 mg | Freq: Once | ORAL | Status: DC | PRN

## 2020-02-04 MED ORDER — MIDAZOLAM HCL 2 MG/2ML IJ SOLN
2.0000 mg | Freq: Once | INTRAMUSCULAR | Status: AC
  Administered 2020-02-04: 1 mg via INTRAVENOUS

## 2020-02-04 MED ORDER — TRANEXAMIC ACID-NACL 1000-0.7 MG/100ML-% IV SOLN
INTRAVENOUS | Status: AC
  Filled 2020-02-04: qty 100

## 2020-02-04 MED ORDER — OXYCODONE HCL 5 MG PO TABS: 10.0000 mg | ORAL_TABLET | ORAL | Status: DC | PRN

## 2020-02-04 MED ORDER — BUPIVACAINE LIPOSOME 1.3 % IJ SUSP
INTRAMUSCULAR | Status: DC | PRN
  Administered 2020-02-04: 10 mL via PERINEURAL

## 2020-02-04 MED ORDER — SENNOSIDES-DOCUSATE SODIUM 8.6-50 MG PO TABS: 1.0000 | ORAL_TABLET | Freq: Every evening | ORAL | Status: DC | PRN

## 2020-02-04 MED ORDER — METOCLOPRAMIDE HCL 5 MG PO TABS
5.0000 mg | ORAL_TABLET | Freq: Three times a day (TID) | ORAL | Status: DC | PRN
Start: 2020-02-04 — End: 2020-02-04

## 2020-02-04 MED ORDER — CEFAZOLIN SODIUM-DEXTROSE 2-4 GM/100ML-% IV SOLN
2.0000 g | INTRAVENOUS | Status: AC
  Administered 2020-02-04: 2 g via INTRAVENOUS

## 2020-02-04 SURGICAL SUPPLY — 82 items
BIT DRILL CALIBRTD AFFXS 3.3ST (DRILL) ×1 IMPLANT
BIT DRILL SURG TIB 3.3X152.5 (DRILL) ×1 IMPLANT
BLADE SURG 15 STRL LF DISP TIS (BLADE) ×1 IMPLANT
BLADE SURG 15 STRL SS (BLADE) ×2
BNDG ELASTIC 4X5.8 VLCR STR LF (GAUZE/BANDAGES/DRESSINGS) ×4 IMPLANT
BNDG ESMARK 4X9 LF (GAUZE/BANDAGES/DRESSINGS) IMPLANT
CANISTER SUCT 1200ML W/VALVE (MISCELLANEOUS) IMPLANT
CHLORAPREP W/TINT 26 (MISCELLANEOUS) ×2 IMPLANT
CLSR STERI-STRIP ANTIMIC 1/2X4 (GAUZE/BANDAGES/DRESSINGS) ×2 IMPLANT
COVER WAND RF STERILE (DRAPES) IMPLANT
CUFF TOURN SGL QUICK 18X3 (MISCELLANEOUS) IMPLANT
CUFF TOURN SGL QUICK 18X4 (TOURNIQUET CUFF) IMPLANT
CUFF TOURN SGL QUICK 24 (TOURNIQUET CUFF)
CUFF TRNQT CYL 24X4X16.5-23 (TOURNIQUET CUFF) IMPLANT
DECANTER SPIKE VIAL GLASS SM (MISCELLANEOUS) IMPLANT
DRAPE C-ARM 42X72 X-RAY (DRAPES) ×2 IMPLANT
DRAPE EXTREMITY T 121X128X90 (DISPOSABLE) ×2 IMPLANT
DRAPE IMP U-DRAPE 54X76 (DRAPES) ×2 IMPLANT
DRAPE INCISE IOBAN 66X45 STRL (DRAPES) ×2 IMPLANT
DRAPE OEC MINIVIEW 54X84 (DRAPES) ×2 IMPLANT
DRAPE U-SHAPE 47X51 STRL (DRAPES) ×2 IMPLANT
DRAPE U-SHAPE 76X120 STRL (DRAPES) ×4 IMPLANT
DRILL CALIBRATED AFFIXUS 3.3ST (DRILL) ×2
DRILL SURG TIB 3.3X152.5 (DRILL) ×2
DRSG PAD ABDOMINAL 8X10 ST (GAUZE/BANDAGES/DRESSINGS) ×2 IMPLANT
ELECT REM PT RETURN 9FT ADLT (ELECTROSURGICAL) ×2
ELECTRODE REM PT RTRN 9FT ADLT (ELECTROSURGICAL) ×1 IMPLANT
EXT HOSE W/PLC CONNECTION (MISCELLANEOUS)
EXTENSION HOSE W/PLC CONNECTON (MISCELLANEOUS) IMPLANT
GAUZE SPONGE 4X4 12PLY STRL (GAUZE/BANDAGES/DRESSINGS) ×2 IMPLANT
GAUZE XEROFORM 1X8 LF (GAUZE/BANDAGES/DRESSINGS) IMPLANT
GLOVE BIO SURGEON STRL SZ7.5 (GLOVE) ×4 IMPLANT
GLOVE ECLIPSE 6.5 STRL STRAW (GLOVE) ×2 IMPLANT
GLOVE ECLIPSE 8.0 STRL XLNG CF (GLOVE) ×2 IMPLANT
GLOVE SRG 8 PF TXTR STRL LF DI (GLOVE) ×2 IMPLANT
GLOVE SURG UNDER POLY LF SZ6.5 (GLOVE) ×2 IMPLANT
GLOVE SURG UNDER POLY LF SZ7 (GLOVE) ×2 IMPLANT
GLOVE SURG UNDER POLY LF SZ7.5 (GLOVE) ×2 IMPLANT
GLOVE SURG UNDER POLY LF SZ8 (GLOVE) ×4
GOWN STRL REUS W/ TWL LRG LVL3 (GOWN DISPOSABLE) ×2 IMPLANT
GOWN STRL REUS W/ TWL XL LVL3 (GOWN DISPOSABLE) ×2 IMPLANT
GOWN STRL REUS W/TWL LRG LVL3 (GOWN DISPOSABLE) ×4
GOWN STRL REUS W/TWL XL LVL3 (GOWN DISPOSABLE) ×4
GUIDEWIRE BALL NOSE AFFIXUS (WIRE) ×2 IMPLANT
K-WIRE W/TROCAR TIP 2.5 (WIRE) ×4
KIT LEG STABILIZATION (KITS) ×2 IMPLANT
KWIRE W/TROCAR TIP 2.5 (WIRE) ×2 IMPLANT
MANIFOLD NEPTUNE II (INSTRUMENTS) ×2 IMPLANT
NAIL AFFIXUS HUMERAL L.LONG 7X (Miscellaneous) ×2 IMPLANT
NEEDLE HYPO 25X1 1.5 SAFETY (NEEDLE) IMPLANT
NS IRRIG 1000ML POUR BTL (IV SOLUTION) ×2 IMPLANT
PACK ARTHROSCOPY DSU (CUSTOM PROCEDURE TRAY) ×2 IMPLANT
PACK BASIN DAY SURGERY FS (CUSTOM PROCEDURE TRAY) ×2 IMPLANT
PADDING CAST ABS 4INX4YD NS (CAST SUPPLIES) ×1
PADDING CAST ABS COTTON 4X4 ST (CAST SUPPLIES) ×1 IMPLANT
PENCIL SMOKE EVACUATOR (MISCELLANEOUS) ×2 IMPLANT
SCREW ANN CORT BONE 4X28 (Screw) ×2 IMPLANT
SCREW BLUNT TIP AFFIXUS 4X40 (Screw) ×4 IMPLANT
SCREW BLUNT TIP AFFIXUS 4X48 (Screw) ×2 IMPLANT
SCREW CORT AFFIXUS 4X26 (Screw) ×2 IMPLANT
SLEEVE SCD COMPRESS KNEE MED (MISCELLANEOUS) ×2 IMPLANT
SLING ARM FOAM STRAP LRG (SOFTGOODS) ×2 IMPLANT
SPLINT FAST PLASTER 5X30 (CAST SUPPLIES)
SPLINT PLASTER CAST FAST 5X30 (CAST SUPPLIES) IMPLANT
SPONGE LAP 18X18 RF (DISPOSABLE) ×2 IMPLANT
SUCTION FRAZIER HANDLE 10FR (MISCELLANEOUS) ×2
SUCTION TUBE FRAZIER 10FR DISP (MISCELLANEOUS) ×1 IMPLANT
SUT FIBERWIRE #2 38 T-5 BLUE (SUTURE)
SUT MNCRL AB 3-0 PS2 18 (SUTURE) ×2 IMPLANT
SUT MNCRL AB 4-0 PS2 18 (SUTURE) IMPLANT
SUT MON AB 2-0 CT1 36 (SUTURE) IMPLANT
SUT VIC AB 0 CT1 27 (SUTURE) ×4
SUT VIC AB 0 CT1 27XBRD ANBCTR (SUTURE) ×2 IMPLANT
SUT VIC AB 2-0 CT1 27 (SUTURE) ×2
SUT VIC AB 2-0 CT1 TAPERPNT 27 (SUTURE) ×1 IMPLANT
SUT VIC AB 2-0 SH 27 (SUTURE) ×4
SUT VIC AB 2-0 SH 27XBRD (SUTURE) ×2 IMPLANT
SUTURE FIBERWR #2 38 T-5 BLUE (SUTURE) IMPLANT
SYR BULB EAR ULCER 3OZ GRN STR (SYRINGE) ×2 IMPLANT
TOWEL GREEN STERILE FF (TOWEL DISPOSABLE) ×2 IMPLANT
UNDERPAD 30X36 HEAVY ABSORB (UNDERPADS AND DIAPERS) IMPLANT
YANKAUER SUCT BULB TIP NO VENT (SUCTIONS) ×2 IMPLANT

## 2020-02-04 NOTE — Anesthesia Procedure Notes (Signed)
Anesthesia Regional Block: Interscalene brachial plexus block   Pre-Anesthetic Checklist: ,, timeout performed, Correct Patient, Correct Site, Correct Laterality, Correct Procedure, Correct Position, site marked, Risks and benefits discussed, pre-op evaluation,  At surgeon's request and post-op pain management  Laterality: Left  Prep: Maximum Sterile Barrier Precautions used, chloraprep       Needles:  Injection technique: Single-shot  Needle Type: Echogenic Stimulator Needle     Needle Length: 4cm  Needle Gauge: 22     Additional Needles:   Procedures:,,,, ultrasound used (permanent image in chart),,,,  Narrative:  Start time: 02/04/2020 11:40 AM End time: 02/04/2020 11:43 AM Injection made incrementally with aspirations every 5 mL.  Performed by: Personally  Anesthesiologist: Kaylyn Layer, MD  Additional Notes: Risks, benefits, and alternative discussed. Patient gave consent for procedure. Patient prepped and draped in sterile fashion. Sedation administered, patient remains easily responsive to voice. Relevant anatomy identified with ultrasound guidance. Local anesthetic given in 5cc increments with no signs or symptoms of intravascular injection. No pain or paraesthesias with injection. Patient monitored throughout procedure with signs of LAST or immediate complications. Tolerated well. Ultrasound image placed in chart.  Amalia Greenhouse, MD

## 2020-02-04 NOTE — Discharge Instructions (Signed)
Keep wrist elevated with ice as much as possible to reduce pain and swelling. If needed, you may increase pain medication for the first few days post op to 2 tablets every 4 hours.  Stop as needed pain medication as soon as you are able.  Diet: As you were doing prior to hospitalization   Shower:  You have a splint on, leave the splint in place and keep the splint dry with a plastic bag.  Dressing:  You have a splint - leave the splint in place and we will change your bandages during your first follow-up appointment.    Activity:  Increase activity slowly as tolerated, but follow the weight bearing instructions below.  The rules on driving is that you can not be taking narcotics while you drive, and you must feel in control of the vehicle.    Weight Bearing:  No more then 5 lbs at the elbow. No weight at the shoulder. Sling for comfort.   To prevent constipation: you may use a stool softener such as -  Colace (over the counter) 100 mg by mouth twice a day  Drink plenty of fluids (prune juice may be helpful) and high fiber foods Miralax (over the counter) for constipation as needed.    Itching:  If you experience itching with your medications, try taking only a single pain pill, or even half a pain pill at a time.  You can also use benadryl over the counter for itching or also to help with sleep.   Precautions:  If you experience chest pain or shortness of breath - call 911 immediately for transfer to the hospital emergency department!!  If you develop a fever greater that 101 F, purulent drainage from wound, increased redness or drainage from wound, or calf pain -- Call the office at [phone]                                         Follow- Up Appointment:  Please call for an appointment to be seen in 2 weeks [place] - [phone]  No Tylenol until after 5:30pm today if needed  Post Anesthesia Home Care Instructions  Activity: Get plenty of rest for the remainder of the day.  A responsible individual must stay with you for 24 hours following the procedure.  For the next 24 hours, DO NOT: -Drive a car -Advertising copywriter -Drink alcoholic beverages -Take any medication unless instructed by your physician -Make any legal decisions or sign important papers.  Meals: Start with liquid foods such as gelatin or soup. Progress to regular foods as tolerated. Avoid greasy, spicy, heavy foods. If nausea and/or vomiting occur, drink only clear liquids until the nausea and/or vomiting subsides. Call your physician if vomiting continues.  Special Instructions/Symptoms: Your throat may feel dry or sore from the anesthesia or the breathing tube placed in your throat during surgery. If this causes discomfort, gargle with warm salt water. The discomfort should disappear within 24 hours.  If you had a scopolamine patch placed behind your ear for the management of post- operative nausea and/or vomiting:  1. The medication in the patch is effective for 72 hours, after which it should be removed.  Wrap patch in a tissue and discard in the trash. Wash hands thoroughly with soap and water. 2. You may remove the patch earlier than 72 hours if you experience unpleasant side effects which may  include dry mouth, dizziness or visual disturbances. 3. Avoid touching the patch. Wash your hands with soap and water after contact with the patch.    Regional Anesthesia Blocks  1. Numbness or the inability to move the "blocked" extremity may last from 3-48 hours after placement. The length of time depends on the medication injected and your individual response to the medication. If the numbness is not going away after 48 hours, call your surgeon.  2. The extremity that is blocked will need to be protected until the numbness is gone and the  Strength has returned. Because you cannot feel it, you will need to take extra care to avoid injury. Because it may be weak, you may have difficulty moving it or  using it. You may not know what position it is in without looking at it while the block is in effect.  3. For blocks in the legs and feet, returning to weight bearing and walking needs to be done carefully. You will need to wait until the numbness is entirely gone and the strength has returned. You should be able to move your leg and foot normally before you try and bear weight or walk. You will need someone to be with you when you first try to ensure you do not fall and possibly risk injury.  4. Bruising and tenderness at the needle site are common side effects and will resolve in a few days.  5. Persistent numbness or new problems with movement should be communicated to the surgeon or the [facility] [phone] [facility] [phone]).  Information for Discharge Teaching: EXPAREL (bupivacaine liposome injectable suspension)   Your surgeon or anesthesiologist gave you EXPAREL(bupivacaine) to help control your pain after surgery.   EXPAREL is a local anesthetic that provides pain relief by numbing the tissue around the surgical site.  EXPAREL is designed to release pain medication over time and can control pain for up to 72 hours.  Depending on how you respond to EXPAREL, you may require less pain medication during your recovery.  Possible side effects:  Temporary loss of sensation or ability to move in the area where bupivacaine was injected.  Nausea, vomiting, constipation  Rarely, numbness and tingling in your mouth or lips, lightheadedness, or anxiety may occur.  Call your doctor right away if you think you may be experiencing any of these sensations, or if you have other questions regarding possible side effects.  Follow all other discharge instructions given to you by your surgeon or nurse. Eat a healthy diet and drink plenty of water or other fluids.  If you return to the hospital for any reason within 96 hours following the administration of  EXPAREL, it is important for health care providers to know that you have received this anesthetic. A teal colored band has been placed on your arm with the date, time and amount of EXPAREL you have received in order to alert and inform your health care providers. Please leave this armband in place for the full 96 hours following administration, and then you may remove the band.

## 2020-02-04 NOTE — Progress Notes (Signed)
Assisted Dr. Howze with left, ultrasound guided, supraclavicular block. Side rails up, monitors on throughout procedure. See vital signs in flow sheet. Tolerated Procedure well. 

## 2020-02-04 NOTE — Anesthesia Procedure Notes (Signed)
Procedure Name: Intubation Date/Time: 02/04/2020 12:25 PM Performed by: Pearson Grippe, CRNA Pre-anesthesia Checklist: Patient identified, Emergency Drugs available, Suction available and Patient being monitored Patient Re-evaluated:Patient Re-evaluated prior to induction Oxygen Delivery Method: Circle system utilized Preoxygenation: Pre-oxygenation with 100% oxygen Induction Type: IV induction Ventilation: Mask ventilation without difficulty and Oral airway inserted - appropriate to patient size Laryngoscope Size: Hyacinth Meeker and 2 Grade View: Grade I Tube type: Oral Tube size: 7.5 mm Number of attempts: 1 Airway Equipment and Method: Stylet and Oral airway Placement Confirmation: ETT inserted through vocal cords under direct vision,  positive ETCO2 and breath sounds checked- equal and bilateral Secured at: 22 cm Tube secured with: Tape Dental Injury: Teeth and Oropharynx as per pre-operative assessment

## 2020-02-04 NOTE — Interval H&P Note (Signed)
I participated in the care of this patient and agree with the above history, physical and evaluation. I performed a review of the history and a physical exam as detailed   Kenston Longton Daniel Jerlene Rockers MD  

## 2020-02-04 NOTE — Op Note (Signed)
02/04/2020  12:18 PM  PATIENT:  Troy Chapman    PRE-OPERATIVE DIAGNOSIS:  HUMERAL SHAFT FRACTURE LEFT  POST-OPERATIVE DIAGNOSIS:  Same  PROCEDURE:  OPEN REDUCTION INTERNAL FIXATION (ORIF) HUMERAL SHAFT FRACTURE  SURGEON:  Sheral Apley, MD  ASSISTANT: Daun Peacock, PA-C, he was present and scrubbed throughout the case, critical for completion in a timely fashion, and for retraction, instrumentation, and closure.   ANESTHESIA:   gen  PREOPERATIVE INDICATIONS:  Troy Chapman is a  65 y.o. male with a diagnosis of HUMERAL SHAFT FRACTURE LEFT who failed conservative measures and elected for surgical management.    The risks benefits and alternatives were discussed with the patient preoperatively including but not limited to the risks of infection, bleeding, nerve injury, cardiopulmonary complications, the need for revision surgery, among others, and the patient was willing to proceed.  OPERATIVE IMPLANTS: biomet hum nail  OPERATIVE FINDINGS: unstable fractures  BLOOD LOSS: min  COMPLICATIONS: none  TOURNIQUET TIME: none  OPERATIVE PROCEDURE:  Patient was identified in the preoperative holding area and site was marked by me He was transported to the operating theater and placed on the table in supine position taking care to pad all bony prominences. After a preincinduction time out anesthesia was induced. The left upper extremity was prepped and draped in normal sterile fashion and a pre-incision timeout was performed. He received ancef for preoperative antibiotics.   He was placed in beachchair again padding all bone prominences.   I made an oblique incision of the anterior corner of the acromion and dissected down to his deltoid muscle I split this in line with the fibers.  Next identified his rotator cuff incised this longitudinally as well to gain access to his proximal humerus he had previously inserted FiberWire stitches on that were loose here I removed  these.   Inserted a guidepin for the humeral nail  I took multiple x-rays of the shoulder and was happy with the alignment of this pin placement I then used the entry reamer to open the starting spot.  Next I reduced the proximal humerus fracture and passed the ball-tipped guidewire across this.   Reduce the humeral shaft fracture and proximal passed a ball-tipped guidewire across this as well I seated this into the distal humerus and took multiple x-rays of the humerus 4 views confirming appropriate placement and alignment.   Measured the appropriate nail I sequentially reamed and inserted this nail into the humerus to stabilize and fix the proximal humerus fracture  This nail also stabilize and fix the humeral shaft fracture I used the outrigger to place 3 proximal interlock screws and then used a perfect circles technique to place 3 distal interlock screws.  Next I repaired the rotator cuff.  After thorough irrigation of the shoulder.  I then closed his incisions in layers sterile dressings were applied he was placed in a sling and taken the PACU in stable condition  POST OPERATIVE PLAN: DVT prophylaxis will consist of early mobilization sling full-time

## 2020-02-04 NOTE — Transfer of Care (Signed)
Immediate Anesthesia Transfer of Care Note  Patient: Argyle Gustafson Roseman  Procedure(s) Performed: OPEN REDUCTION INTERNAL FIXATION (ORIF) HUMERAL SHAFT FRACTURE (Left Arm Upper)  Patient Location: PACU  Anesthesia Type:GA combined with regional for post-op pain  Level of Consciousness: awake, alert  and oriented  Airway & Oxygen Therapy: Patient Spontanous Breathing and Patient connected to face mask oxygen  Post-op Assessment: Report given to RN and Post -op Vital signs reviewed and stable  Post vital signs: Reviewed and stable  Last Vitals:  Vitals Value Taken Time  BP 117/74 02/04/20 1431  Temp    Pulse 80 02/04/20 1434  Resp 15 02/04/20 1434  SpO2 99 % 02/04/20 1434  Vitals shown include unvalidated device data.  Last Pain:  Vitals:   02/04/20 1123  TempSrc: Oral  PainSc: 8       Patients Stated Pain Goal: 6 (02/04/20 1123)  Complications: No complications documented.

## 2020-02-07 ENCOUNTER — Encounter (HOSPITAL_BASED_OUTPATIENT_CLINIC_OR_DEPARTMENT_OTHER): Payer: Self-pay | Admitting: Orthopedic Surgery

## 2020-02-10 NOTE — Anesthesia Postprocedure Evaluation (Signed)
Anesthesia Post Note  Patient: Troy Chapman  Procedure(s) Performed: OPEN REDUCTION INTERNAL FIXATION (ORIF) HUMERAL SHAFT FRACTURE (Left Arm Upper)     Patient location during evaluation: PACU Anesthesia Type: General Level of consciousness: awake and alert and oriented Pain management: pain level controlled Vital Signs Assessment: post-procedure vital signs reviewed and stable Respiratory status: spontaneous breathing, nonlabored ventilation and respiratory function stable Cardiovascular status: blood pressure returned to baseline Postop Assessment: no apparent nausea or vomiting Anesthetic complications: no   No complications documented.  Last Vitals:  Vitals:   02/04/20 1445 02/04/20 1506  BP: 116/77 111/73  Pulse: 81 77  Resp: 19 18  Temp:  36.4 C  SpO2: 94% 95%    Last Pain:  Vitals:   02/04/20 1506  TempSrc:   PainSc: 0-No pain                 Kaylyn Layer

## 2020-04-03 ENCOUNTER — Other Ambulatory Visit: Payer: Self-pay | Admitting: Medical

## 2020-04-07 ENCOUNTER — Other Ambulatory Visit: Payer: Self-pay | Admitting: Medical

## 2020-07-14 ENCOUNTER — Other Ambulatory Visit: Payer: Self-pay | Admitting: Medical

## 2020-07-14 NOTE — Telephone Encounter (Signed)
Is this okay to refill? 

## 2020-09-11 ENCOUNTER — Other Ambulatory Visit: Payer: Self-pay | Admitting: Medical

## 2020-09-11 NOTE — Telephone Encounter (Signed)
Send refill ,get in for yearly physical or med check if no insurance

## 2020-09-11 NOTE — Telephone Encounter (Signed)
Is this okay to refill? 

## 2020-09-12 NOTE — Telephone Encounter (Signed)
Tried to call pt to tell him to schedule a visit but vm is not set up

## 2020-11-06 ENCOUNTER — Ambulatory Visit (INDEPENDENT_AMBULATORY_CARE_PROVIDER_SITE_OTHER): Payer: Self-pay | Admitting: Medical

## 2020-11-06 ENCOUNTER — Other Ambulatory Visit: Payer: Self-pay

## 2020-11-06 ENCOUNTER — Encounter: Payer: Self-pay | Admitting: Medical

## 2020-11-06 VITALS — BP 110/70 | HR 64 | Temp 97.3°F | Wt 165.4 lb

## 2020-11-06 DIAGNOSIS — R634 Abnormal weight loss: Secondary | ICD-10-CM | POA: Insufficient documentation

## 2020-11-06 DIAGNOSIS — R197 Diarrhea, unspecified: Secondary | ICD-10-CM

## 2020-11-06 DIAGNOSIS — F17200 Nicotine dependence, unspecified, uncomplicated: Secondary | ICD-10-CM

## 2020-11-06 DIAGNOSIS — R918 Other nonspecific abnormal finding of lung field: Secondary | ICD-10-CM

## 2020-11-06 DIAGNOSIS — R1013 Epigastric pain: Secondary | ICD-10-CM

## 2020-11-06 DIAGNOSIS — Z79899 Other long term (current) drug therapy: Secondary | ICD-10-CM

## 2020-11-06 HISTORY — DX: Abnormal weight loss: R63.4

## 2020-11-06 HISTORY — DX: Other nonspecific abnormal finding of lung field: R91.8

## 2020-11-06 HISTORY — DX: Diarrhea, unspecified: R19.7

## 2020-11-06 HISTORY — DX: Other long term (current) drug therapy: Z79.899

## 2020-11-06 HISTORY — DX: Epigastric pain: R10.13

## 2020-11-06 MED ORDER — ONDANSETRON 4 MG PO TBDP: 4.0000 mg | ORAL_TABLET | Freq: Three times a day (TID) | ORAL | 0 refills | Status: DC | PRN

## 2020-11-06 NOTE — Progress Notes (Signed)
Subjective:  Troy Chapman is a 65 y.o. male who presents for Chief Complaint  Patient presents with   Abdominal Pain    Abdominal pain x 2 weeks right in the middle of stomach. Diarrhea some. Today he feels good     Here notes 2 week hx/o pains in abdomen and diarrhea.   Some days better, some days worse.  Has some nausea, but no vomiting.  For past 2 weeks has had 5-8 loose stools daily.  No blood.   Brown to yellow stools.  No recent travel.  No recent questionable water sources.   Has new puppy x 2 months, but dog hasn't been sick and has its shots.  Belly hurts right in the middle of abdomen.   Appetite is down.    No recent antibiotic use.  No recent questionable food exposure.  No sick contacts.  No recent hospital visitation or nursing home visitation.  No fever.  No urinary issues.  Using some pepto bismol and tums.  No other aggravating or relieving factors.    No other c/o.  The following portions of the patient's history were reviewed and updated as appropriate: allergies, current medications, past family history, past medical history, past social history, past surgical history and problem list.  ROS Otherwise as in subjective above  Objective: BP 110/70   Pulse 64   Temp (!) 97.3 F (36.3 C)   Wt 165 lb 6.4 oz (75 kg)   BMI 25.91 kg/m   Wt Readings from Last 3 Encounters:  11/06/20 165 lb 6.4 oz (75 kg)  02/04/20 177 lb 4 oz (80.4 kg)  09/08/19 176 lb (79.8 kg)    General appearance: alert, no distress, well developed, well nourished Sclera normal appearing, no jaundice Neck: supple, no lymphadenopathy, no thyromegaly, no masses Heart: RRR, normal S1, S2, no murmurs Lungs: decreased lung sounds throughout, no wheezes, rhonchi, or rales Abdomen: +increased bs, soft, mild epigastric and central abdomen tendnerss, otherwise non tender, non distended, no masses, no hepatomegaly, no splenomegaly Pulses: 2+ radial pulses, 2+ pedal pulses, normal cap refill Ext: no  edema    Assessment: Encounter Diagnoses  Name Primary?   Diarrhea, unspecified type Yes   Epigastric pain    High risk medication use    Weight loss    Tobacco use disorder    Abnormal lung field      Plan: We discussed his symptoms.  Differential could be infection, ulcer, pancreatitis, or other.  He takes NSAIDs daily and drinks almost a bottle of alcohol daily.  He has lost weight in recent months but he was not sure if this was intentional or not.  He has not been actively trying to lose weight necessarily  Advised if blood in stool or fever to call back right away.  Consider COPD diagnosis and possible treatment.  He is not planning on quitting smoking.  We discussed the following recommendations which were also printed for patient  Patient Instructions  Your symptoms and exam this could suggest several possible causes  1 possibility is infection.  I cannot rule out tumor without scanning  1 suspicion is ulcer since you are on anti-inflammatories and drink alcohol regularly.  An ulcer could cause pain, possible loose stool and weight loss  I do recommend short-term that you cut back on alcohol and meloxicam and see if the symptoms resolved.  Begin Zofran to help with nausea.  You can take this every 4-6 hours as needed  Drink plenty of  clear fluids such as water, ginger ale, G2 Gatorade, soup broth for example.  You can continue Pepto-Bismol as needed  If you have no fever and if you do not have blood in the stool, you can use Imodium over-the-counter twice daily for the next few days.  If you develop fever or blood in the stool or, then let me know right away and hold off on any Imodium.  Your lung exam is abnormal.  I suspect COPD.  I did put in an order for chest x-ray.  You can go for updated chest x-ray at your convenience as a screening and to further evaluate your decreased lung sounds  If you have trouble breathing it may be worth trying an inhaler such as  a daily preventative inhaler.   Consider going to [facility] for your chest xray.   Their hours are 8am - 4:30 pm Monday - Friday.  Take your insurance card with you.  [facility] [phone]  [address], [address] [place], [place] [address]  [address] [place], [place] [address]   Troy Chapman was seen today for abdominal pain.  Diagnoses and all orders for this visit:  Diarrhea, unspecified type -     Comprehensive metabolic panel -     CBC with Differential/Platelet -     Lipase -     GI Profile, Stool, PCR; Future  Epigastric pain -     Comprehensive metabolic panel -     CBC with Differential/Platelet -     Lipase -     GI Profile, Stool, PCR; Future  High risk medication use  Weight loss -     Comprehensive metabolic panel -     CBC with Differential/Platelet -     Lipase -     GI Profile, Stool, PCR; Future  Tobacco use disorder -     DG Chest 2 View; Future  Abnormal lung field -     DG Chest 2 View; Future  Other orders -     ondansetron (ZOFRAN ODT) 4 MG disintegrating tablet; Take 1 tablet (4 mg total) by mouth every 8 (eight) hours as needed for nausea or vomiting.   Follow up: pending labs

## 2020-11-06 NOTE — Patient Instructions (Addendum)
Your symptoms and exam this could suggest several possible causes  1 possibility is infection.  I cannot rule out tumor without scanning  1 suspicion is ulcer since you are on anti-inflammatories and drink alcohol regularly.  An ulcer could cause pain, possible loose stool and weight loss  I do recommend short-term that you cut back on alcohol and meloxicam and see if the symptoms resolved.  Begin Zofran to help with nausea.  You can take this every 4-6 hours as needed  Drink plenty of clear fluids such as water, ginger ale, G2 Gatorade, soup broth for example.  You can continue Pepto-Bismol as needed  If you have no fever and if you do not have blood in the stool, you can use Imodium over-the-counter twice daily for the next few days.  If you develop fever or blood in the stool or, then let me know right away and hold off on any Imodium.  Your lung exam is abnormal.  I suspect COPD.  I did put in an order for chest x-ray.  You can go for updated chest x-ray at your convenience as a screening and to further evaluate your decreased lung sounds  If you have trouble breathing it may be worth trying an inhaler such as a daily preventative inhaler.   Consider going to [facility] for your chest xray.   Their hours are 8am - 4:30 pm Monday - Friday.  Take your insurance card with you.  [facility] [phone]  [address], [address] [place], [place] [address]  [address] [place], [place] [address]

## 2020-11-07 ENCOUNTER — Other Ambulatory Visit: Payer: Self-pay | Admitting: Medical

## 2020-11-07 LAB — CBC WITH DIFFERENTIAL/PLATELET
Basophils Absolute: 0.1 10*3/uL (ref 0.0–0.2)
Basos: 1 %
EOS (ABSOLUTE): 0.4 10*3/uL (ref 0.0–0.4)
Eos: 6 %
Hematocrit: 52.1 % — ABNORMAL HIGH (ref 37.5–51.0)
Hemoglobin: 16.8 g/dL (ref 13.0–17.7)
Immature Grans (Abs): 0 10*3/uL (ref 0.0–0.1)
Immature Granulocytes: 0 %
Lymphocytes Absolute: 1.1 10*3/uL (ref 0.7–3.1)
Lymphs: 18 %
MCH: 27.3 pg (ref 26.6–33.0)
MCHC: 32.2 g/dL (ref 31.5–35.7)
MCV: 85 fL (ref 79–97)
Monocytes Absolute: 0.7 10*3/uL (ref 0.1–0.9)
Monocytes: 11 %
Neutrophils Absolute: 4 10*3/uL (ref 1.4–7.0)
Neutrophils: 64 %
Platelets: 132 10*3/uL — ABNORMAL LOW (ref 150–450)
RBC: 6.16 x10E6/uL — ABNORMAL HIGH (ref 4.14–5.80)
RDW: 14.2 % (ref 11.6–15.4)
WBC: 6.2 10*3/uL (ref 3.4–10.8)

## 2020-11-07 LAB — COMPREHENSIVE METABOLIC PANEL
ALT: 14 IU/L (ref 0–44)
AST: 17 IU/L (ref 0–40)
Albumin/Globulin Ratio: 1.5 (ref 1.2–2.2)
Albumin: 4.2 g/dL (ref 3.8–4.8)
Alkaline Phosphatase: 113 IU/L (ref 44–121)
BUN/Creatinine Ratio: 10 (ref 10–24)
BUN: 9 mg/dL (ref 8–27)
Bilirubin Total: 0.5 mg/dL (ref 0.0–1.2)
CO2: 24 mmol/L (ref 20–29)
Calcium: 8.9 mg/dL (ref 8.6–10.2)
Chloride: 103 mmol/L (ref 96–106)
Creatinine, Ser: 0.91 mg/dL (ref 0.76–1.27)
Globulin, Total: 2.8 g/dL (ref 1.5–4.5)
Glucose: 78 mg/dL (ref 70–99)
Potassium: 4.8 mmol/L (ref 3.5–5.2)
Sodium: 141 mmol/L (ref 134–144)
Total Protein: 7 g/dL (ref 6.0–8.5)
eGFR: 95 mL/min/{1.73_m2} (ref >59)

## 2020-11-07 LAB — LIPASE: Lipase: 32 U/L (ref 13–78)

## 2020-11-07 MED ORDER — OMEPRAZOLE 40 MG PO CPDR: 40.0000 mg | DELAYED_RELEASE_CAPSULE | Freq: Every day | ORAL | 2 refills | Status: DC

## 2020-11-07 MED ORDER — CIPROFLOXACIN HCL 500 MG PO TABS: 500.0000 mg | ORAL_TABLET | Freq: Two times a day (BID) | ORAL | 0 refills | Status: AC

## 2020-11-07 MED ORDER — AMLODIPINE BESYLATE 5 MG PO TABS: 5.0000 mg | ORAL_TABLET | Freq: Every day | ORAL | 1 refills | Status: DC

## 2020-11-08 NOTE — Addendum Note (Signed)
Addended by: Sheliah Plane on: 11/08/2020 08:14 AM   Modules accepted: Orders

## 2020-11-12 LAB — GI PROFILE, STOOL, PCR

## 2020-11-28 ENCOUNTER — Encounter: Payer: Self-pay | Admitting: Medical

## 2020-11-28 ENCOUNTER — Ambulatory Visit: Payer: Self-pay | Admitting: Medical

## 2020-12-21 ENCOUNTER — Telehealth: Payer: Self-pay | Admitting: Internal Medicine

## 2020-12-21 NOTE — Telephone Encounter (Signed)
-----   Message from Galvin Proffer sent at 12/20/2020  3:14 PM EST ----- Called pt and he did not answer and does not have voice mail.  Georgiann Hahn  ----- Message ----- From: Britt Boozer, CMA Sent: 12/20/2020   3:09 PM EST To: Margretta Ditty, Galvin Proffer, #  Patient no showed for last appt. Please call to schedule this.    Martie Lee

## 2020-12-26 ENCOUNTER — Ambulatory Visit (INDEPENDENT_AMBULATORY_CARE_PROVIDER_SITE_OTHER): Payer: Self-pay | Admitting: Medical

## 2020-12-26 ENCOUNTER — Other Ambulatory Visit: Payer: Self-pay

## 2020-12-26 ENCOUNTER — Encounter: Payer: Self-pay | Admitting: Medical

## 2020-12-26 VITALS — BP 182/108 | HR 72 | Ht 67.0 in | Wt 166.0 lb

## 2020-12-26 DIAGNOSIS — R768 Other specified abnormal immunological findings in serum: Secondary | ICD-10-CM

## 2020-12-26 DIAGNOSIS — R918 Other nonspecific abnormal finding of lung field: Secondary | ICD-10-CM

## 2020-12-26 DIAGNOSIS — R109 Unspecified abdominal pain: Secondary | ICD-10-CM

## 2020-12-26 DIAGNOSIS — F17200 Nicotine dependence, unspecified, uncomplicated: Secondary | ICD-10-CM

## 2020-12-26 DIAGNOSIS — I10 Essential (primary) hypertension: Secondary | ICD-10-CM

## 2020-12-26 DIAGNOSIS — R202 Paresthesia of skin: Secondary | ICD-10-CM

## 2020-12-26 DIAGNOSIS — Z79899 Other long term (current) drug therapy: Secondary | ICD-10-CM

## 2020-12-26 DIAGNOSIS — R197 Diarrhea, unspecified: Secondary | ICD-10-CM

## 2020-12-26 DIAGNOSIS — M069 Rheumatoid arthritis, unspecified: Secondary | ICD-10-CM

## 2020-12-26 DIAGNOSIS — R634 Abnormal weight loss: Secondary | ICD-10-CM

## 2020-12-26 HISTORY — DX: Other specified abnormal immunological findings in serum: R76.8

## 2020-12-26 MED ORDER — ONDANSETRON 4 MG PO TBDP
4.0000 mg | ORAL_TABLET | Freq: Three times a day (TID) | ORAL | 2 refills | Status: DC | PRN
Start: 2020-12-26 — End: 2021-08-15

## 2020-12-26 MED ORDER — HYDROCODONE-ACETAMINOPHEN 5-325 MG PO TABS: 1.0000 | ORAL_TABLET | Freq: Two times a day (BID) | ORAL | 0 refills | Status: DC | PRN

## 2020-12-26 MED ORDER — CIPROFLOXACIN HCL 500 MG PO TABS: 500.0000 mg | ORAL_TABLET | Freq: Two times a day (BID) | ORAL | 0 refills | Status: AC

## 2020-12-26 MED ORDER — OMEPRAZOLE 40 MG PO CPDR: 40.0000 mg | DELAYED_RELEASE_CAPSULE | Freq: Every day | ORAL | 3 refills | Status: DC

## 2020-12-26 NOTE — Progress Notes (Signed)
Subjective:  Troy Chapman is a 65 y.o. male who presents for Chief Complaint  Patient presents with   Diarrhea    Follow up- has continued has had 5 watery episodes this morning so far   Arthritis    Taking 9-12 tablets of Tylenol 500 mg a day ( takes 3 at a time)     Here for follow-up.  I saw him several weeks ago for diarrhea.  Despite labs being relatively normal he still has the same symptoms.  He has up to 5 watery loose stools every day.  He feels somewhat fatigued.  No appetite.  No blood in the stool.  No fever.  Still has some nausea.  Has run out of Zofran.  No recent sick contacts.  No C. difficile contacts.  No recent travel internationally.  Last colonoscopy probably a decade or more ago.  He does continue to smoke  He has seen rheumatology but feels like he cannot seem to getting quick enough for follow-up.  The soonest appointment they can give him was January.  He is in a lot of pain with his arthritis.  He has no prescription active from rheumatology as he could not afford Enbrel.  He is requesting something to help with pain so he does not take too much NSAIDs over-the-counter or too much Tylenol  His quality of life is reduced right now given the pain and limitations.  He works in [employer].  Past Medical History:  Diagnosis Date   Anger    Anxiety    Arthritis    Humerus fracture    left   Hypertension    Osteoarthritis of hand    Sleep apnea 2016   no cpap, per pt   Smoker    Wears contact lenses    Current Outpatient Medications on File Prior to Visit  Medication Sig Dispense Refill   acetaminophen (TYLENOL) 500 MG tablet Take 2 tablets (1,000 mg total) by mouth every 6 (six) hours as needed. 100 tablet 2   amLODipine (NORVASC) 5 MG tablet Take 1 tablet (5 mg total) by mouth daily. 90 tablet 1   meloxicam (MOBIC) 15 MG tablet Take 1 tablet by mouth once daily 30 tablet 0   omeprazole (PRILOSEC) 40 MG capsule Take 1 capsule (40 mg total) by mouth  daily. 30 capsule 2   No current facility-administered medications on file prior to visit.   Past Surgical History:  Procedure Laterality Date   COLONOSCOPY  2012   Dr. Elnoria Chapman   KNEE ARTHROSCOPY     right x 3   ORIF HUMERUS FRACTURE Left 02/04/2020   Procedure: OPEN REDUCTION INTERNAL FIXATION (ORIF) HUMERAL SHAFT FRACTURE;  Surgeon: Troy Apley, MD;  Location: [facility];  Service: Orthopedics;  Laterality: Left;  block   SHOULDER ARTHROSCOPY     left x 2, and RTC     The following portions of the patient's history were reviewed and updated as appropriate: allergies, current medications, past family history, past medical history, past social history, past surgical history and problem list.  ROS Otherwise as in subjective above     Objective: BP (!) 182/108 (BP Location: Right Arm, Patient Position: Sitting)    Pulse 72    Ht 5\' 7"  (1.702 m)    Wt 166 lb (75.3 kg)    SpO2 98%    BMI 26.00 kg/m   Wt Readings from Last 3 Encounters:  12/26/20 166 lb (75.3 kg)  11/06/20 165 lb  6.4 oz (75 kg)  02/04/20 177 lb 4 oz (80.4 kg)   BP Readings from Last 3 Encounters:  12/26/20 (!) 182/108  11/06/20 110/70  02/04/20 111/73    General appearance: alert, no distress, well developed, well nourished Sclera normal appearing, no jaundice Neck: supple, no lymphadenopathy, no thyromegaly, no masses Heart: RRR, normal S1, S2, no murmurs Lungs: decreased lung sounds throughout, no wheezes, rhonchi, or rales Abdomen: + bs, soft, mild epigastric and central abdomen tendnerss, otherwise non tender, non distended, no masses, no hepatomegaly, no splenomegaly Pulses: 2+ radial pulses, 2+ pedal pulses, normal cap refill Ext: no edema    Assessment: Encounter Diagnoses  Name Primary?   Diarrhea, unspecified type Yes   Tobacco use disorder    Abnormal lung field    Essential hypertension    High risk medication use    Paresthesia of both hands    Cyclic citrullinated  peptide (CCP) antibody positive    Rheumatoid arthritis, involving unspecified site, unspecified whether rheumatoid factor present (HCC)    Abdominal discomfort    Weight loss, unintentional       Plan: We discussed his symptoms.  Differential could be infection, ulcer, pancreatitis, or other.  Given the ongoing symptoms begin Cipro trial.  Continue omeprazole and Zofran.  Add Pepto-Bismol over-the-counter for the next week.  Tentative referral placed to GI, back to Dr. Elnoria Chapman  Hypertension-continue current medications.  He notes home blood pressure readings are normal.  He is in pain today so we will plan to recheck next visit  Abnormal lung sounds, smoker - advised cessation.  Advised he go for chest xray  Unintentional weight loss - possible related to poor appetite, loose stools.   If not improving after round of Cipro, consider imaging.   We will go ahead and refer to gastroenterology  Arthritis -I reviewed last rheumatology notes with +CCP labs, and diagnosis of RA.  He was on Enbrel for about 6 months then could no longer afford this.  Script for short term use of nor co today to help with pain until he gets back in with rheumatology.   Given recent alcohols use, advised he stop or cut back on alcohol and see rheumatology soon.   I avoided prednisone given potential for ulcer.  Refer to different rheumatologist at his request for other opinions.  Advised he is due for physical and screening in general, including heart disease screening, updated colonoscopy, chest CT lung cancer screen.  He will consider.   Patient Instructions  Recommendations Continue Omeprazole Use Zofran as needed for nauseated Begin Cipro antibiotic If the diarrhea hasn't improved within 1 week, I need you to see gastroenterology We put in referral to different rheumatologist today Expect a call back on this You can use Norco/Hydrocodone short term for pain   Troy Chapman was seen today for diarrhea and  arthritis.  Diagnoses and all orders for this visit:  Diarrhea, unspecified type -     Ambulatory referral to Gastroenterology  Tobacco use disorder  Abnormal lung field  Essential hypertension  High risk medication use  Paresthesia of both hands  Cyclic citrullinated peptide (CCP) antibody positive -     Ambulatory referral to Rheumatology  Rheumatoid arthritis, involving unspecified site, unspecified whether rheumatoid factor present [facility]) -     Ambulatory referral to Rheumatology  Abdominal discomfort -     Ambulatory referral to Gastroenterology  Weight loss, unintentional -     Ambulatory referral to Gastroenterology  Other orders -  omeprazole (PRILOSEC) 40 MG capsule; Take 1 capsule (40 mg total) by mouth daily. -     HYDROcodone-acetaminophen (NORCO) 5-325 MG tablet; Take 1 tablet by mouth every 12 (twelve) hours as needed. -     ondansetron (ZOFRAN ODT) 4 MG disintegrating tablet; Take 1 tablet (4 mg total) by mouth every 8 (eight) hours as needed for nausea or vomiting. -     ciprofloxacin (CIPRO) 500 MG tablet; Take 1 tablet (500 mg total) by mouth 2 (two) times daily for 5 days.   Follow up: pending referrals

## 2020-12-26 NOTE — Patient Instructions (Signed)
Recommendations Continue Omeprazole Use Zofran as needed for nauseated Begin Cipro antibiotic If the diarrhea hasn't improved within 1 week, I need you to see gastroenterology We put in referral to different rheumatologist today Expect a call back on this You can use Norco/Hydrocodone short term for pain

## 2020-12-27 ENCOUNTER — Other Ambulatory Visit: Payer: Self-pay | Admitting: Medical

## 2020-12-27 ENCOUNTER — Telehealth: Payer: Self-pay | Admitting: Medical

## 2020-12-27 MED ORDER — HYDROCODONE-ACETAMINOPHEN 5-325 MG PO TABS: 1.0000 | ORAL_TABLET | Freq: Four times a day (QID) | ORAL | 0 refills | Status: DC | PRN

## 2020-12-27 NOTE — Telephone Encounter (Signed)
The pharmacy called and said per [place] law they will not be able to fill pts Hydrocodone for what it was written. They will only be able to do it for a 5 day supply but if he needs more after that they can fill if for 5 more days but they will need a new script. Pt is aware of the issue

## 2021-01-24 DIAGNOSIS — Z1211 Encounter for screening for malignant neoplasm of colon: Secondary | ICD-10-CM | POA: Diagnosis not present

## 2021-01-24 DIAGNOSIS — R140 Abdominal distension (gaseous): Secondary | ICD-10-CM | POA: Diagnosis not present

## 2021-01-24 DIAGNOSIS — R197 Diarrhea, unspecified: Secondary | ICD-10-CM | POA: Diagnosis not present

## 2021-01-24 DIAGNOSIS — R634 Abnormal weight loss: Secondary | ICD-10-CM | POA: Diagnosis not present

## 2021-02-16 ENCOUNTER — Telehealth: Payer: Self-pay

## 2021-02-16 ENCOUNTER — Other Ambulatory Visit: Payer: Self-pay | Admitting: Medical

## 2021-02-16 MED ORDER — HYDROCODONE-ACETAMINOPHEN 5-325 MG PO TABS: 1.0000 | ORAL_TABLET | Freq: Four times a day (QID) | ORAL | 0 refills | Status: DC | PRN

## 2021-02-16 NOTE — Telephone Encounter (Signed)
Pt. Called stating he needs a refill on his Hydrocodone only has 1 left and does want to go the weekend with out it. He twisted knee recently and its bothering him now. Last apt was 12/26/20.

## 2021-03-05 ENCOUNTER — Telehealth: Payer: Self-pay | Admitting: Medical

## 2021-03-05 NOTE — Telephone Encounter (Signed)
Pt called and left message requesting refills on pain medication. Pt can be reached at [phone].

## 2021-03-07 ENCOUNTER — Telehealth: Payer: Self-pay | Admitting: *Deleted

## 2021-03-07 ENCOUNTER — Other Ambulatory Visit: Payer: Self-pay | Admitting: Medical

## 2021-03-07 MED ORDER — HYDROCODONE-ACETAMINOPHEN 5-325 MG PO TABS: 1.0000 | ORAL_TABLET | Freq: Four times a day (QID) | ORAL | 0 refills | Status: DC | PRN

## 2021-03-07 NOTE — Telephone Encounter (Signed)
Patient called requesting a phone call back from you. I explained that prescribers do not typically call back patient [id number] the number of pain pills that they want called in. He was upset that only #20 were called in of his hydrocodone-said that barely lasts him 10 days. He said he takes 1 daily and sometimes another at night. If was to take it as prescribed q6hrs it would not even last 6 days. He is asking for at least #30 and would like to chat with you to explain this. I offered him an appointment.

## 2021-03-07 NOTE — Telephone Encounter (Signed)
Called patient back and let him know-he verbalized understanding.

## 2021-03-16 ENCOUNTER — Telehealth: Payer: Self-pay

## 2021-03-16 NOTE — Telephone Encounter (Signed)
P.A. HYDROCODONE/ACET

## 2021-03-20 NOTE — Telephone Encounter (Signed)
Additional questions answered and submitted

## 2021-03-24 NOTE — Telephone Encounter (Signed)
P.A. approved til 09/16/21

## 2021-04-09 DIAGNOSIS — M25561 Pain in right knee: Secondary | ICD-10-CM | POA: Diagnosis not present

## 2021-04-09 DIAGNOSIS — L4050 Arthropathic psoriasis, unspecified: Secondary | ICD-10-CM | POA: Diagnosis not present

## 2021-04-09 DIAGNOSIS — L409 Psoriasis, unspecified: Secondary | ICD-10-CM | POA: Diagnosis not present

## 2021-04-09 DIAGNOSIS — Z79899 Other long term (current) drug therapy: Secondary | ICD-10-CM | POA: Diagnosis not present

## 2021-04-09 DIAGNOSIS — M1990 Unspecified osteoarthritis, unspecified site: Secondary | ICD-10-CM | POA: Diagnosis not present

## 2021-04-09 DIAGNOSIS — M79643 Pain in unspecified hand: Secondary | ICD-10-CM | POA: Diagnosis not present

## 2021-04-09 DIAGNOSIS — M7989 Other specified soft tissue disorders: Secondary | ICD-10-CM | POA: Diagnosis not present

## 2021-05-11 ENCOUNTER — Other Ambulatory Visit: Payer: Self-pay | Admitting: Medical

## 2021-06-20 DIAGNOSIS — M25562 Pain in left knee: Secondary | ICD-10-CM | POA: Diagnosis not present

## 2021-06-20 DIAGNOSIS — Z79899 Other long term (current) drug therapy: Secondary | ICD-10-CM | POA: Diagnosis not present

## 2021-06-20 DIAGNOSIS — M25439 Effusion, unspecified wrist: Secondary | ICD-10-CM | POA: Diagnosis not present

## 2021-06-20 DIAGNOSIS — M2550 Pain in unspecified joint: Secondary | ICD-10-CM | POA: Diagnosis not present

## 2021-06-20 DIAGNOSIS — L409 Psoriasis, unspecified: Secondary | ICD-10-CM | POA: Diagnosis not present

## 2021-06-20 DIAGNOSIS — M1990 Unspecified osteoarthritis, unspecified site: Secondary | ICD-10-CM | POA: Diagnosis not present

## 2021-06-20 DIAGNOSIS — L4050 Arthropathic psoriasis, unspecified: Secondary | ICD-10-CM | POA: Diagnosis not present

## 2021-06-20 DIAGNOSIS — M79643 Pain in unspecified hand: Secondary | ICD-10-CM | POA: Diagnosis not present

## 2021-07-09 DIAGNOSIS — M069 Rheumatoid arthritis, unspecified: Secondary | ICD-10-CM | POA: Diagnosis not present

## 2021-07-09 DIAGNOSIS — L4050 Arthropathic psoriasis, unspecified: Secondary | ICD-10-CM | POA: Diagnosis not present

## 2021-07-15 ENCOUNTER — Other Ambulatory Visit: Payer: Self-pay | Admitting: Medical

## 2021-07-16 ENCOUNTER — Other Ambulatory Visit: Payer: Self-pay | Admitting: Physician Assistant

## 2021-07-16 DIAGNOSIS — M19042 Primary osteoarthritis, left hand: Secondary | ICD-10-CM

## 2021-07-16 MED ORDER — MELOXICAM 15 MG PO TABS: 15.0000 mg | ORAL_TABLET | Freq: Every day | ORAL | 0 refills | Status: DC

## 2021-07-25 ENCOUNTER — Encounter: Payer: Self-pay | Admitting: Medical

## 2021-08-10 ENCOUNTER — Other Ambulatory Visit: Payer: Self-pay | Admitting: Physician Assistant

## 2021-08-10 NOTE — Telephone Encounter (Signed)
Request for refill on Meloxicam last apt was 12/26/20 next apt is 08/15/21.

## 2021-08-15 ENCOUNTER — Ambulatory Visit: Payer: 59 | Admitting: Medical

## 2021-08-15 VITALS — BP 120/80 | HR 67 | Wt 160.2 lb

## 2021-08-15 DIAGNOSIS — R0989 Other specified symptoms and signs involving the circulatory and respiratory systems: Secondary | ICD-10-CM

## 2021-08-15 DIAGNOSIS — R109 Unspecified abdominal pain: Secondary | ICD-10-CM | POA: Diagnosis not present

## 2021-08-15 DIAGNOSIS — Z136 Encounter for screening for cardiovascular disorders: Secondary | ICD-10-CM

## 2021-08-15 DIAGNOSIS — Z13220 Encounter for screening for lipoid disorders: Secondary | ICD-10-CM

## 2021-08-15 DIAGNOSIS — F17200 Nicotine dependence, unspecified, uncomplicated: Secondary | ICD-10-CM

## 2021-08-15 DIAGNOSIS — I10 Essential (primary) hypertension: Secondary | ICD-10-CM | POA: Diagnosis not present

## 2021-08-15 DIAGNOSIS — R69 Illness, unspecified: Secondary | ICD-10-CM | POA: Diagnosis not present

## 2021-08-15 DIAGNOSIS — K529 Noninfective gastroenteritis and colitis, unspecified: Secondary | ICD-10-CM

## 2021-08-15 DIAGNOSIS — Z125 Encounter for screening for malignant neoplasm of prostate: Secondary | ICD-10-CM

## 2021-08-15 DIAGNOSIS — G8929 Other chronic pain: Secondary | ICD-10-CM

## 2021-08-15 HISTORY — DX: Other specified symptoms and signs involving the circulatory and respiratory systems: R09.89

## 2021-08-15 HISTORY — DX: Noninfective gastroenteritis and colitis, unspecified: K52.9

## 2021-08-15 HISTORY — DX: Encounter for screening for lipoid disorders: Z13.220

## 2021-08-15 HISTORY — DX: Encounter for screening for cardiovascular disorders: Z13.6

## 2021-08-15 HISTORY — DX: Encounter for screening for malignant neoplasm of prostate: Z12.5

## 2021-08-15 HISTORY — DX: Other chronic pain: G89.29

## 2021-08-15 NOTE — Patient Instructions (Signed)
Recommendations Continue your current medications Consider beginning over-the-counter pancreatic enzymes  Continue your probiotic/balance of nature Avoid foods that can trigger bowel issues such as spicy foods, hot sauce, peppers, fried foods, onions, high sugar foods, acidic things like citrus and tomatoes Drink at least 80 to 100 ounces of water a day I recommend you get a shingles and pneumonia vaccine at your pharmacy Expect a call about doing the CT coronary heart test.  This should be $95 cash

## 2021-08-15 NOTE — Progress Notes (Signed)
Subjective:  Troy Chapman is a 65 y.o. male who presents for Chief Complaint  Patient presents with   med check    Med check, no other concerns     Medical team: Dr Jeani Hawking, GI Dr. Casimer Lanius, rheumatology Jaiyanna Safran, Kermit Balo, PA-C here for primary care  Here for med check  Hypertension-compliant with amlodipine daily without complaint  He sees rheumatology for arthritis issues which are severe.  He is working with them and going to be referred to chronic pain specialist soon as well.  His pain levels are such that he requires narcotic pain medication  He still has chronic abdominal issues and loose stools on a regular basis he has seen Dr. Elnoria Howard.  They recommended colonoscopy but he cannot afford that right now.  He has a Statistician with the $7500 deductible.  He continues to smoke  He self medicates with alcohol to help with the pain  No other aggravating or relieving factors.    No other c/o.  The following portions of the patient's history were reviewed and updated as appropriate: allergies, current medications, past family history, past medical history, past social history, past surgical history and problem list.  ROS Otherwise as in subjective above    Objective: BP 120/80   Pulse 67   Wt 160 lb 3.2 oz (72.7 kg)   BMI 25.09 kg/m   General appearance: alert, no distress, well developed, well nourished Neck: supple, no lymphadenopathy, no thyromegaly, no masses Heart: RRR, normal S1, S2, no murmurs Lungs: decreased breath sounds throughout ,faint wheezes, rhonchi, or rales Abdomen: +bs, soft, non tender, non distended, no masses, no hepatomegaly, no splenomegaly Pulses: 2+ radial pulses, 2+ pedal pulses, normal cap refill Ext: no edema MSK: Grossly abnormal bony arthritic changes in hands throughout    Assessment: Encounter Diagnoses  Name Primary?   Essential hypertension Yes   Screening for heart disease    Screening for  lipid disorders    Screening for prostate cancer    Tobacco use disorder    Chronic diarrhea    Chronic abdominal pain    Abnormal lung sounds      Plan: Hypertension-continue current medication  I reviewed labs from March 2023 done at rheumatology.  Liver and kidney tests were normal.  Blood counts are stable.  Additional labs as below today  We will get him set up for CT coronary test for heart disease screen and to get a picture of the lungs  He has abnormal lung exam, very likely COPD.  He declines any inhalers today.  Advised smoking cessation.  Chronic abdominal pain, chronic diarrhea-begin trial of over-the-counter pancreatic enzymes.  He still has deciding on whether to move forward with colonoscopy with gastroenterology.  I recommend he get the pneumonia and shingles vaccine at the pharmacy  Troy Chapman was seen today for med check.  Diagnoses and all orders for this visit:  Essential hypertension -     CT CARDIAC SCORING ([facility] LOCATIONS ONLY); Future  Screening for heart disease -     CT CARDIAC SCORING ([facility] LOCATIONS ONLY); Future  Screening for lipid disorders -     Lipid panel  Screening for prostate cancer -     PSA  Tobacco use disorder  Chronic diarrhea  Chronic abdominal pain  Abnormal lung sounds   Follow up: pending labs

## 2021-08-16 ENCOUNTER — Other Ambulatory Visit: Payer: Self-pay | Admitting: Medical

## 2021-08-16 LAB — PSA: Prostate Specific Ag, Serum: 0.1 ng/mL (ref 0.0–4.0)

## 2021-08-16 LAB — LIPID PANEL
Chol/HDL Ratio: 2.1 ratio (ref 0.0–5.0)
Cholesterol, Total: 156 mg/dL (ref 100–199)
HDL: 75 mg/dL (ref >39)
LDL Chol Calc (NIH): 68 mg/dL (ref 0–99)
Triglycerides: 69 mg/dL (ref 0–149)
VLDL Cholesterol Cal: 13 mg/dL (ref 5–40)

## 2021-08-16 MED ORDER — AMLODIPINE BESYLATE 5 MG PO TABS: 5.0000 mg | ORAL_TABLET | Freq: Every day | ORAL | 3 refills | Status: DC

## 2021-08-16 MED ORDER — MELOXICAM 15 MG PO TABS
15.0000 mg | ORAL_TABLET | Freq: Every day | ORAL | 0 refills | Status: DC
Start: 2021-08-16 — End: 2022-02-13

## 2021-08-16 MED ORDER — OMEPRAZOLE 40 MG PO CPDR: 40.0000 mg | DELAYED_RELEASE_CAPSULE | Freq: Every day | ORAL | 3 refills | Status: DC

## 2021-09-07 ENCOUNTER — Ambulatory Visit
Admission: RE | Admit: 2021-09-07 | Discharge: 2021-09-07 | Disposition: A | Discharge status: Home / Self Care | Payer: Self-pay | Source: Ambulatory Visit | Attending: Medical | Admitting: Medical

## 2021-09-07 DIAGNOSIS — I10 Essential (primary) hypertension: Secondary | ICD-10-CM

## 2021-09-07 DIAGNOSIS — Z136 Encounter for screening for cardiovascular disorders: Secondary | ICD-10-CM

## 2021-09-10 ENCOUNTER — Other Ambulatory Visit: Payer: Self-pay | Admitting: Medical

## 2021-09-10 ENCOUNTER — Telehealth: Payer: Self-pay | Admitting: Medical

## 2021-09-10 MED ORDER — ROSUVASTATIN CALCIUM 10 MG PO TABS: 10.0000 mg | ORAL_TABLET | Freq: Every day | ORAL | 3 refills | Status: DC

## 2021-09-10 NOTE — Telephone Encounter (Signed)
Please call [facility] imaging.  I am sending a lot of people for CT coronary calcium score.  We have been told from the imaging center that this is a $95 cash pay test.  As in this patient's case I am getting correspondence from the insurance company about test not being covered by insurance.  This should have never been filed to insurance as it was a cash pay test.  I had the same issue when I had the same test myself personally.  It is very frustrating to send people letting them know that this is a cash pay arrangement, but then get a bill and insurance denial  I am sure the patient is frustrated like I am that they are getting denials and bills from insurance company when this should not be billed to [employer] from the outset.  This is at least the third or fourth time we called about this very issue in the last year and half.  Something is not straightened out at check-in at [facility] imaging for this to keep happening

## 2021-10-16 DIAGNOSIS — L409 Psoriasis, unspecified: Secondary | ICD-10-CM | POA: Diagnosis not present

## 2021-10-16 DIAGNOSIS — M8589 Other specified disorders of bone density and structure, multiple sites: Secondary | ICD-10-CM | POA: Diagnosis not present

## 2021-10-16 DIAGNOSIS — L4050 Arthropathic psoriasis, unspecified: Secondary | ICD-10-CM | POA: Diagnosis not present

## 2021-10-16 DIAGNOSIS — M25439 Effusion, unspecified wrist: Secondary | ICD-10-CM | POA: Diagnosis not present

## 2021-10-16 DIAGNOSIS — M2550 Pain in unspecified joint: Secondary | ICD-10-CM | POA: Diagnosis not present

## 2021-10-16 DIAGNOSIS — Z79899 Other long term (current) drug therapy: Secondary | ICD-10-CM | POA: Diagnosis not present

## 2021-10-16 DIAGNOSIS — M79643 Pain in unspecified hand: Secondary | ICD-10-CM | POA: Diagnosis not present

## 2021-10-16 DIAGNOSIS — M0579 Rheumatoid arthritis with rheumatoid factor of multiple sites without organ or systems involvement: Secondary | ICD-10-CM | POA: Diagnosis not present

## 2021-10-16 DIAGNOSIS — M1990 Unspecified osteoarthritis, unspecified site: Secondary | ICD-10-CM | POA: Diagnosis not present

## 2021-10-29 ENCOUNTER — Ambulatory Visit: Payer: 59 | Admitting: Medical

## 2021-10-29 ENCOUNTER — Encounter: Payer: Self-pay | Admitting: Medical

## 2021-10-29 VITALS — BP 110/60 | HR 61 | Wt 160.2 lb

## 2021-10-29 DIAGNOSIS — J988 Other specified respiratory disorders: Secondary | ICD-10-CM

## 2021-10-29 DIAGNOSIS — I2510 Atherosclerotic heart disease of native coronary artery without angina pectoris: Secondary | ICD-10-CM

## 2021-10-29 DIAGNOSIS — Z7185 Encounter for immunization safety counseling: Secondary | ICD-10-CM

## 2021-10-29 DIAGNOSIS — E785 Hyperlipidemia, unspecified: Secondary | ICD-10-CM

## 2021-10-29 DIAGNOSIS — I700 Atherosclerosis of aorta: Secondary | ICD-10-CM

## 2021-10-29 DIAGNOSIS — R911 Solitary pulmonary nodule: Secondary | ICD-10-CM

## 2021-10-29 DIAGNOSIS — R69 Illness, unspecified: Secondary | ICD-10-CM | POA: Diagnosis not present

## 2021-10-29 DIAGNOSIS — Z23 Encounter for immunization: Secondary | ICD-10-CM

## 2021-10-29 DIAGNOSIS — F17200 Nicotine dependence, unspecified, uncomplicated: Secondary | ICD-10-CM

## 2021-10-29 HISTORY — DX: Hyperlipidemia, unspecified: E78.5

## 2021-10-29 HISTORY — DX: Atherosclerosis of aorta: I70.0

## 2021-10-29 HISTORY — DX: Atherosclerotic heart disease of native coronary artery without angina pectoris: I25.10

## 2021-10-29 HISTORY — DX: Solitary pulmonary nodule: R91.1

## 2021-10-29 LAB — ALT: ALT: 19 IU/L (ref 0–44)

## 2021-10-29 MED ORDER — BREZTRI AEROSPHERE 160-9-4.8 MCG/ACT IN AERO: 2.0000 | INHALATION_SPRAY | Freq: Two times a day (BID) | RESPIRATORY_TRACT | 0 refills | Status: DC

## 2021-10-29 MED ORDER — AZITHROMYCIN 250 MG PO TABS: ORAL_TABLET | ORAL | 0 refills | Status: DC

## 2021-10-29 NOTE — Progress Notes (Signed)
Subjective:  Troy Chapman is a 66 y.o. male who presents for Chief Complaint  Patient presents with   follow-up on medication    Follow-up on medication. Not fasting for cholesterol     Here to follow-up on medicine from last visit.  We had him go for CT coronary score in August.  We had called about those results and asked him to get back on cholesterol medication.  He is taking Crestor without complaint.  Current tobacco use is 1ppd.   He notes after having the CT in august, ended up getting sick, bad cold.   Had very bad congestion, cough, respiratory tract infection.  He had congestion for about a month.   Still feels a little congestion.   No other aggravating or relieving factors.    No other c/o.  Past Medical History:  Diagnosis Date   Anger    Anxiety    Arthritis    Humerus fracture    left   Hypertension    Osteoarthritis of hand    Sleep apnea 2016   no cpap, per pt   Smoker    Wears contact lenses    Current Outpatient Medications on File Prior to Visit  Medication Sig Dispense Refill   amLODipine (NORVASC) 5 MG tablet Take 1 tablet (5 mg total) by mouth daily. 90 tablet 3   meloxicam (MOBIC) 15 MG tablet Take 1 tablet (15 mg total) by mouth daily. 90 tablet 0   omeprazole (PRILOSEC) 40 MG capsule Take 1 capsule (40 mg total) by mouth daily. 90 capsule 3   rosuvastatin (CRESTOR) 10 MG tablet Take 1 tablet (10 mg total) by mouth daily. 90 tablet 3   No current facility-administered medications on file prior to visit.     The following portions of the patient's history were reviewed and updated as appropriate: allergies, current medications, past family history, past medical history, past social history, past surgical history and problem list.  ROS Otherwise as in subjective above    Objective: BP 110/60   Pulse 61   Wt 160 lb 3.2 oz (72.7 kg)   BMI 25.09 kg/m   General appearance: alert, no distress, well developed, well nourished Neck:  supple, no lymphadenopathy, no thyromegaly, no masses, no bruits Heart: RRR, normal S1, S2, no murmurs Lungs: Decreased breath sounds in general, few scattered wheezes, no rhonchi, or rales Pulses: 2+ radial pulses, 2+ pedal pulses, normal cap refill Ext: no edema    CT Coronary heart score 09/07/21:  IMPRESSION: 1. Total calcium score of 153 is at percentile 65 for subjects of the same age, gender, and race/ethnicity. 2. Tiny solid pulmonary nodules which are most pronounced at the lung bases, largest measures 3 mm, likely sequela of infection or aspiration. No follow-up needed if patient is low-risk (and has no known or suspected primary neoplasm). Non-contrast chest CT can be considered in 12 months if patient is high-risk. This recommendation follows the consensus statement: Guidelines for Management of Incidental Pulmonary Nodules Detected on CT Images: From the Fleischner Society 2017; Radiology 2017; 284:228-243. 3. Mild aortic valve calcifications, findings can be seen the setting of aortic sclerosis or stenosis. Correlate with echocardiography. 4. Mild aortic Atherosclerosis (ICD10-I70.0).    Assessment: Encounter Diagnoses  Name Primary?   Coronary artery disease involving native coronary artery of native heart without angina pectoris Yes   Hyperlipidemia, unspecified hyperlipidemia type    Tobacco use disorder    Aortic atherosclerosis (HCC)    Pulmonary nodule  Need for Tdap vaccination    Vaccine counseling    Respiratory tract infection      Plan: We discussed the CT findings from September 07, 2021 as noted above in assessment.  He is compliant with statin, updated labs today.  We discussed the risk of heart disease.  Advised to quit tobacco completely.  Consider cardiology consult  Aortic atherosclerosis-continue statin, low-cholesterol diet and recommended he quit tobacco  Hyperlipidemia-updated liver lab today since starting Crestor.  Lipid profile did  not get back to months ago but the goal is to reduce risk going forward  Pulmonary nodule noted on CT score, radiology advise repeat CT in 1 year which we will plan to do  Respiratory tract infection, long-term tobacco use-begin round of Z-Pak antibiotic and the inhaler Breztri 2 puffs twice daily.  Rinse mouth after water after use of inhaler  Counseled on the Tdap (tetanus, diptheria, and acellular pertussis) vaccine.  Vaccine information sheet given. Tdap vaccine given after consent obtained.  I do not have other vaccines on file.  You are due for yearly flu shot, pneumococcal pneumonia vaccine and shingles vaccine.  Let us know if you want to pursue these vaccines as well   Jermario was seen today for follow-up on medication.  Diagnoses and all orders for this visit:  Coronary artery disease involving native coronary artery of native heart without angina pectoris -     ALT  Hyperlipidemia, unspecified hyperlipidemia type  Tobacco use disorder  Aortic atherosclerosis (HCC) -     ALT  Pulmonary nodule  Need for Tdap vaccination  Vaccine counseling  Respiratory tract infection  Other orders -     azithromycin (ZITHROMAX) 250 MG tablet; 2 tablets day 1, then 1 tablet days 2-4 -     Budeson-Glycopyrrol-Formoterol (BREZTRI AEROSPHERE) 160-9-4.8 MCG/ACT AERO; Inhale 2 puffs into the lungs 2 (two) times daily.   Follow up: pending labs

## 2021-10-29 NOTE — Patient Instructions (Signed)
We discussed the CT findings from September 07, 2021 as noted above in assessment.  He is compliant with statin, updated labs today.  We discussed the risk of heart disease.  Advised to quit tobacco completely.  Consider cardiology consult  Aortic atherosclerosis-continue statin, low-cholesterol diet and recommended he quit tobacco  Hyperlipidemia-updated liver lab today since starting Crestor.  Lipid profile did not get back to months ago but the goal is to reduce risk going forward  Pulmonary nodule noted on CT score, radiology advise repeat CT in 1 year which we will plan to do  Respiratory tract infection, long-term tobacco use-begin round of Z-Pak antibiotic and the inhaler Breztri 2 puffs twice daily.  Rinse mouth after water after use of inhaler  Counseled on the Tdap (tetanus, diptheria, and acellular pertussis) vaccine.  Vaccine information sheet given. Tdap vaccine given after consent obtained.  I do not have other vaccines on file.  You are due for yearly flu shot, pneumococcal pneumonia vaccine and shingles vaccine.  Let us know if you want to pursue these vaccines as well

## 2021-10-29 NOTE — Addendum Note (Signed)
Addended by: Minette Headland A on: 10/29/2021 10:10 AM   Modules accepted: Orders

## 2021-12-20 DIAGNOSIS — Z791 Long term (current) use of non-steroidal anti-inflammatories (NSAID): Secondary | ICD-10-CM | POA: Diagnosis not present

## 2021-12-20 DIAGNOSIS — E785 Hyperlipidemia, unspecified: Secondary | ICD-10-CM | POA: Diagnosis not present

## 2021-12-20 DIAGNOSIS — R69 Illness, unspecified: Secondary | ICD-10-CM | POA: Diagnosis not present

## 2021-12-20 DIAGNOSIS — Z8249 Family history of ischemic heart disease and other diseases of the circulatory system: Secondary | ICD-10-CM | POA: Diagnosis not present

## 2021-12-20 DIAGNOSIS — M1990 Unspecified osteoarthritis, unspecified site: Secondary | ICD-10-CM | POA: Diagnosis not present

## 2021-12-20 DIAGNOSIS — K219 Gastro-esophageal reflux disease without esophagitis: Secondary | ICD-10-CM | POA: Diagnosis not present

## 2021-12-20 DIAGNOSIS — M069 Rheumatoid arthritis, unspecified: Secondary | ICD-10-CM | POA: Diagnosis not present

## 2021-12-20 DIAGNOSIS — I10 Essential (primary) hypertension: Secondary | ICD-10-CM | POA: Diagnosis not present

## 2022-02-13 ENCOUNTER — Other Ambulatory Visit: Payer: Self-pay | Admitting: Medical

## 2022-02-13 DIAGNOSIS — Z79899 Other long term (current) drug therapy: Secondary | ICD-10-CM | POA: Diagnosis not present

## 2022-02-13 DIAGNOSIS — M25439 Effusion, unspecified wrist: Secondary | ICD-10-CM | POA: Diagnosis not present

## 2022-02-13 DIAGNOSIS — M2550 Pain in unspecified joint: Secondary | ICD-10-CM | POA: Diagnosis not present

## 2022-02-13 DIAGNOSIS — M8580 Other specified disorders of bone density and structure, unspecified site: Secondary | ICD-10-CM | POA: Diagnosis not present

## 2022-02-13 DIAGNOSIS — M0579 Rheumatoid arthritis with rheumatoid factor of multiple sites without organ or systems involvement: Secondary | ICD-10-CM | POA: Diagnosis not present

## 2022-02-13 DIAGNOSIS — L4050 Arthropathic psoriasis, unspecified: Secondary | ICD-10-CM | POA: Diagnosis not present

## 2022-02-13 DIAGNOSIS — M79643 Pain in unspecified hand: Secondary | ICD-10-CM | POA: Diagnosis not present

## 2022-02-13 DIAGNOSIS — M25562 Pain in left knee: Secondary | ICD-10-CM | POA: Diagnosis not present

## 2022-02-13 DIAGNOSIS — M1990 Unspecified osteoarthritis, unspecified site: Secondary | ICD-10-CM | POA: Diagnosis not present

## 2022-02-13 DIAGNOSIS — L409 Psoriasis, unspecified: Secondary | ICD-10-CM | POA: Diagnosis not present

## 2022-02-20 DIAGNOSIS — L4050 Arthropathic psoriasis, unspecified: Secondary | ICD-10-CM | POA: Diagnosis not present

## 2022-02-20 DIAGNOSIS — M79643 Pain in unspecified hand: Secondary | ICD-10-CM | POA: Diagnosis not present

## 2022-02-20 DIAGNOSIS — M8580 Other specified disorders of bone density and structure, unspecified site: Secondary | ICD-10-CM | POA: Diagnosis not present

## 2022-02-20 DIAGNOSIS — M25439 Effusion, unspecified wrist: Secondary | ICD-10-CM | POA: Diagnosis not present

## 2022-02-20 DIAGNOSIS — Z79899 Other long term (current) drug therapy: Secondary | ICD-10-CM | POA: Diagnosis not present

## 2022-02-20 DIAGNOSIS — M2550 Pain in unspecified joint: Secondary | ICD-10-CM | POA: Diagnosis not present

## 2022-02-20 DIAGNOSIS — M0579 Rheumatoid arthritis with rheumatoid factor of multiple sites without organ or systems involvement: Secondary | ICD-10-CM | POA: Diagnosis not present

## 2022-02-20 DIAGNOSIS — M1990 Unspecified osteoarthritis, unspecified site: Secondary | ICD-10-CM | POA: Diagnosis not present

## 2022-02-20 DIAGNOSIS — L409 Psoriasis, unspecified: Secondary | ICD-10-CM | POA: Diagnosis not present

## 2022-02-20 DIAGNOSIS — M25561 Pain in right knee: Secondary | ICD-10-CM | POA: Diagnosis not present

## 2022-05-19 ENCOUNTER — Other Ambulatory Visit: Payer: Self-pay | Admitting: Medical

## 2022-06-01 ENCOUNTER — Telehealth: Payer: Self-pay | Admitting: Family Medicine

## 2022-06-01 NOTE — Telephone Encounter (Signed)
I spoke with patient after getting call from [employer] [employer] with message stating that pharmacist called regarding BP 206/127, and pt feeling dizzy.  He declined EMS, drove home.  Pt states he had been feeling during the week, mainly when at work, less dizzy today.  He admitted that he hadn't been taking his amlodipine, but reported having a full bottle at home. Advised patient to take 10mg  (2 pills), and if he had headache, chest pain, worsening dizziness, that he needed to call paramedics or go to ER. Pt doesn't have BP monitor at home. He gave me permission to speak with pharmacist who contacted [place] (she requested call back).  He states he has a job to finish on Monday (and then he will get paid), then plans to call us to schedule a visit.  When I called pharmacist back, she reported that she had given him a 5 mg amlodipine there.  That he had seemed to be in some pain and a little anxious.  He had picked up prescription for meloxicam earlier in the day.  Patient was pleasant, alert, oriented.  Agreed to recommendations and to follow-up.  He cited financial issues as reason for not taking his meds (but had a full bottle). He reports he does currently have insurance, and plans to f/u in the office.  Forwarding to [place] as FYI, and to pool so that somebody calls him to f/u and see how he is doing, and schedule a visit.

## 2022-06-03 DIAGNOSIS — R410 Disorientation, unspecified: Secondary | ICD-10-CM | POA: Diagnosis not present

## 2022-06-03 DIAGNOSIS — R42 Dizziness and giddiness: Secondary | ICD-10-CM | POA: Diagnosis not present

## 2022-06-03 DIAGNOSIS — I160 Hypertensive urgency: Secondary | ICD-10-CM | POA: Diagnosis not present

## 2022-06-03 DIAGNOSIS — I10 Essential (primary) hypertension: Secondary | ICD-10-CM | POA: Diagnosis not present

## 2022-06-03 NOTE — Telephone Encounter (Signed)
Pt is at [facility] and looks like currently admitted. Pt called back at 12:14pm today to inform me on my Voicemail he was headed to ER

## 2022-06-03 NOTE — Telephone Encounter (Signed)
Left message for pt to call me back 

## 2022-06-04 ENCOUNTER — Ambulatory Visit: Payer: 59 | Admitting: Medical

## 2022-06-04 ENCOUNTER — Encounter: Payer: Self-pay | Admitting: Medical

## 2022-06-04 VITALS — BP 150/100 | HR 73 | Ht 68.0 in | Wt 160.4 lb

## 2022-06-04 DIAGNOSIS — I700 Atherosclerosis of aorta: Secondary | ICD-10-CM

## 2022-06-04 DIAGNOSIS — Z91199 Patient's noncompliance with other medical treatment and regimen due to unspecified reason: Secondary | ICD-10-CM | POA: Diagnosis not present

## 2022-06-04 DIAGNOSIS — F17200 Nicotine dependence, unspecified, uncomplicated: Secondary | ICD-10-CM

## 2022-06-04 DIAGNOSIS — E785 Hyperlipidemia, unspecified: Secondary | ICD-10-CM

## 2022-06-04 DIAGNOSIS — R42 Dizziness and giddiness: Secondary | ICD-10-CM

## 2022-06-04 DIAGNOSIS — I10 Essential (primary) hypertension: Secondary | ICD-10-CM

## 2022-06-04 DIAGNOSIS — R918 Other nonspecific abnormal finding of lung field: Secondary | ICD-10-CM | POA: Diagnosis not present

## 2022-06-04 DIAGNOSIS — R911 Solitary pulmonary nodule: Secondary | ICD-10-CM | POA: Diagnosis not present

## 2022-06-04 DIAGNOSIS — I160 Hypertensive urgency: Secondary | ICD-10-CM

## 2022-06-04 MED ORDER — AMLODIPINE BESY-BENAZEPRIL HCL 5-20 MG PO CAPS: 1.0000 | ORAL_CAPSULE | Freq: Every day | ORAL | 2 refills | Status: DC

## 2022-06-04 MED ORDER — BREZTRI AEROSPHERE 160-9-4.8 MCG/ACT IN AERO: 2.0000 | INHALATION_SPRAY | Freq: Two times a day (BID) | RESPIRATORY_TRACT | 3 refills | Status: DC

## 2022-06-04 MED ORDER — ROSUVASTATIN CALCIUM 10 MG PO TABS: 10.0000 mg | ORAL_TABLET | Freq: Every day | ORAL | 0 refills | Status: DC

## 2022-06-04 NOTE — Progress Notes (Signed)
Subjective:  Troy Chapman is a 67 y.o. male who presents for Chief Complaint  Patient presents with   Hospitalization Follow-up    Seen at [facility] for dizziness x 1 week. BP 226/117 and then went to ER at [facility], was treated and released. Given meclizine for dizziness.      Here for emergency dept follow up  He went to the Emergency Department yesterday after being seen in urgent care for hypertensive urgency.  He had scan of the head, labs, was given meclizine for vertigo.  He ended up using meclizine yesterday and this did help.  She is blood pressure is a little better today.  He is back on his amlodipine.  He is not currently taking his cholesterol medicine.  Today he feels better but blood pressure still little elevated.  His dizziness is improved from the meclizine.  He denies chest pain, palpitations.  He does note that he has decreased exercise intolerance and sometimes has some breathing issues.  He has been on inhalers in the past but not recently.  He does continue to smoke.  He also just got insurance back again so he is ready to get back on some of his medicines  No other aggravating or relieving factors.    No other c/o.  Past Medical History:  Diagnosis Date   Anger    Anxiety    Arthritis    Humerus fracture    left   Hypertension    Osteoarthritis of hand    Sleep apnea 2016   no cpap, per pt   Smoker    Wears contact lenses    Current Outpatient Medications on File Prior to Visit  Medication Sig Dispense Refill   meloxicam (MOBIC) 15 MG tablet Take 1 tablet by mouth once daily 90 tablet 0   No current facility-administered medications on file prior to visit.    The following portions of the patient's history were reviewed and updated as appropriate: allergies, current medications, past family history, past medical history, past social history, past surgical history and problem list.  ROS Otherwise as in subjective above    Objective: BP (!)  150/100 (BP Location: Right Arm, Cuff Size: Normal)   Pulse 73   Ht 5\' 8"  (1.727 m)   Wt 160 lb 6.4 oz (72.8 kg)   SpO2 98%   BMI 24.39 kg/m   Wt Readings from Last 3 Encounters:  06/04/22 160 lb 6.4 oz (72.8 kg)  10/29/21 160 lb 3.2 oz (72.7 kg)  08/15/21 160 lb 3.2 oz (72.7 kg)    General appearance: alert, no distress, well developed, well nourished HEENT: normocephalic, sclerae anicteric, conjunctiva pink and moist, TMs pearly, nares patent, no discharge or erythema, pharynx normal Oral cavity: MMM, no lesions Neck: supple, no lymphadenopathy, no thyromegaly, no masses, no bruits Heart: RRR, normal S1, S2, no murmurs Lungs: decreased breath sounds in general, mild wheezing, no rhonchi, or rales Pulses: 2+ radial pulses, 2+ pedal pulses, normal cap refill Ext: no edema    Assessment: Encounter Diagnoses  Name Primary?   Hypertensive urgency Yes   Dizziness    Vertigo    Essential hypertension    Tobacco use disorder    Hyperlipidemia, unspecified hyperlipidemia type    Aortic atherosclerosis (HCC)    Abnormal CT lung screening    Noncompliance    Pulmonary nodule      Plan: I reviewed his recent hospital visit notes, labs and scans and recommendations.  CBC 06/03/22,  platelets 120, low, otherwise unremarkable CBC CMET unremarkable 06/03/22 CT head unremarkable 06/03/22  Lung cancer screen chest CT August 2023 showed CAD, atherosclerosis and emphysematous changes as well as pulmonary nodules   Hypertension, hypertensive urgency Lets stop amlodipine 5 and change to Lotrel (amlodipine benazepril 5/20 mg daily). 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 Your lung sounds are decreased today and there is some wheezing present Lets begin back on trial of Breztri maintenance inhaler, 2 puffs in the morning, 2 puffs in the evening Rinse your mouth out with water after using this You should feel some improvement after 15 to 20 minutes of using the medication If this medicine does help we can either use this or we could even try different medicine that is a nebulized treatment at home such as Pulmicort If you prefer, I can refer you to a pulmonologist/lung doctor for further evaluation and treatment recommendations   Please MyChart message, call back or recheck in the next month to let me know how your breathing is doing and how the blood pressures are looking   You will be due for repeat lung cancer chest CT screening in August 2024   Your scan last year showed cholesterol buildup in some of your coronary arteries and your aorta.  I recommend you get back on rosuvastatin Crestor.  You should have refills of this at the pharmacy.    Kaylin was seen today for hospitalization follow-up.  Diagnoses and all orders for this visit:  Hypertensive urgency  Dizziness  Vertigo  Essential hypertension  Tobacco use disorder  Hyperlipidemia, unspecified hyperlipidemia type  Aortic atherosclerosis (HCC)  Abnormal CT lung screening  Noncompliance  Pulmonary nodule  Other orders -     amLODipine-benazepril (LOTREL) 5-20 MG capsule; Take 1 capsule by mouth daily. -     rosuvastatin (CRESTOR) 10 MG tablet; Take 1 tablet (10 mg total) by mouth daily. -     Budeson-Glycopyrrol-Formoterol (BREZTRI AEROSPHERE) 160-9-4.8 MCG/ACT AERO; Inhale 2 puffs into the lungs 2 (two) times daily.   Follow up: 44mo

## 2022-06-04 NOTE — Patient Instructions (Addendum)
  Hypertension, hypertensive urgency Lets stop amlodipine 5 and change to Lotrel (amlodipine benazepril 5/20 mg daily). 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 due to long-term tobacco use Your lung sounds are decreased today and there is some wheezing present Lets begin back on trial of Breztri maintenance inhaler, 2 puffs in the morning, 2 puffs in the evening Rinse your mouth out with water after using this You should feel some improvement after 15 to 20 minutes of using the medication If this medicine does help we can either use this or we could even try different medicine that is a nebulized treatment at home such as Pulmicort If you prefer, I can refer you to a pulmonologist/lung doctor for further evaluation and treatment recommendations   Please MyChart message, call back or recheck in the next month to let me know how your breathing is doing and how the blood pressures are looking   You will be due for repeat lung cancer chest CT screening in August 2024   Your scan last year showed cholesterol buildup in some of your coronary arteries and your aorta.  I recommend you get back on rosuvastatin Crestor.  You should have refills of this at the pharmacy.

## 2022-06-11 ENCOUNTER — Telehealth: Payer: Self-pay | Admitting: Medical

## 2022-06-11 ENCOUNTER — Other Ambulatory Visit: Payer: Self-pay | Admitting: Medical

## 2022-06-11 ENCOUNTER — Inpatient Hospital Stay: Payer: 59 | Admitting: Medical

## 2022-06-11 MED ORDER — SCOPOLAMINE 1 MG/3DAYS TD PT72: 1.0000 | MEDICATED_PATCH | TRANSDERMAL | 0 refills | Status: DC

## 2022-06-11 MED ORDER — TRELEGY ELLIPTA 200-62.5-25 MCG/ACT IN AEPB: 1.0000 | INHALATION_SPRAY | Freq: Every day | RESPIRATORY_TRACT | 1 refills | Status: DC

## 2022-06-11 NOTE — Telephone Encounter (Signed)
Pharmacy called and states that breztri is not covered by his insruance The insurance will cover trelegy Please send to the [facility] [address] [place], [place] - [address]

## 2022-06-11 NOTE — Telephone Encounter (Signed)
Pt called and states that he was seen for vertigo and the medication he was given is making him sick and not working. He is requesting something else. Please advise pt at [phone]. Pt's pharmacy is [facility] in [address] in [place].

## 2022-06-12 ENCOUNTER — Telehealth: Payer: Self-pay | Admitting: Medical

## 2022-06-12 NOTE — Telephone Encounter (Signed)
Pt called & Trelegy cost [phone] with insurance.  I printed & activated discount card & called pharmacy & cost went down to $34 & I also checked on scopolamine patches & they cost $25. Called pt & informed.  He was very thankful.   Also he wanted me to let Vincenza Hews know he had another bad episode,  this time had tunnel vision, was like looking thru a window with pulsating rainbow circling around it, this really scared him, he didn't know if he was having a stroke or what.  Said that he thought he was going to black out, he couldn't drive, this lasted about an hour.  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

## 2022-06-14 NOTE — Telephone Encounter (Signed)
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  Told him to call if he has any other issues.

## 2022-06-25 ENCOUNTER — Other Ambulatory Visit: Payer: Self-pay | Admitting: Medical

## 2022-06-26 DIAGNOSIS — M2550 Pain in unspecified joint: Secondary | ICD-10-CM | POA: Diagnosis not present

## 2022-06-26 DIAGNOSIS — L409 Psoriasis, unspecified: Secondary | ICD-10-CM | POA: Diagnosis not present

## 2022-06-26 DIAGNOSIS — Z79899 Other long term (current) drug therapy: Secondary | ICD-10-CM | POA: Diagnosis not present

## 2022-06-26 DIAGNOSIS — L4050 Arthropathic psoriasis, unspecified: Secondary | ICD-10-CM | POA: Diagnosis not present

## 2022-06-26 DIAGNOSIS — M25439 Effusion, unspecified wrist: Secondary | ICD-10-CM | POA: Diagnosis not present

## 2022-06-26 DIAGNOSIS — M8580 Other specified disorders of bone density and structure, unspecified site: Secondary | ICD-10-CM | POA: Diagnosis not present

## 2022-06-26 DIAGNOSIS — M79643 Pain in unspecified hand: Secondary | ICD-10-CM | POA: Diagnosis not present

## 2022-06-26 DIAGNOSIS — M25562 Pain in left knee: Secondary | ICD-10-CM | POA: Diagnosis not present

## 2022-06-26 DIAGNOSIS — M1990 Unspecified osteoarthritis, unspecified site: Secondary | ICD-10-CM | POA: Diagnosis not present

## 2022-06-26 DIAGNOSIS — M0579 Rheumatoid arthritis with rheumatoid factor of multiple sites without organ or systems involvement: Secondary | ICD-10-CM | POA: Diagnosis not present

## 2022-07-31 DIAGNOSIS — M0579 Rheumatoid arthritis with rheumatoid factor of multiple sites without organ or systems involvement: Secondary | ICD-10-CM | POA: Diagnosis not present

## 2022-07-31 DIAGNOSIS — M8580 Other specified disorders of bone density and structure, unspecified site: Secondary | ICD-10-CM | POA: Diagnosis not present

## 2022-07-31 DIAGNOSIS — N179 Acute kidney failure, unspecified: Secondary | ICD-10-CM | POA: Diagnosis not present

## 2022-07-31 DIAGNOSIS — M25561 Pain in right knee: Secondary | ICD-10-CM | POA: Diagnosis not present

## 2022-07-31 DIAGNOSIS — M25439 Effusion, unspecified wrist: Secondary | ICD-10-CM | POA: Diagnosis not present

## 2022-07-31 DIAGNOSIS — M1990 Unspecified osteoarthritis, unspecified site: Secondary | ICD-10-CM | POA: Diagnosis not present

## 2022-07-31 DIAGNOSIS — L409 Psoriasis, unspecified: Secondary | ICD-10-CM | POA: Diagnosis not present

## 2022-07-31 DIAGNOSIS — M2550 Pain in unspecified joint: Secondary | ICD-10-CM | POA: Diagnosis not present

## 2022-07-31 DIAGNOSIS — L4050 Arthropathic psoriasis, unspecified: Secondary | ICD-10-CM | POA: Diagnosis not present

## 2022-07-31 DIAGNOSIS — Z79899 Other long term (current) drug therapy: Secondary | ICD-10-CM | POA: Diagnosis not present

## 2022-07-31 DIAGNOSIS — M79643 Pain in unspecified hand: Secondary | ICD-10-CM | POA: Diagnosis not present

## 2022-08-18 ENCOUNTER — Other Ambulatory Visit: Payer: Self-pay | Admitting: Medical

## 2022-08-20 ENCOUNTER — Encounter: Payer: Self-pay | Admitting: Family Medicine

## 2022-08-20 ENCOUNTER — Encounter: Payer: Self-pay | Admitting: Neurology

## 2022-08-20 ENCOUNTER — Other Ambulatory Visit: Payer: Self-pay | Admitting: Medical

## 2022-08-20 ENCOUNTER — Ambulatory Visit (INDEPENDENT_AMBULATORY_CARE_PROVIDER_SITE_OTHER): Payer: Medicare Other | Admitting: Family Medicine

## 2022-08-20 VITALS — BP 90/70 | HR 67 | Ht 68.0 in | Wt 159.2 lb

## 2022-08-20 DIAGNOSIS — R42 Dizziness and giddiness: Secondary | ICD-10-CM

## 2022-08-20 DIAGNOSIS — H814 Vertigo of central origin: Secondary | ICD-10-CM

## 2022-08-20 NOTE — Addendum Note (Signed)
Addended by: Ronnald Nian on: 08/20/2022 05:31 PM   Modules accepted: Orders

## 2022-08-20 NOTE — Progress Notes (Signed)
   Subjective:    Patient ID: Troy Chapman, male    DOB: 07/04/1955, 67 y.o.   MRN: [id number]  HPI He is here for evaluation of continued difficulty with dizziness.  He was seen in an emergency room on May 20 for evaluation of this and his blood pressure.  Blood work and CT scans were done.  He was given meclizine which helped but also caused sedation and he stopped this.  He also restarted his Lotrel which she had not been taking regularly.  He was seen again in the office by Vincenza Hews and given Transderm Scop.  He said that that caused visual disturbances and the therefore stopped that.  He did keep several of the patches around just in case of continued difficulty with dizziness.  He continues to have difficulty with the dizziness which is now also interfering with the quality of his life.  He describes it as intermittent in nature however when he does have the dizziness and tilts his head upward it gets worse.  He also occasionally has noted tunnel vision with that but no numbness, tingling or weakness.  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  He is followed by rheumatology for that.   Review of Systems     Objective:    Physical Exam EOMI.  Other cranial nerves grossly intact.  DTRs were 1+.  No carotid bruits noted.  Cardiac exam shows regular rhythm without murmurs or gallops. The medical record including ER visit was reviewed.      Assessment & Plan:  Dizziness - Plan: Ambulatory referral to Neurology Since he is having acceptable side effects from medications and the fact that the dizziness seems to be multifactorial with Tresa Garter but being positional vertigo but he also gets vertigo with normal head position, neurologic evaluation would be helpful.

## 2022-08-20 NOTE — Telephone Encounter (Signed)
You referred pt to [facility] today and they told him they can get him in sooner if he has an mri done first so he's asking if you can put the referral in for an mri?

## 2022-08-21 ENCOUNTER — Telehealth: Payer: Self-pay | Admitting: Medical

## 2022-08-21 MED ORDER — SCOPOLAMINE 1 MG/3DAYS TD PT72: 1.0000 | MEDICATED_PATCH | TRANSDERMAL | 0 refills | Status: DC

## 2022-08-21 NOTE — Telephone Encounter (Signed)
Caryn- can you get patient set up for MRI at [facility]  Dr. Susann Givens. Ok to refill the patchs

## 2022-08-21 NOTE — Telephone Encounter (Signed)
Pt called and he states he can get MRI on the 13 th at [facility]  [phone]  Neuro that we set him up with told him that if he could get MRI, they could probably see him sooner  Pt also needs refill on scopolamine almost out, [facility]

## 2022-08-21 NOTE — Telephone Encounter (Signed)
Pt requesting a refill on scopolamine to  [id number] - [place], [place] - [address]  If it cannot be filled he would like a call.

## 2022-08-22 ENCOUNTER — Ambulatory Visit: Payer: 59 | Admitting: Neurology

## 2022-08-22 ENCOUNTER — Telehealth: Payer: Self-pay

## 2022-08-22 ENCOUNTER — Other Ambulatory Visit (INDEPENDENT_AMBULATORY_CARE_PROVIDER_SITE_OTHER): Payer: 59

## 2022-08-22 ENCOUNTER — Encounter: Payer: Self-pay | Admitting: Neurology

## 2022-08-22 VITALS — BP 110/75 | HR 63 | Ht 68.0 in | Wt 161.4 lb

## 2022-08-22 DIAGNOSIS — R42 Dizziness and giddiness: Secondary | ICD-10-CM | POA: Diagnosis not present

## 2022-08-22 DIAGNOSIS — G629 Polyneuropathy, unspecified: Secondary | ICD-10-CM | POA: Diagnosis not present

## 2022-08-22 DIAGNOSIS — H814 Vertigo of central origin: Secondary | ICD-10-CM | POA: Diagnosis not present

## 2022-08-22 LAB — B12 AND FOLATE PANEL
Folate: 12.7 ng/mL (ref >5.9)
Vitamin B-12: 219 pg/mL (ref 211–911)

## 2022-08-22 LAB — TSH: TSH: 1.45 u[IU]/mL (ref 0.35–5.50)

## 2022-08-22 LAB — HEMOGLOBIN A1C: Hgb A1c MFr Bld: 5.8 % (ref 4.6–6.5)

## 2022-08-22 LAB — C-REACTIVE PROTEIN: CRP: <1 mg/dL (ref 0.5–20.0)

## 2022-08-22 LAB — SEDIMENTATION RATE: Sed Rate: 21 mm/hr — ABNORMAL HIGH (ref 0–20)

## 2022-08-22 NOTE — Patient Instructions (Addendum)
Good to meet you.  Proceed with scheduling brain MRI with and without contrast.  2. Also schedule MRA head without contrast  3. Have bloodwork done for HbA1c, TSH, B12, B1, ESR, CRP, copper, SPEP, IFE, folate, ANA, SS-A, SS-B  4. Increase water intake, try counterpressure maneuvers, ok to continue scopolamine patch  5. Follow-up in 2-3 months, call for any changes

## 2022-08-22 NOTE — Progress Notes (Signed)
Troy Chapman  Troy Chapman MRN: [id number] DOB: 03-10-1955  Referring provider: Dr. Sharlot Chapman Primary care provider: Crosby Oyster, PA-C  Reason for consult:  dizziness  Dear Dr Susann Givens:  Thank you for your kind referral of Troy Chapman for consultation of the above symptoms. Although his history is well known to you, please allow me to reiterate it for the purpose of our medical record. He is alone in the office today. Records and images were personally reviewed where available.   HISTORY OF PRESENT ILLNESS: This is a pleasant 67 year old right-handed man with a history of hypertension, hyperlipidemia, OSA (not on CPAP), presenting for evaluation of dizziness. He reports dizziness came on suddenly in May 2024, he was working in the heat, looked up, and got dizzy. Symptoms were constant, waxing and waning in intensity, and after a week he went to [facility] ER for evaluation. He reported dizziness worsening with changes of movement in head. He would wake up in the morning and feel okay, but the moment he sits up, the dizziness recurs, worse when he moves his head fast or looks up. Head CT normal. He was given meclizine which helped some but caused sedation. BP medications were adjusted by PCP, then he was started on the scopolamine patch which caused visual disturbances. He feels it seemed to help at first but after the vision changes occurred he stopped it, however restarted it last Thursday because it does suppress symptoms some. He denies any spinning sensation but there is a little bit of movement that is not much throughout the day. When he is lying down he feels dizzy, it is worse when he sits up he feels like he will pass out, then when standing he still feels dizzy and has to hold on to things. He fell one time when he was very dizzy. When the dizziness is worse, he sees blue and yellow lights that close in like tunnel vision. One time last week, the vision  changes were only on the left eye, but since then they have been in the periphery of both eyes.  He gets so dizzy he feels nauseated, food does not taste good. No difficulty swallowing or speaking. There is a little confusion, "I can't think straight." He has difficulty finding things, forgetting things, but "nothing major." He reports word-finding difficulties.   He has a BP monitor at home and recalls BP was 227/116 when dizziness started, but one time he was very dizzy and his BP was 80/60 and heart rate was elevated. Usually BP runs 120/80. He has chronic neck and back pain from arthritis. Since using the scopolamine patch, he has felt pain in his flanks. No bowel/bladder dysfunction. He denies any personal or family history of similar symptoms. He has smoked 1 PPD for the past 50+ years. He used to drink a bottle of wine every night and stopped a month or so ago, now drinking a couple of glasses before bedtime. He lives alone with his dog. He works as a Biochemist, clinical.     PAST MEDICAL HISTORY: Past Medical History:  Diagnosis Date   Anger    Anxiety    Arthritis    Humerus fracture    left   Hypertension    Osteoarthritis of hand    Sleep apnea 2016   no cpap, per pt   Smoker    Wears contact lenses     PAST SURGICAL HISTORY: Past Surgical History:  Procedure Laterality Date  COLONOSCOPY  2012   Dr. Elnoria Howard   KNEE ARTHROSCOPY     right x 3   ORIF HUMERUS FRACTURE Left 02/04/2020   Procedure: OPEN REDUCTION INTERNAL FIXATION (ORIF) HUMERAL SHAFT FRACTURE;  Surgeon: Sheral Apley, MD;  Location: [facility];  Service: Orthopedics;  Laterality: Left;  block   SHOULDER ARTHROSCOPY     left x 2, and RTC    MEDICATIONS: Current Outpatient Medications on File Prior to Visit  Medication Sig Dispense Refill   amLODipine-benazepril (LOTREL) 5-20 MG capsule Take 1 capsule by mouth daily. 30 capsule 2   Budeson-Glycopyrrol-Formoterol (BREZTRI AEROSPHERE) 160-9-4.8 MCG/ACT  AERO Inhale 2 puffs into the lungs 2 (two) times daily. 10.7 g 3   Fluticasone-Umeclidin-Vilant (TRELEGY ELLIPTA) 200-62.5-25 MCG/ACT AEPB Inhale 1 puff into the lungs daily. 28 each 1   meloxicam (MOBIC) 15 MG tablet Take 1 tablet by mouth once daily 90 tablet 0   rosuvastatin (CRESTOR) 10 MG tablet Take 1 tablet (10 mg total) by mouth daily. 90 tablet 0   scopolamine (TRANSDERM-SCOP) 1 MG/3DAYS Place 1 patch (1.5 mg total) onto the skin every 3 (three) days. 5 patch 0   No current facility-administered medications on file prior to visit.    ALLERGIES: Allergies  Allergen Reactions   Gabapentin     incontinence    FAMILY HISTORY: Family History  Problem Relation Age of Onset   Heart disease Mother        died of MI, diagnosed in 72s   Alzheimer's disease Father    Stroke Maternal Grandmother    Cancer Neg Hx     SOCIAL HISTORY: Social History   Socioeconomic History   Marital status: Married    Spouse name: Not on file   Number of children: Not on file   Years of education: Not on file   Highest education level: Not on file  Occupational History   Not on file  Tobacco Use   Smoking status: Every Day    Current packs/day: 1.00    Average packs/day: 1 pack/day for 50.0 years (50.0 ttl pk-yrs)    Types: Cigarettes   Smokeless tobacco: Never  Substance and Sexual Activity   Alcohol use: Yes    Alcohol/week: 10.0 standard drinks of alcohol    Types: 10 Glasses of wine per week    Comment: every day beers and then some wine   Drug use: No   Sexual activity: Not on file  Other Topics Concern   Not on file  Social History Narrative   Married, 2 children, owns Holiday representative business, active on the job.   Social Determinants of Health   Financial Resource Strain: Not on file  Food Insecurity: Not on file  Transportation Needs: Not on file  Physical Activity: Not on file  Stress: Not on file  Social Connections: Not on file  Intimate Partner Violence: Not on file      PHYSICAL EXAM: Vitals:   08/22/22 0926  BP: 110/75  Pulse: 63  SpO2: 95%    General: No acute distress Head:  Normocephalic/atraumatic Skin/Extremities: No rash, no edema, right 5th digit flexed at distal joint Neurological Exam: Mental status: alert and oriented to person, place, and time, no dysarthria or aphasia, Fund of knowledge is appropriate. Attention and concentration are normal. Cranial nerves: CN I: not tested CN II: pupils equal, round, visual fields intact CN III, IV, VI:  full range of motion, no nystagmus, no ptosis CN V: facial sensation intact CN VII:  upper and lower face symmetric CN VIII: hearing intact to conversation CN IX, X: gag intact, uvula midline CN XI: sternocleidomastoid and trapezius muscles intact CN XII: tongue midline Bulk & Tone: normal, no cogwheeling, no fasciculations. Motor: 5/5 throughout with no pronator drift. Sensation: decreased cold on dorsum of right hand, decreased pin to right ankle, decreased cold to right knee, decreased vibration sense to ankles bilaterally.  Romberg test slight sway Deep Tendon Reflexes: +2 on right patella and ankle, otherwise unable to elicit reflexes Cerebellar: no incoordination on finger to nose testing, rapid alternating movements Gait: slow and cautious reporting arthritis pain, no ataxia, unable to tandem walk Tremor: none No postural instability   IMPRESSION: This is a pleasant 67 year old right-handed man with a history of hypertension, hyperlipidemia, OSA (not on CPAP), presenting for evaluation of dizziness, etiology unclear. His neurological exam does not show any significant cerebellar signs, however with description of constant dizziness, would rule out structural abnormality. He is awaiting scheduling for brain MRI. We also discussed doing MRA head without contrast to assess for vertebrobasilar insufficiency. He denies any spinning sensation, and appears to describe more a sensation of  passing out with positional changes. No orthostasis today, exam shows some neuropathy which can cause imbalance but would not fully explain his symptoms. Neuropathy labs will be ordered. He was advised to increase water intake, try counterpressure maneuvers. He feels the scopolamine patch helps some, okay to continue. Follow-up in 2-3 months, call for any changes.   Thank you for allowing me to participate in the care of this patient. Please do not hesitate to call for any questions or concerns.   Patrcia Dolly, M.D.  CC: Troy Oyster, PA-C, Dr. Susann Givens

## 2022-08-22 NOTE — Telephone Encounter (Signed)
Pt left voicemail regarding scoplamine patches. Pt was notified that rx was sent and confirmed on 08/21/22. Pt also wanted to let know he had MRI on 08/21/22 so does not need referral.

## 2022-08-29 ENCOUNTER — Telehealth: Payer: Self-pay | Admitting: Neurology

## 2022-08-29 ENCOUNTER — Other Ambulatory Visit: Payer: Self-pay | Admitting: Neurology

## 2022-08-29 MED ORDER — DIAZEPAM 5 MG PO TABS: ORAL_TABLET | ORAL | 5 refills | Status: DC

## 2022-08-29 NOTE — Telephone Encounter (Signed)
Pt stated that he had MRA done that we ordered on 8/8  he would like to try the valium. He said he is worried about driving to go to therapy at this time maybe if the dizziness get better.

## 2022-08-29 NOTE — Telephone Encounter (Signed)
Pt is calling in wanting to see if he can get his MRI results that he state that he had done on 08/22/2022 at [facility].  Pt also stated that he is still having really bad vertigo and is unable to walk, drive and really can not concentrate.  Pt would like to have a call back.

## 2022-08-29 NOTE — Telephone Encounter (Signed)
Mri results are not yet available, using Scolopamine patches, not helping last used 3 days ago, today he is having nauseated, says dizziness is worse. Please advise and route to pool thank you. Unable to use antivert, states it knocks him out.

## 2022-08-29 NOTE — Telephone Encounter (Signed)
Pls call [facility] for results, I did not order it so it would not come up on our side I think. Also, pls let him know that we can send referral for Vestibular therapy to hopefully help with the dizziness, but otherwise the other option medication-wise can also cause dizziness, it would be Valium 0.5mg  tablet. Would start 1 tablet every night at first, if he would like to try. Thanks

## 2022-08-29 NOTE — Telephone Encounter (Signed)
Dr Karel Jarvis sent in Valium 5mg  (sorry it is 5 and not 0.5), take at night. If it makes him too drowsy, he can try 1/2 tablet at bedtime

## 2022-08-29 NOTE — Telephone Encounter (Signed)
Pls let him know I sent in Valium 5mg  (sorry it is 5 and not 0.5), take at night. If it makes him too drowsy, he can try 1/2 tablet at bedtime.

## 2022-08-30 ENCOUNTER — Telehealth: Payer: Self-pay | Admitting: Neurology

## 2022-08-30 NOTE — Telephone Encounter (Signed)
Pt called an advised that its going to take more that one night for it to get into his system he needs to give it a good 2 weeks before he will know if it will work or not, pt verbalized understanding,

## 2022-08-30 NOTE — Telephone Encounter (Signed)
Caller stated that medication that was prescribed to him yesterday [facility]) is doing nothing for him and wants to know if can be prescribed something different before we close today.

## 2022-09-02 ENCOUNTER — Other Ambulatory Visit: Payer: Self-pay | Admitting: Medical

## 2022-09-03 ENCOUNTER — Telehealth: Payer: Self-pay | Admitting: Neurology

## 2022-09-03 NOTE — Telephone Encounter (Signed)
Patient called stating that he is really needing the MRI results so he can go back to work. [facility] told Patient that Dr. Karel Jarvis could call them and get the results faster

## 2022-09-04 ENCOUNTER — Ambulatory Visit (HOSPITAL_BASED_OUTPATIENT_CLINIC_OR_DEPARTMENT_OTHER): Payer: 59

## 2022-09-04 ENCOUNTER — Telehealth: Payer: Self-pay | Admitting: Neurology

## 2022-09-04 NOTE — Telephone Encounter (Signed)
[facility] called they added [facility] [place] [facility] as they location as for his MRA his case number is [id number] phone number [phone] I will call tomorrow to see if med center has need approved an if his MRA has been approved

## 2022-09-04 NOTE — Telephone Encounter (Signed)
Pt is scheduled for his MRA today at 1:30 for his test at 2pm. Pt called an informed he stated that he would be there ,

## 2022-09-04 NOTE — Telephone Encounter (Signed)
Caller states he needs to speak with nurse. Pt had MRI today and imaging made him reschedule to this Sunday 09/08/22. Stated that the imaging place told him it was because something Neurology did wrong.

## 2022-09-05 ENCOUNTER — Telehealth: Payer: Self-pay

## 2022-09-05 NOTE — Telephone Encounter (Signed)
[facility] approval number [id number] for MRA

## 2022-09-08 ENCOUNTER — Ambulatory Visit (HOSPITAL_BASED_OUTPATIENT_CLINIC_OR_DEPARTMENT_OTHER)
Admission: RE | Admit: 2022-09-08 | Discharge: 2022-09-08 | Disposition: A | Discharge status: Home / Self Care | Payer: Medicare Other | Source: Ambulatory Visit | Attending: Neurology | Admitting: Neurology

## 2022-09-08 DIAGNOSIS — R42 Dizziness and giddiness: Secondary | ICD-10-CM | POA: Diagnosis not present

## 2022-09-20 ENCOUNTER — Telehealth: Payer: Self-pay | Admitting: Neurology

## 2022-09-20 DIAGNOSIS — R42 Dizziness and giddiness: Secondary | ICD-10-CM

## 2022-09-20 NOTE — Telephone Encounter (Signed)
Pt called an informed that  the brain MRI as well as the MRA are normal, there is no stroke, tumor, or bleed, no clot or narrowing in the blood vessels. I understand his frustration but I am not seeing a neurological cause for his dizziness. Pls check if he has seen ENT? It may be an inner ear issue. Pt does not have an ENT he would like to have a referral.

## 2022-09-20 NOTE — Telephone Encounter (Signed)
Patient called wanting to get the MRA results

## 2022-09-20 NOTE — Telephone Encounter (Signed)
Caller stated he would like to know results from MRA

## 2022-09-20 NOTE — Addendum Note (Signed)
Addended by: Dimas Chyle on: 09/20/2022 04:25 PM   Modules accepted: Orders

## 2022-09-20 NOTE — Telephone Encounter (Signed)
Pls let him know that the brain MRI as well as the MRA are normal, there is no stroke, tumor, or bleed, no clot or narrowing in the blood vessels. I understand his frustration but I am not seeing a neurological cause for his dizziness. Pls check if he has seen ENT? It may be an inner ear issue

## 2022-09-30 ENCOUNTER — Telehealth: Payer: Self-pay

## 2022-09-30 NOTE — Telephone Encounter (Signed)
-----   Message from Van Clines sent at 09/23/2022  8:34 AM EDT ----- Pls let him know bloodwork came back with his B1 level very low. This would not in itself cause dizziness, but can affect the nerves. Recommend starting a daily B1 (thiamine) 100mg  tablet. Thanks

## 2022-09-30 NOTE — Telephone Encounter (Signed)
Pt called an informed that bloodwork came back with his B1 level very low. This would not in itself cause dizziness, but can affect the nerves. Recommend starting a daily B1 (thiamine) 100mg  tablet.

## 2022-10-04 ENCOUNTER — Ambulatory Visit: Payer: 59 | Admitting: Neurology

## 2022-10-08 ENCOUNTER — Other Ambulatory Visit: Payer: Self-pay | Admitting: Medical

## 2022-10-08 NOTE — Telephone Encounter (Signed)
Is this okay to refill>? 

## 2022-10-22 ENCOUNTER — Telehealth: Payer: Self-pay | Admitting: Neurology

## 2022-10-22 NOTE — Telephone Encounter (Signed)
Pt called given the number to Dr Suszanne Conners office so he can called to get scheduled.

## 2022-10-22 NOTE — Telephone Encounter (Signed)
Patient called stating he did not see any point in coming back because he understood that doctor Karel Jarvis told him , he needed to see a ENT. Patient is asking about the referral

## 2022-10-23 ENCOUNTER — Ambulatory Visit: Payer: 59 | Admitting: Neurology

## 2022-11-04 ENCOUNTER — Ambulatory Visit (INDEPENDENT_AMBULATORY_CARE_PROVIDER_SITE_OTHER): Payer: Medicare Other

## 2022-11-05 ENCOUNTER — Encounter (INDEPENDENT_AMBULATORY_CARE_PROVIDER_SITE_OTHER): Payer: Self-pay

## 2022-11-05 ENCOUNTER — Ambulatory Visit (INDEPENDENT_AMBULATORY_CARE_PROVIDER_SITE_OTHER): Payer: Medicare Other | Admitting: Otolaryngology

## 2022-11-05 ENCOUNTER — Ambulatory Visit (INDEPENDENT_AMBULATORY_CARE_PROVIDER_SITE_OTHER): Payer: Medicare Other | Admitting: Audiology

## 2022-11-05 VITALS — Ht 68.0 in | Wt 165.0 lb

## 2022-11-05 DIAGNOSIS — H903 Sensorineural hearing loss, bilateral: Secondary | ICD-10-CM

## 2022-11-05 DIAGNOSIS — R42 Dizziness and giddiness: Secondary | ICD-10-CM | POA: Diagnosis not present

## 2022-11-05 NOTE — Progress Notes (Signed)
  [address], [address] [place], [place] [address] [phone]  Audiological Evaluation    Name: Troy Chapman     DOB:   01-Jan-1956      MRN:   [id number]                                                                                     Service Date: 11/05/2022        Patient was referred today for a hearing evaluation by Dr. Karle Barr.   Symptoms Yes Details  Hearing loss  []  Patient denied perceiving any hearing loss.  Tinnitus  [x]  Patient reported experiencing tinnitus in both ears.  Previous ear surgeries  []  Patient denied any previous ear surgeries.  Amplification  []  Patient denied the use of hearing aids.    Tympanogram: Right ear: Normal external ear canal volume with normal middle ear pressure and tympanic membrane compliance (Type A). Left ear: Normal external ear canal volume with normal middle ear pressure and tympanic membrane compliance (Type A).    Hearing Evaluation: The audiogram was completed using conventional audiometric techniques under headphones with good reliability.   The hearing test results indicate: Right ear: Normal hearing sensitivity from 365 751 2312 Hz sloping to moderately-severe sensorineural hearing loss from 3000-8000 Hz. Left ear: Normal hearing sensitivity from 365 751 2312 Hz sloping to moderate sensorineural hearing loss from 3000-8000 Hz.  Speech Audiometry: Right ear- Speech Reception Threshold (SRT) was obtained at 20 dBHL. Left ear- Speech Reception Threshold (SRT) was obtained at 20 dBHL.   Word Recognition Score Tested using NU-6 (MLV) Right ear: 88% was obtained at a presentation level of 60 dBHL which is deemed as good understanding. Left ear: 92% was obtained at a presentation level of 60 dBHL which is deemed as good understanding.    Impression: There is not a significant difference in puretone thresholds and word recognition scores between the ears.   Recommendations: Repeat audiogram when changes are  perceived or per MD. Patient is a candidate for hearing amplification, consider a hearing aid evaluation. Consider various tinnitus strategies, including the use of a noise generator, hearing aids, or tinnitus retraining therapy.    Conley Rolls Taquilla Downum, AUD, CCC-A 11/05/22

## 2022-11-06 ENCOUNTER — Telehealth: Payer: Self-pay | Admitting: Medical

## 2022-11-06 NOTE — Telephone Encounter (Signed)
Pt called states still having issues with vertigo, nothing seems to help, said been to Neurology & they did MRI/MRA & was told everything normal, couldn't figure out why he is having vertigo & states they pretty much couldn't do anything else for him & they sent him to ENT & ENT didn't see any reason for the vertigo either.  His friend recommended Rx for Sudafed,  I advised is OTC behind the counter, he is going to try that & call back first of week if no better to see what Vincenza Hews recommends next.

## 2022-11-07 DIAGNOSIS — R42 Dizziness and giddiness: Secondary | ICD-10-CM

## 2022-11-07 DIAGNOSIS — H903 Sensorineural hearing loss, bilateral: Secondary | ICD-10-CM

## 2022-11-07 HISTORY — DX: Sensorineural hearing loss, bilateral: H90.3

## 2022-11-07 HISTORY — DX: Dizziness and giddiness: R42

## 2022-11-07 NOTE — Progress Notes (Signed)
Patient ID: Troy Chapman, male   DOB: [birth year], 67 y.o.   MRN: [id number]  Cc: Recurrent dizziness  HPI:  Troy Chapman is an 67 y.o. male who presents today complaining of recurrent dizziness since July 2024.  His initial dizziness in July was described as a blackout sensation, accompanied by visual aura.  He was having significant lightheaded and off-balance sensation.  He also had brief spinning vertigo intermittently.  His symptoms lasted for 2 weeks.  He was treated with meclizine and scopolamine patch.  He denies any significant otalgia, otorrhea, tinnitus, hearing loss, or headaches.  He was evaluated by a neurologist.  His MRI and MRA were negative.  Over the past month, he continues to be symptomatic 2 to 3 days a week.  He has noted occasional nausea and poor appetite.  He is currently on Valium nightly.  He has a history of chronic arthritis.  He has no previous ENT surgery.  Past Medical History:  Diagnosis Date   Anger    Anxiety    Arthritis    Humerus fracture    left   Hypertension    Osteoarthritis of hand    Sleep apnea 2016   no cpap, per pt   Smoker    Wears contact lenses     Past Surgical History:  Procedure Laterality Date   COLONOSCOPY  2012   Dr. Elnoria Howard   KNEE ARTHROSCOPY     right x 3   ORIF HUMERUS FRACTURE Left 02/04/2020   Procedure: OPEN REDUCTION INTERNAL FIXATION (ORIF) HUMERAL SHAFT FRACTURE;  Surgeon: Sheral Apley, MD;  Location: [facility];  Service: Orthopedics;  Laterality: Left;  block   SHOULDER ARTHROSCOPY     left x 2, and RTC    Family History  Problem Relation Age of Onset   Heart disease Mother        died of MI, diagnosed in 33s   Dementia Father    Alzheimer's disease Father    Stroke Maternal Grandmother    Cancer Neg Hx     Social History:  reports that he has been smoking cigarettes. He has a 50 pack-year smoking history. He has never used smokeless tobacco. He reports current alcohol use of about  10.0 standard drinks of alcohol per week. He reports that he does not use drugs.  Allergies:  Allergies  Allergen Reactions   Gabapentin     incontinence    Prior to Admission medications   Medication Sig Start Date End Date Taking? Authorizing Provider  amLODipine-benazepril (LOTREL) 5-20 MG capsule Take 1 capsule by mouth once daily 09/02/22  Yes Tysinger, Kermit Balo, PA-C  Budeson-Glycopyrrol-Formoterol (BREZTRI AEROSPHERE) 160-9-4.8 MCG/ACT AERO Inhale 2 puffs into the lungs 2 (two) times daily. 06/04/22  Yes Tysinger, Kermit Balo, PA-C  diazepam (VALIUM) 5 MG tablet Take 1 tablet every night 08/29/22  Yes Van Clines, MD  Fluticasone-Umeclidin-Vilant (TRELEGY ELLIPTA) 200-62.5-25 MCG/ACT AEPB Inhale 1 puff into the lungs daily. 06/11/22  Yes Tysinger, Kermit Balo, PA-C  meloxicam (MOBIC) 15 MG tablet Take 1 tablet by mouth once daily 10/08/22  Yes Tysinger, Kermit Balo, PA-C  rosuvastatin (CRESTOR) 10 MG tablet Take 1 tablet (10 mg total) by mouth daily. 06/04/22 06/04/23 Yes Tysinger, Kermit Balo, PA-C  scopolamine (TRANSDERM-SCOP) 1 MG/3DAYS Place 1 patch (1.5 mg total) onto the skin every 3 (three) days. 08/21/22  Yes Ronnald Nian, MD   Height 5\' 8"  (1.727 m), weight 74.8 kg. Exam: General: Communicates without  difficulty, well nourished, no acute distress. Head: Normocephalic, no evidence injury, no tenderness, facial buttresses intact without stepoff. Face/sinus: No tenderness to palpation and percussion. Facial movement is normal and symmetric. Eyes: PERRL, EOMI. No scleral icterus, conjunctivae clear. Neuro: CN II exam reveals vision grossly intact.  No nystagmus at any point of gaze. Ears: Auricles well formed without lesions.  Ear canals are intact without mass or lesion.  No erythema or edema is appreciated.  The TMs are intact without fluid. Nose: External evaluation reveals normal support and skin without lesions.  Dorsum is intact.  Anterior rhinoscopy reveals congested mucosa over anterior aspect  of inferior turbinates and intact septum.  No purulence noted. Oral:  Oral cavity and oropharynx are intact, symmetric, without erythema or edema.  Mucosa is moist without lesions. Neck: Full range of motion without pain.  There is no significant lymphadenopathy.  No masses palpable.  Thyroid bed within normal limits to palpation.  Parotid glands and submandibular glands equal bilaterally without mass.  Trachea is midline. Neuro:  CN 2-12 grossly intact.  Vestibular: No nystagmus at any point of gaze. Dix Hallpike negative.Vestibular: There is no nystagmus with pneumatic pressure on either tympanic membrane or Valsalva. The cerebellar examination is unremarkable.    The hearing test shows bilateral symmetric high-frequency sensorineural hearing loss.  Assessment:  Recurrent dizziness of unknown etiology. The possible differential diagnoses include transient BPPV, vestibular migraine, Meniere's disease, peripheral vestibular dysfunction, or other central/systemic causes.   [name] ear canals, tympanic membranes, and middle ear spaces are normal.  His Dix- Hallpike maneuver is negative. Bilateral symmetric high-frequency sensorineural hearing loss.  Plan: 1.  The physical exam findings and the hearing test results are reviewed with the patient. 2.  The pathophysiology of vestibular dysfunction and dizziness are discussed extensively with the patient. The possible differential diagnoses are reviewed. Questions are invited and answered.   3.  The patient will benefit from undergoing vestibular neurodiagnostic testing to evaluate for possible vestibular dysfunction. 4.  The patient will return for reevaluation after his vestibular testing.   Judas Mohammad W Jakeel Starliper 11/07/2022, 12:12 PM

## 2022-11-11 ENCOUNTER — Other Ambulatory Visit: Payer: Self-pay | Admitting: Otolaryngology

## 2022-11-11 MED ORDER — MECLIZINE HCL 25 MG PO TABS: 25.0000 mg | ORAL_TABLET | Freq: Two times a day (BID) | ORAL | 3 refills | Status: DC | PRN

## 2022-11-11 MED ORDER — MECLIZINE HCL 25 MG PO TABS: 25.0000 mg | ORAL_TABLET | Freq: Two times a day (BID) | ORAL | 3 refills | Status: AC | PRN

## 2022-11-20 ENCOUNTER — Encounter: Payer: Self-pay | Admitting: Audiology

## 2022-11-25 ENCOUNTER — Telehealth: Payer: Self-pay | Admitting: Medical

## 2022-11-25 NOTE — Telephone Encounter (Signed)
Called to see if pt needed since just filled on 11/04/22.

## 2022-11-25 NOTE — Telephone Encounter (Signed)
Refill request  Amlodi/benazepril 5-20 #30  Last filled 11/04/22

## 2022-11-27 ENCOUNTER — Other Ambulatory Visit: Payer: Self-pay | Admitting: Medical

## 2022-11-27 MED ORDER — BENAZEPRIL HCL 20 MG PO TABS: 20.0000 mg | ORAL_TABLET | Freq: Every day | ORAL | 0 refills | Status: DC

## 2022-11-27 NOTE — Telephone Encounter (Signed)
Pt states he took himself off of the lotrel due to vertigo. States his vertigo has improved drastically since this change. Would like to recommended something else for his hypertension.

## 2022-11-28 NOTE — Telephone Encounter (Signed)
Pt notified and scheduled.

## 2022-11-30 NOTE — Telephone Encounter (Signed)
done

## 2022-12-03 ENCOUNTER — Telehealth: Payer: Self-pay | Admitting: Medical

## 2022-12-03 DIAGNOSIS — I10 Essential (primary) hypertension: Secondary | ICD-10-CM

## 2022-12-03 DIAGNOSIS — I160 Hypertensive urgency: Secondary | ICD-10-CM

## 2022-12-03 NOTE — Telephone Encounter (Signed)
New bp meds causing vertigo also   Please call

## 2022-12-04 ENCOUNTER — Other Ambulatory Visit: Payer: Self-pay | Admitting: Medical

## 2022-12-04 MED ORDER — AMLODIPINE-OLMESARTAN 5-20 MG PO TABS: 1.0000 | ORAL_TABLET | Freq: Every day | ORAL | 0 refills | Status: DC

## 2022-12-04 NOTE — Telephone Encounter (Signed)
Called patient about new bp meds. He had received notification from pharmacy. He states Losartan is the bp med that he had so many issues with before with the vertigo

## 2022-12-05 NOTE — Telephone Encounter (Signed)
Pt was notified and agreeable to cardiology referral I have placed an order for this.

## 2022-12-10 ENCOUNTER — Ambulatory Visit (INDEPENDENT_AMBULATORY_CARE_PROVIDER_SITE_OTHER): Payer: Medicare Other | Admitting: Audiology

## 2022-12-10 DIAGNOSIS — R42 Dizziness and giddiness: Secondary | ICD-10-CM | POA: Diagnosis not present

## 2022-12-10 NOTE — Progress Notes (Unsigned)
[address] [place], [place] [address] [phone]  Vestibular Evaluation    Name: Troy Chapman     DOB:   November 06, 1955      MRN:   [id number]                                                                                     Service Date: 12/10/2022        Case History:  Patient is a 67 year old male who was seen for a vestibular assessment as ordered by Dr. Janeece Riggers Philomena Doheny. Patient reported a sensation of dizziness that started approximately in July of 2024. He described his dizziness as a spinning sensation, imbalance, near-faint sensation, lightheadedness, drunkenness, and sea legs.   Associated symptoms include bilateral tinnitus, headaches with light sensitivity, tingling/numbness, changes in vision, nausea, and falls. His intermittent episodes reportedly occur daily. Exacerbating factors include lying down to the right, looking up, and walking. Patient noted that the room seems to spin when he rolls over in bed to the right.   He reported taking Diazepam within 48 hours of testing, which is on the [facility] medication discontinuation list. He reported discontinuing Amlodipine 3 weeks ago, and noticed an improvement in his dizziness symptoms.  Additional medical history pertinent to the vestibular assessment includes right normal hearing sensitivity from [phone] Hz sloping to moderately-severe sensorineural hearing loss from 3000-8000 Hz with good word understanding, left normal hearing sensitivity from [phone] Hz sloping to moderate sensorineural hearing loss from 3000-8000 Hz with good word understanding, bilateral normal middle ear function, hypertension, arthritis, and depression.    Biomedical scientist (SOP) Test: Romberg (Conditions 1-2): WNL  Tandem Romberg (Conditions 3-4): WNL  CTSIB (Conditions 5-6): Sway  Fukuda (Condition 7): WNL   Vertebral Artery Screening Test (VAST): VAST Right: Negative VAST Left:  Negative  Electrophysiology: Auditory Brainstem Response (ABR): WNL AU  Electrocochleography (ECochG): Inconclusive AD; Negative AS  Cervical Vestibular-Evoked Myogenic Potential (cVEMP): Reduced AS; Present AD  Videonystagmography (VNG): Ocular Motility Tests Gaze: WNL  Smooth Pursuit: WNL  Saccades: WNL  OPK: WNL   Spontaneous Nystagmus Sitting: None Supine: None Head Shake (Active): None  Positional Tests Head Right: WNL Head Left: WNL Body Right: DNT Body Left: DNT   Positioning Tests Head Hanging Right: Clinically significant 7 deg/s right and up-beating nystagmus that fixated using a visual target Head Hanging Left: WNL Dix-Hallpike/Side Lying Right: Positive for BPPV Dix-Hallpike/Side Lying Left: Negative for BPPV  Bithermal Air Calorics Unilateral Weakness: 4% (<25% WNL), Right Directional Preponderance: 7% (<30% WNL), Right Fixation Index: Normal  Rotary Chair:  Sinusoidal Harmonic Acceleration (SHA)  Frequencies Tested: 0.01, 0.02, 0.04, 0.08, 0.16, 0.32, 0.64 Hz  Gain: Low gain at 0.01 Hz; WNL for all other tested frequencies  Phase: Phase lead from 0.02-0.64 Hz; WNL for all other tested frequencies  Symmetry: WNL   Step Velocity Test (SVT)  Gain: WNL  Time Constant: WNL  Time Constant Asymmetry: WNL     Conclusions:  Abnormal vestibular assessment with central and peripheral indicators. Positive Right Dix-Hallpike.  Abnormal [facility] Test results suggest an imbalance between visual, somatosensory, and vestibular inputs for dynamic postural control. Vertebral  Artery Screening Test was negative, bilaterally, for vertebral artery insufficiency. Normal ABR results suggest good function of the cochlear nerve in both ears. Inconclusive ECochG results in the right ear may be attributed to poor waveform morphology and replicability. Elevated labyrinthine pressure, or endolymphatic hydrops, cannot be ruled out in the right ear at this time. Normal ECochG  results in the left ear do not suggest elevated labyrinthine pressure, or endolymphatic hydrops, in the left ear. Abnormal cVEMP results in the left ear may be attributed to poor function of the saccule and/or inferior vestibular nerve in the left ear and/or a normal age-related variant associated with low muscle tone at the recording site. Normal cVEMP results in the right ear suggest intact function of the saccule and inferior vestibular nerve in the right ear. Normal ocular motility test results. No clinically significant nystagmus observed during positional tests. Clinically significant torsional nystagmus was noted in the right Dix-Hallpike position. Normal head shake test results do not suggest an imbalance of vestibular inputs. Normal bithermal air calorics results do not suggest a unilateral weakness or bilateral weakness affecting the lateral semicircular canal and/or superior vestibular nerve in either ear. Abnormal phase lead for [facility] suggests mixed involvement. Normal SVT results suggest an intact velocity storage mechanism.   Recommendations: 1. Follow-up appointment with Dr. Janeece Riggers Philomena Doheny at [facility] for further discussion and recommendations. 2. Repeat testing as medically necessary or should further concerns arise.   Audiologist: Claudette Laws, AuD, CCC-A Date of Service: 12/10/2022

## 2022-12-19 ENCOUNTER — Encounter (INDEPENDENT_AMBULATORY_CARE_PROVIDER_SITE_OTHER): Payer: Self-pay

## 2022-12-19 ENCOUNTER — Ambulatory Visit (INDEPENDENT_AMBULATORY_CARE_PROVIDER_SITE_OTHER): Payer: Medicare Other | Admitting: Otolaryngology

## 2022-12-19 VITALS — Ht 68.0 in | Wt 165.0 lb

## 2022-12-19 DIAGNOSIS — R42 Dizziness and giddiness: Secondary | ICD-10-CM

## 2022-12-19 DIAGNOSIS — H8111 Benign paroxysmal vertigo, right ear: Secondary | ICD-10-CM | POA: Diagnosis not present

## 2022-12-19 DIAGNOSIS — H903 Sensorineural hearing loss, bilateral: Secondary | ICD-10-CM | POA: Diagnosis not present

## 2022-12-21 DIAGNOSIS — H8111 Benign paroxysmal vertigo, right ear: Secondary | ICD-10-CM | POA: Insufficient documentation

## 2022-12-21 HISTORY — DX: Benign paroxysmal vertigo, right ear: H81.11

## 2022-12-21 NOTE — Progress Notes (Signed)
Patient ID: Troy Chapman, male   DOB: February 25, 1955, 67 y.o.   MRN: [id number]  Follow-up: Recurrent dizziness  HPI: The patient is a 67 year old male who returns today for his follow-up evaluation.  He was previously seen for recurrent dizziness.  He has been symptomatic since July 2024.  He describes his dizziness as a lightheaded and off-balance sensation.  He also has intermittent brief spinning vertigo.  His MRI and MRA were negative.  At his last visit, he was noted to have bilateral symmetric high-frequency sensorineural hearing loss.  He subsequently underwent vestibular neurodiagnostic testing.  He was findings consistent with both central and peripheral vestibular dysfunction.  In addition, his right Dix-Hallpike maneuver was positive during the vestibular testing.  The patient returns today complaining of persistent difficulty with his balance.  He denies any otalgia, otorrhea, or change in his hearing.  Exam: General: Communicates without difficulty, well nourished, no acute distress. Head: Normocephalic, no evidence injury, no tenderness, facial buttresses intact without stepoff. Face/sinus: No tenderness to palpation and percussion. Facial movement is normal and symmetric. Eyes: PERRL, EOMI. No scleral icterus, conjunctivae clear. Neuro: CN II exam reveals vision grossly intact.  No nystagmus at any point of gaze. Ears: Auricles well formed without lesions.  Ear canals are intact without mass or lesion.  No erythema or edema is appreciated.  The TMs are intact without fluid. Nose: External evaluation reveals normal support and skin without lesions.  Dorsum is intact.  Anterior rhinoscopy reveals congested mucosa over anterior aspect of inferior turbinates and intact septum.  No purulence noted. Oral:  Oral cavity and oropharynx are intact, symmetric, without erythema or edema.  Mucosa is moist without lesions. Neck: Full range of motion without pain.  There is no significant lymphadenopathy.   No masses palpable.  Thyroid bed within normal limits to palpation.  Parotid glands and submandibular glands equal bilaterally without mass.  Trachea is midline. Neuro:  CN 2-12 grossly intact. Vestibular: No nystagmus at any point of gaze. Vestibular: Dix-Hallpike produces rotational nystagmus with right-sided positioning. Vestibular: There is no nystagmus with pneumatic pressure on either tympanic membrane or Valsalva. The cerebellar examination is unremarkable.    Procedure: Right-sided Epley maneuver  Anesthesia: None Indication: To treat the right BPPV Desciption:  The patient is first placed in a right-sided Dix-Hallpike position. After the vertigo has subsided, the head is gradually rotated from the right to the left, completing a 180 turn. The patient is then returned to the upright position. The patient tolerated the procedure well without any difficulty.    Assessment: 1.  Recurrent dizziness, secondary to both central and peripheral vestibular dysfunction.  In addition, his right Dix-Hallpike is positive today, consistent with right benign paroxysmal positional vertigo. 2.  His ear canals, tympanic membranes, and middle ear spaces are normal. 3.  Subjectively stable bilateral high-frequency sensorineural hearing loss.  Plan: 1.  The physical exam findings and the vestibular neurodiagnostic testing results are extensively discussed with the patient. 2.  The pathophysiology of vestibular dysfunction and dizziness are discussed extensively with the patient.  Questions are invited and answered.   3.  The right Epley maneuver is performed today without difficulty. 4.  Post Epley activity restrictions are discussed. 5.  The patient will benefit from undergoing vestibular rehabilitation with a physical therapist to improve his balancing function. 6.  The patient will return for reevaluation in 4 weeks.

## 2022-12-24 ENCOUNTER — Ambulatory Visit: Payer: Medicare Other | Admitting: Physical Therapy

## 2022-12-24 ENCOUNTER — Encounter (INDEPENDENT_AMBULATORY_CARE_PROVIDER_SITE_OTHER): Payer: Medicare Other | Admitting: Audiology

## 2022-12-26 ENCOUNTER — Encounter: Payer: Self-pay | Admitting: Physical Therapy

## 2022-12-26 ENCOUNTER — Ambulatory Visit: Payer: Medicare Other | Admitting: Physical Therapy

## 2022-12-26 ENCOUNTER — Other Ambulatory Visit: Payer: Medicare Other

## 2022-12-26 ENCOUNTER — Telehealth: Payer: Self-pay

## 2022-12-26 ENCOUNTER — Other Ambulatory Visit: Payer: Self-pay

## 2022-12-26 ENCOUNTER — Telehealth: Payer: Self-pay | Admitting: Physical Therapy

## 2022-12-26 VITALS — BP 190/114 | HR 62

## 2022-12-26 DIAGNOSIS — R42 Dizziness and giddiness: Secondary | ICD-10-CM

## 2022-12-26 NOTE — Therapy (Deleted)
OUTPATIENT PHYSICAL THERAPY VESTIBULAR EVALUATION     Patient Name: Troy Chapman MRN: [id number] DOB:[birth year], 67 y.o., male Today's Date: 12/26/2022  END OF SESSION:  PT End of Session - 12/26/22 0847     Visit Number 1    Authorization Type Medicare    PT Start Time 0848             Past Medical History:  Diagnosis Date   Anger    Anxiety    Arthritis    Humerus fracture    left   Hypertension    Osteoarthritis of hand    Sleep apnea 2016   no cpap, per pt   Smoker    Wears contact lenses    Past Surgical History:  Procedure Laterality Date   COLONOSCOPY  2012   Dr. Elnoria Howard   KNEE ARTHROSCOPY     right x 3   ORIF HUMERUS FRACTURE Left 02/04/2020   Procedure: OPEN REDUCTION INTERNAL FIXATION (ORIF) HUMERAL SHAFT FRACTURE;  Surgeon: Sheral Apley, MD;  Location: [facility];  Service: Orthopedics;  Laterality: Left;  block   SHOULDER ARTHROSCOPY     left x 2, and RTC   Patient Active Problem List   Diagnosis Date Noted   Benign paroxysmal vertigo of right ear 12/21/2022   Dizziness 11/07/2022   Sensorineural hearing loss, bilateral 11/07/2022   Aortic atherosclerosis (HCC) 10/29/2021   Coronary artery disease involving native coronary artery of native heart without angina pectoris 10/29/2021   Hyperlipidemia 10/29/2021   Pulmonary nodule 10/29/2021   Screening for heart disease 08/15/2021   Screening for lipid disorders 08/15/2021   Screening for prostate cancer 08/15/2021   Chronic diarrhea 08/15/2021   Chronic abdominal pain 08/15/2021   Abnormal lung sounds 08/15/2021   Cyclic citrullinated peptide (CCP) antibody positive 12/26/2020   Diarrhea 11/06/2020   Epigastric pain 11/06/2020   High risk medication use 11/06/2020   Weight loss 11/06/2020   Abnormal lung field 11/06/2020   Psoriasis 09/08/2019   Open fracture of tooth 02/05/2019   Fatigue 09/19/2017   Contracture of joint of both hands 09/19/2017   Low testosterone  09/19/2017   Primary osteoarthritis of both hands 12/21/2014   Screening for cancer 12/21/2014   Hypogonadism in male 12/21/2014   Essential hypertension 12/15/2013   Paresthesia of both hands 12/15/2013   Generalized anxiety disorder 04/30/2013   Tobacco use disorder 04/30/2013    PCP: Jac Canavan, PA-C REFERRING PROVIDER: Newman Pies, MD  REFERRING DIAG: R42 (ICD-10-CM) - Dizziness  THERAPY DIAG:  No diagnosis found.  ONSET DATE: 12/21/2022 (referral date)   Rationale for Evaluation and Treatment: Rehabilitation  SUBJECTIVE:   SUBJECTIVE STATEMENT: ***  Pt accompanied by: {accompnied:27141}  PERTINENT HISTORY: ***  PAIN:  Are you having pain? {OPRCPAIN:27236}  PRECAUTIONS: {Therapy precautions:24002}  RED FLAGS: {PT Red Flags:29287}   WEIGHT BEARING RESTRICTIONS: {Yes ***/No:24003}  FALLS: Has patient fallen in last 6 months? {fallsyesno:27318}  LIVING ENVIRONMENT: Lives with: {OPRC lives with:25569::"lives with their family"} Lives in: {Lives in:25570} Stairs: {opstairs:27293} Has following equipment at home: {Assistive devices:23999}  PLOF: {PLOF:24004}  PATIENT GOALS: ***  OBJECTIVE:  Note: Objective measures were completed at Evaluation unless otherwise noted.  DIAGNOSTIC FINDINGS: ***  COGNITION: Overall cognitive status: {cognition:24006}   SENSATION: {sensation:27233}  EDEMA:  {edema:24020}  MUSCLE TONE:  {LE tone:25568}  DTRs:  {DTR SITE:24025}  POSTURE:  {posture:25561}  Cervical ROM:    {AROM/PROM:27142} A/PROM (deg) eval  Flexion   Extension  Right lateral flexion   Left lateral flexion   Right rotation   Left rotation   (Blank rows = not tested)  STRENGTH: ***  LOWER EXTREMITY MMT:   MMT Right eval Left eval  Hip flexion    Hip abduction    Hip adduction    Hip internal rotation    Hip external rotation    Knee flexion    Knee extension    Ankle dorsiflexion    Ankle plantarflexion    Ankle  inversion    Ankle eversion    (Blank rows = not tested)  BED MOBILITY:  {Bed mobility:24027}  TRANSFERS: Assistive device utilized: {Assistive devices:23999}  Sit to stand: {Levels of assistance:24026} Stand to sit: {Levels of assistance:24026} Chair to chair: {Levels of assistance:24026} Floor: {Levels of assistance:24026}  RAMP: {Levels of assistance:24026}  CURB: {Levels of assistance:24026}  GAIT: Gait pattern: {gait characteristics:25376} Distance walked: *** Assistive device utilized: {Assistive devices:23999} Level of assistance: {Levels of assistance:24026} Comments: ***  FUNCTIONAL TESTS:  {Functional tests:24029}  PATIENT SURVEYS:  {rehab surveys:24030}  VESTIBULAR ASSESSMENT:  GENERAL OBSERVATION: ***   SYMPTOM BEHAVIOR:  Subjective history: ***  Non-Vestibular symptoms: {nonvestibular symptoms:25260}  Type of dizziness: {Type of Dizziness:25255}  Frequency: ***  Duration: ***  Aggravating factors: {Aggravating Factors:25258}  Relieving factors: {Relieving Factors:25259}  Progression of symptoms: {DESC; BETTER/WORSE:18575}  OCULOMOTOR EXAM:  Ocular Alignment: {Ocular Alignment:25262}  Ocular ROM: {RANGE OF MOTION:21649}  Spontaneous Nystagmus: {Spontaneous nystagmus:25263}  Gaze-Induced Nystagmus: {gaze-induced nystagmus:25264}  Smooth Pursuits: {smooth pursuit:25265}  Saccades: {saccades:25266}  Convergence/Divergence: *** cm   FRENZEL - FIXATION SUPRESSED:  Ocular Alignment: {Ocular Alignment:25262}  Spontaneous Nystagmus: {Spontaneous nystagmus:25263}  Gaze-Induced Nystagmus: {gaze-induced nystagmus:25264}  Horizontal head shaking - induced nystagmus: {head shaking induced nystagmus:25267}  Vertical head shaking - induced nystagmus: {head shaking induced nystagmus:25267}  Positional tests: {Positional tests:25271}  Pressure tests: {frenzel pressure tests:25268}  VESTIBULAR - OCULAR REFLEX:   Slow VOR: {slow VOR:25290}  VOR Cancellation:  {vor cancellation:25291}  Head-Impulse Test: {head impulse test:25272}  Dynamic Visual Acuity: {dynamic visual acuity:25273}   POSITIONAL TESTING: {Positional tests:25271}  MOTION SENSITIVITY:  Motion Sensitivity Quotient Intensity: 0 = none, 1 = Lightheaded, 2 = Mild, 3 = Moderate, 4 = Severe, 5 = Vomiting  Intensity  1. Sitting to supine   2. Supine to L side   3. Supine to R side   4. Supine to sitting   5. L Hallpike-Dix   6. Up from L    7. R Hallpike-Dix   8. Up from R    9. Sitting, head tipped to L knee   10. Head up from L knee   11. Sitting, head tipped to R knee   12. Head up from R knee   13. Sitting head turns x5   14.Sitting head nods x5   15. In stance, 180 turn to L    16. In stance, 180 turn to R     OTHOSTATICS: {Exam; orthostatics:31331}  FUNCTIONAL GAIT: {Functional tests:24029}   VESTIBULAR TREATMENT:  DATE: ***  Canalith Repositioning:  {Canalith Repositioning:25283} Gaze Adaptation:  {gaze adaptation:25286} Habituation:  {habituation:25288} Other: ***  PATIENT EDUCATION: Education details: *** Person educated: {Person educated:25204} Education method: {Education Method:25205} Education comprehension: {Education Comprehension:25206}  HOME EXERCISE PROGRAM:  GOALS: Goals reviewed with patient? {yes/no:20286}  SHORT TERM GOALS: Target date: ***  *** Baseline: Goal status: {GOALSTATUS:25110}  2.  *** Baseline:  Goal status: {GOALSTATUS:25110}  3.  *** Baseline:  Goal status: {GOALSTATUS:25110}  4.  *** Baseline:  Goal status: {GOALSTATUS:25110}  5.  *** Baseline:  Goal status: {GOALSTATUS:25110}  6.  *** Baseline:  Goal status: {GOALSTATUS:25110}  LONG TERM GOALS: Target date: ***  *** Baseline:  Goal status: {GOALSTATUS:25110}  2.  *** Baseline:  Goal status: {GOALSTATUS:25110}  3.  *** Baseline:  Goal  status: {GOALSTATUS:25110}  4.  *** Baseline:  Goal status: {GOALSTATUS:25110}  5.  *** Baseline:  Goal status: {GOALSTATUS:25110}  6.  *** Baseline:  Goal status: {GOALSTATUS:25110}  ASSESSMENT:  CLINICAL IMPRESSION: Patient is a *** y.o. *** who was seen today for physical therapy evaluation and treatment for ***.   OBJECTIVE IMPAIRMENTS: {opptimpairments:25111}.   ACTIVITY LIMITATIONS: {activitylimitations:27494}  PARTICIPATION LIMITATIONS: {participationrestrictions:25113}  PERSONAL FACTORS: {Personal factors:25162} are also affecting patient's functional outcome.   REHAB POTENTIAL: {rehabpotential:25112}  CLINICAL DECISION MAKING: {clinical decision making:25114}  EVALUATION COMPLEXITY: {Evaluation complexity:25115}   PLAN:  PT FREQUENCY: {rehab frequency:25116}  PT DURATION: {rehab duration:25117}  PLANNED INTERVENTIONS: {rehab planned interventions:25118::"97110-Therapeutic exercises","97530- Therapeutic [phone]- Neuromuscular re-education","97535- Self NUUV","25366- Manual therapy"}  PLAN FOR NEXT SESSION: ***   Carmelia Bake, PT 12/26/2022, 8:48 AM

## 2022-12-26 NOTE — Telephone Encounter (Signed)
Good morning,  Patient attempted to be seen for PT today but BP out range: 190/143mmHg. Patient refused to go to ED but recommended follow up with you for management. Patient reports taking himself off BP medication. If you could please follow up that would be greatly appreciated.  Thank you, Maryruth Eve, PT, DPT   [facility] [address] [place], [place]  [address] Phone:  [phone] Fax:  [phone]

## 2022-12-26 NOTE — Telephone Encounter (Signed)
Pt states that he's had so much going on that he hasn't called them back to schedule. He is seeing angela today about medications and then seeing you next week

## 2022-12-26 NOTE — Progress Notes (Signed)
   12/26/2022 Name: Troy Chapman MRN: [id number] DOB: 06-Mar-1955  Chief Complaint  Patient presents with   Hypertension   Medication Management    DUSAN LEFF is a 67 y.o. year old male who presented for a telephone visit.   They were referred to the pharmacist by their PCP for assistance in managing hypertension.    Subjective:  Care Team: Primary Care Provider: Genia Del ; Next Scheduled Visit: 12/30/22   Medication Access/Adherence  Current Pharmacy:  [facility] [address] [place], [place] - [address] [place] [address] Phone: [phone] Fax: [phone]   Patient reports affordability concerns with their medications: No  Patient reports access/transportation concerns to their pharmacy: No  Patient reports adherence concerns with their medications:  Yes - stopping BP meds due to vertigo issues/dizziness. Self stopped rosuvastatin sometime ago when vertigo flared "just didn't feel like taking it"   Hypertension:  Current medications: None Medications previously tried: Amlodipine/Olmesartan, Benazepril, Lisinopril  Patient has a validated, automated, upper arm home BP cuff Current blood pressure readings readings: not using recently, checks infrequently  Patient reports hypotensive s/sx including dizziness (has vertigo and being treated) Patient denies hypertensive symptoms including headache, chest pain, shortness of breath   Objective:  Lab Results  Component Value Date   HGBA1C 5.8 08/22/2022    Lab Results  Component Value Date   CREATININE 0.91 11/06/2020   BUN 9 11/06/2020   NA 141 11/06/2020   K 4.8 11/06/2020   CL 103 11/06/2020   CO2 24 11/06/2020    Lab Results  Component Value Date   CHOL 156 08/15/2021   HDL 75 08/15/2021   LDLCALC 68 08/15/2021   TRIG 69 08/15/2021   CHOLHDL 2.1 08/15/2021    Medications Reviewed Today   Medications were not reviewed in this  encounter       Assessment/Plan:   Hypertension: - Currently uncontrolled - Reviewed long term cardiovascular and renal outcomes of uncontrolled blood pressure - Reviewed appropriate blood pressure monitoring technique and reviewed goal blood pressure. Recommended to check home blood pressure and heart rate daily and keep a log, bring with you to PCP appt - Recommend to RESTART Benazepril 20mg  1/2 tab daily in the evening      Hyperlipidemia/ASCVD Risk Reduction: -Controlled at last check but patient has since stopped taking -RESTART Rosuvastatin 10mg  at bedtime   Follow Up Plan: 01/30/23 (seeing PCP 12/30/22)  Sherrill Raring, PharmD Clinical Pharmacist [phone]

## 2022-12-26 NOTE — Progress Notes (Signed)
   Care Guide Note  12/26/2022 Name: DAL FLAUGH MRN: [id number] DOB: 1955-08-04  Referred by: Jac Canavan, PA-C Reason for referral : Care Coordination (Outreach to schedule with Pharm d )   Troy Chapman is a 67 y.o. year old male who is a primary care patient of Genia Del. Clancey Rudnik Longenecker was referred to the pharmacist for assistance related to HTN.    Successful contact was made with the patient to discuss pharmacy services including being ready for the pharmacist to call at least 5 minutes before the scheduled appointment time, to have medication bottles and any blood sugar or blood pressure readings ready for review. The patient agreed to meet with the pharmacist via with the pharmacist via telephone visit on (date/time).  12/26/2022  Penne Lash , RMA     [place]  [facility], [facility] Guide  Direct Dial: [phone]  Website: [email or web address]

## 2022-12-26 NOTE — Therapy (Signed)
[facility] [address] [place], [place], [address] Phone: [phone]   Fax:  [phone]  Patient Details  Name: Troy Chapman MRN: [id number] Date of Birth: 1955/11/03 Referring Provider:  Newman Pies, MD  Encounter Date: 12/26/2022  Patient session arrive no charge. Patient BP extremely elevated. Patient reported taking himself off BP medication as it causes him to feel less dizzy when he is not taking BP medication. Discussed seriousness of BP readings and need to follow up with PCP. Patient asymptomatic and though recommend states would not like to go to ED at this time. Discussed safe BP ranges. Recommended getting BP under control and then return to PT. Patient verbalizes understanding. BP taken 3x automatic and manual on L arm.      12/26/2022    9:13 AM 12/26/2022    8:59 AM 12/26/2022    8:54 AM  Vitals with BMI  Systolic 190 190 696  Diastolic 114 114 295  Pulse  62 61    Carmelia Bake, PT, DPT 12/26/2022, 9:14 AM  [facility] [address] [place], [place], [address] Phone: [phone]   Fax:  [phone]

## 2022-12-30 ENCOUNTER — Ambulatory Visit: Payer: Medicare Other | Admitting: Medical

## 2022-12-30 ENCOUNTER — Encounter: Payer: Self-pay | Admitting: Medical

## 2022-12-30 ENCOUNTER — Ambulatory Visit (INDEPENDENT_AMBULATORY_CARE_PROVIDER_SITE_OTHER): Payer: Medicare Other | Admitting: Medical

## 2022-12-30 VITALS — BP 150/90 | HR 61 | Ht 67.0 in | Wt 173.4 lb

## 2022-12-30 DIAGNOSIS — J449 Chronic obstructive pulmonary disease, unspecified: Secondary | ICD-10-CM

## 2022-12-30 DIAGNOSIS — I10 Essential (primary) hypertension: Secondary | ICD-10-CM | POA: Diagnosis not present

## 2022-12-30 DIAGNOSIS — Z1211 Encounter for screening for malignant neoplasm of colon: Secondary | ICD-10-CM | POA: Diagnosis not present

## 2022-12-30 DIAGNOSIS — R0989 Other specified symptoms and signs involving the circulatory and respiratory systems: Secondary | ICD-10-CM | POA: Diagnosis not present

## 2022-12-30 DIAGNOSIS — M19042 Primary osteoarthritis, left hand: Secondary | ICD-10-CM

## 2022-12-30 DIAGNOSIS — R42 Dizziness and giddiness: Secondary | ICD-10-CM | POA: Diagnosis not present

## 2022-12-30 DIAGNOSIS — F17200 Nicotine dependence, unspecified, uncomplicated: Secondary | ICD-10-CM

## 2022-12-30 DIAGNOSIS — M19041 Primary osteoarthritis, right hand: Secondary | ICD-10-CM

## 2022-12-30 MED ORDER — TRELEGY ELLIPTA 200-62.5-25 MCG/ACT IN AEPB: 1.0000 | INHALATION_SPRAY | Freq: Every day | RESPIRATORY_TRACT | 11 refills | Status: DC

## 2022-12-30 NOTE — Progress Notes (Signed)
Subjective:  Troy Chapman is a 67 y.o. male who presents for Chief Complaint  Patient presents with   Medical Management of Chronic Issues    Med check, bp has been up can't taking bp meds he was prescribed as it caused him a lot of dizziness. he is currently on prednisone due to Arthitis flare up. PQ-9 abnormal but will take valium to help calm and sleep as needed     Here for follow up on BP.   In prior years he tolerated lisinopril and tolerated amlodipine for years.  However this year he has had issues with dizziness and vertigo.  Every time he tries to go back on blood pressure medicine that he gets dizzy and feels like he is going to pass out.  In recent months he tried Azor and tried separate benazepril but did not tolerate either.  This past week He tried to go back on benazepril and felt really dizzy so he did not take anymore.  We made a referral to cardiology.  He got a phone call from them and has not called them back yet  He continues to smoke.  No chest pain no palpitations or edema.  He has been on Trelegy and Breztri in the past, did better with Trelegy.  Insurance was not covering some of these medications.  He continues to have severe issues with arthritis.  Sees arthritis doctor no medication for this  He has seen ENT a couple different times this year regarding the dizziness and vertigo issues  No other aggravating or relieving factors.    No other c/o.  Past Medical History:  Diagnosis Date   Anger    Anxiety    Arthritis    Humerus fracture    left   Hypertension    Osteoarthritis of hand    Sleep apnea 2016   no cpap, per pt   Smoker    Wears contact lenses    Current Outpatient Medications on File Prior to Visit  Medication Sig Dispense Refill   diazepam (VALIUM) 5 MG tablet Take 1 tablet every night 30 tablet 5   leflunomide (ARAVA) 20 MG tablet Take 20 mg by mouth daily.     meloxicam (MOBIC) 15 MG tablet Take 1 tablet by mouth once daily  90 tablet 0   predniSONE (DELTASONE) 5 MG tablet Take 5 mg by mouth daily with breakfast. 3 tabs daily     rosuvastatin (CRESTOR) 10 MG tablet Take 1 tablet (10 mg total) by mouth daily. 90 tablet 0   Budeson-Glycopyrrol-Formoterol (BREZTRI AEROSPHERE) 160-9-4.8 MCG/ACT AERO Inhale 2 puffs into the lungs 2 (two) times daily. (Patient not taking: Reported on 12/30/2022) 10.7 g 3   No current facility-administered medications on file prior to visit.    The following portions of the patient's history were reviewed and updated as appropriate: allergies, current medications, past family history, past medical history, past social history, past surgical history and problem list.  ROS Otherwise as in subjective above      Objective: BP (!) 150/90   Pulse 61   Ht 5\' 7"  (1.702 m)   Wt 173 lb 6.4 oz (78.7 kg)   BMI 27.16 kg/m   Wt Readings from Last 3 Encounters:  12/30/22 173 lb 6.4 oz (78.7 kg)  12/19/22 165 lb (74.8 kg)  11/05/22 165 lb (74.8 kg)   BP Readings from Last 3 Encounters:  12/30/22 (!) 150/90  12/26/22 (!) 190/114  08/22/22 110/75  General appearance: alert, no distress, well developed, well nourished HEENT: normocephalic, sclerae anicteric, conjunctiva pink and moist, TMs pearly, nares patent, no discharge or erythema, pharynx normal Oral cavity: MMM, no lesions Neck: supple, no lymphadenopathy, no thyromegaly, no masses, no bruits Heart: RRR, normal S1, S2, no murmurs Lungs: decreased breath sounds in general, mild wheezing, no rhonchi, or rales Pulses: 2+ radial pulses, 2+ pedal pulses, normal cap refill Ext: no edema    Assessment: Encounter Diagnoses  Name Primary?   Essential hypertension Yes   Screening for colon cancer    Abnormal lung sounds    Dizziness    Tobacco use disorder    Primary osteoarthritis of both hands      Plan: Hypertension- Recently has not tolerated amlodipine benazepril or separate benazepril due to dizziness.  We  discussed that he likely has a separate unrelated vertigo issue that needs to have follow-up with ENT He has tolerated lisinopril and amlodipine prior but did not tolerate amlodipine earlier this year He still has benazepril 20 mg at home.  I will have him cut them in either half or quarters.  If he can only tolerate quarter tablet a day I will have him do this twice a day for the next few weeks.  I gave him permission to either try quarter tablets or half tablets to get his body used to this Follow-up with cardiology soon We discussed that if his blood pressures continue to be variable all over the place, cardiology might need to do ultrasound of the heart or other testing   COPD Begin back on Trelegy He continues to smoke Abnormal lung exam today   Dizziness-follow-up with ENT   Arthritis managed by rheumatology     [name] was seen today for medical management of chronic issues.  Diagnoses and all orders for this visit:  Essential hypertension  Screening for colon cancer -     Cologuard  Abnormal lung sounds  Dizziness  Tobacco use disorder  Primary osteoarthritis of both hands  Other orders -     Fluticasone-Umeclidin-Vilant (TRELEGY ELLIPTA) 200-62.5-25 MCG/ACT AEPB; Inhale 1 puff into the lungs daily.   Follow up: With cardiology

## 2022-12-31 ENCOUNTER — Ambulatory Visit (INDEPENDENT_AMBULATORY_CARE_PROVIDER_SITE_OTHER): Payer: Medicare Other

## 2023-01-04 ENCOUNTER — Other Ambulatory Visit: Payer: Self-pay | Admitting: Medical

## 2023-01-09 ENCOUNTER — Encounter (INDEPENDENT_AMBULATORY_CARE_PROVIDER_SITE_OTHER): Payer: PRIVATE HEALTH INSURANCE | Admitting: Audiology

## 2023-01-30 ENCOUNTER — Other Ambulatory Visit: Payer: Medicare Other

## 2023-01-30 DIAGNOSIS — S42309A Unspecified fracture of shaft of humerus, unspecified arm, initial encounter for closed fracture: Secondary | ICD-10-CM | POA: Insufficient documentation

## 2023-01-30 DIAGNOSIS — R454 Irritability and anger: Secondary | ICD-10-CM | POA: Insufficient documentation

## 2023-01-30 DIAGNOSIS — I10 Essential (primary) hypertension: Secondary | ICD-10-CM | POA: Insufficient documentation

## 2023-01-30 DIAGNOSIS — M1990 Unspecified osteoarthritis, unspecified site: Secondary | ICD-10-CM | POA: Insufficient documentation

## 2023-01-30 DIAGNOSIS — E785 Hyperlipidemia, unspecified: Secondary | ICD-10-CM

## 2023-01-30 DIAGNOSIS — Z973 Presence of spectacles and contact lenses: Secondary | ICD-10-CM | POA: Insufficient documentation

## 2023-01-30 DIAGNOSIS — F419 Anxiety disorder, unspecified: Secondary | ICD-10-CM | POA: Insufficient documentation

## 2023-01-30 DIAGNOSIS — M19049 Primary osteoarthritis, unspecified hand: Secondary | ICD-10-CM | POA: Insufficient documentation

## 2023-01-30 NOTE — Progress Notes (Signed)
01/30/2023 Name: Troy Chapman MRN: [id number] DOB: [birth year]  Chief Complaint  Patient presents with   Medication Management   Hypertension    Troy Chapman is a 67 y.o. year old male who presented for a telephone visit.   They were referred to the pharmacist by their PCP for assistance in managing hypertension.    Subjective:  Care Team: Primary Care Provider: Aleen Campi Cleda Mccreedy ;   Medication Access/Adherence  Current Pharmacy:  [facility] [address] [place], [place] - [address] [place] [address] Phone: [phone] Fax: [phone]   Patient reports affordability concerns with their medications: No  Patient reports access/transportation concerns to their pharmacy: No  Patient reports adherence concerns with their medications:  Yes - stopping BP meds due to vertigo issues/dizziness. Self stopped rosuvastatin sometime ago when vertigo flared "just didn't feel like taking it"   Hypertension:  Current medications: None Medications previously tried: Amlodipine/Olmesartan, Benazepril, Lisinopril  Patient has a validated, automated, upper arm home BP cuff Current blood pressure readings: has not checked in the last week, says he caught the flu and wasn't feeling well, starting to feel better now. Tries to check every other day and reports in the last month he has been seeing readings in the 120s/90, with some excursions on occasion into 147/84.   Patient reports hypotensive s/sx including dizziness (has vertigo and being treated (rehab on pause due to elevated BP) Patient denies hypertensive symptoms including headache, chest pain, shortness of breath   Objective:  Lab Results  Component Value Date   HGBA1C 5.8 08/22/2022    Lab Results  Component Value Date   CREATININE 0.91 11/06/2020   BUN 9 11/06/2020   NA 141 11/06/2020   K 4.8 11/06/2020   CL 103 11/06/2020   CO2 24 11/06/2020    Lab Results   Component Value Date   CHOL 156 08/15/2021   HDL 75 08/15/2021   LDLCALC 68 08/15/2021   TRIG 69 08/15/2021   CHOLHDL 2.1 08/15/2021    Medications Reviewed Today     Reviewed by Sherrill Raring, RPH (Pharmacist) on 01/30/23 at 1524  Med List Status: <None>   Medication Order Taking? Sig Documenting Provider Last Dose Status Informant  Certolizumab [facility]ol [facility]) [id number] Yes Inject into the skin. [provider]  Active   diazepam (VALIUM) 5 MG tablet [id number]  Take 1 tablet every night Van Clines, MD  Active   Fluticasone-Umeclidin-Vilant (TRELEGY ELLIPTA) 200-62.5-25 MCG/ACT AEPB [id number]  Inhale 1 puff into the lungs daily. Tysinger, Kermit Balo, PA-C  Active   leflunomide (ARAVA) 20 MG tablet [id number]  Take 20 mg by mouth daily. [provider]  Active   meloxicam (MOBIC) 15 MG tablet [id number]  Take 1 tablet by mouth once daily Tysinger, Kermit Balo, PA-C  Active   predniSONE (DELTASONE) 5 MG tablet [id number]  Take 5 mg by mouth daily with breakfast. 3 tabs daily [provider]  Active Self  rosuvastatin (CRESTOR) 10 MG tablet [id number] No Take 1 tablet (10 mg total) by mouth daily.  Patient not taking: Reported on 01/30/2023   Jac Canavan, PA-C Not Taking Active Self           Med Note Theodis Sato Dec 26, 2022  3:36 PM) Plans to restart 12/26/22 pm dosing              Assessment/Plan:   Hypertension: - Currently  uncontrolled - Reviewed long term cardiovascular and renal outcomes of uncontrolled blood pressure - Reviewed appropriate blood pressure monitoring technique and reviewed goal blood pressure. Recommended to check home blood pressure and heart rate daily and keep a log, bring with you to PCP appt -Scheduled follow up for after patient sees cardiology     Hyperlipidemia/ASCVD Risk Reduction: -Controlled at last check but patient has since stopped taking -RESTART Rosuvastatin 10mg  at bedtime   Follow Up  Plan: 03/06/23  Sherrill Raring, PharmD Clinical Pharmacist [phone]

## 2023-01-31 ENCOUNTER — Ambulatory Visit: Payer: Medicare Other | Attending: Cardiovascular Disease | Admitting: Cardiovascular Disease

## 2023-01-31 ENCOUNTER — Encounter: Payer: Self-pay | Admitting: Cardiovascular Disease

## 2023-01-31 VITALS — BP 154/91 | HR 95 | Ht 67.0 in | Wt 168.6 lb

## 2023-01-31 DIAGNOSIS — I4891 Unspecified atrial fibrillation: Secondary | ICD-10-CM | POA: Diagnosis present

## 2023-01-31 DIAGNOSIS — I10 Essential (primary) hypertension: Secondary | ICD-10-CM | POA: Diagnosis present

## 2023-01-31 DIAGNOSIS — I701 Atherosclerosis of renal artery: Secondary | ICD-10-CM | POA: Diagnosis present

## 2023-01-31 DIAGNOSIS — L4050 Arthropathic psoriasis, unspecified: Secondary | ICD-10-CM | POA: Insufficient documentation

## 2023-01-31 DIAGNOSIS — F1010 Alcohol abuse, uncomplicated: Secondary | ICD-10-CM | POA: Insufficient documentation

## 2023-01-31 DIAGNOSIS — G4733 Obstructive sleep apnea (adult) (pediatric): Secondary | ICD-10-CM | POA: Diagnosis present

## 2023-01-31 DIAGNOSIS — E7800 Pure hypercholesterolemia, unspecified: Secondary | ICD-10-CM | POA: Diagnosis present

## 2023-01-31 DIAGNOSIS — F17200 Nicotine dependence, unspecified, uncomplicated: Secondary | ICD-10-CM | POA: Insufficient documentation

## 2023-01-31 MED ORDER — APIXABAN 5 MG PO TABS: 5.0000 mg | ORAL_TABLET | Freq: Two times a day (BID) | ORAL | 11 refills | Status: DC

## 2023-01-31 MED ORDER — OLMESARTAN MEDOXOMIL 20 MG PO TABS: 20.0000 mg | ORAL_TABLET | Freq: Every day | ORAL | 11 refills | Status: DC

## 2023-01-31 NOTE — Progress Notes (Signed)
Cardiology Office Note:    Date:  01/31/2023   ID:  Troy Chapman, DOB [birth year], MRN [id number]  PCP:  Genia Del   [facility] Providers Cardiologist:  None     Referring MD: Jac Canavan, PA-C   No chief complaint on file. Troy Chapman is a 68 y.o. male who is being seen today for the evaluation of uncontrolled HTN at the request of Genia Del. He is incidentally found to have atrial fibrillation today.   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 The primary concern is vertigo, which began in the summer and was severe enough to impair daily functioning. The patient self-discontinued his blood pressure medication, which led to a significant improvement in vertigo symptoms, but not a complete resolution. The patient reports occasional vertigo when looking up rapidly. An ENT specialist suggested therapy for the vertigo, attributing it to an issue with communication between the ear and the brain. However, therapy was postponed due to significantly elevated blood pressure readings.  The patient also has a history of psoriatic arthritis, which recently flared up, causing severe swelling in the hands and knees. The patient describes the hands as looking like "boxing gloves." The flare-up was managed with a regimen of prednisone, which is currently being tapered down. The patient also takes meloxicam and leflunomide for inflammation related to the arthritis. The patient recently started Cimzia infusions, hoping it will replace the need for meloxicam.  The patient presents today with atrial fibrillation (AFib), spontaneously rate controlled.  He does not report feeling any heart rhythm abnormalities. The patient has a history of high blood pressure, which has always been elevated, even in childhood. The patient also has a history of sleep apnea, which is managed  by sleeping in a sitting position, as the patient could not tolerate CPAP. The patient has not tried a dental device for sleep apnea: he was told his OSA was too severe for that.  The patient has a smoking history of a pack a day for over 50 years and drinks about a bottle of bourbon a week. The patient had a calcium score done in 2023, which was slightly worse than average (65th percentile), also with atherosclerotic plaque in the aorta. The patient was previously on rosuvastatin for cholesterol management but did not take it in a long time. He has recently restarted it. The patient also had a recent bout of bronchitis, which was managed with antibiotics.  Past Medical History:  Diagnosis Date   Abnormal lung field 11/06/2020   Abnormal lung sounds 08/15/2021   Anger    Anxiety    Aortic atherosclerosis (HCC) 10/29/2021   Arthritis    Benign paroxysmal vertigo of right ear 12/21/2022   Chronic abdominal pain 08/15/2021   Chronic diarrhea 08/15/2021   Contracture of joint of both hands 09/19/2017   Coronary artery disease involving native coronary artery of native heart without angina pectoris 10/29/2021   Cyclic citrullinated peptide (CCP) antibody positive 12/26/2020   Diarrhea 11/06/2020   Dizziness 11/07/2022   Epigastric pain 11/06/2020   Essential hypertension 12/15/2013   Fatigue 09/19/2017   Generalized anxiety disorder 04/30/2013   High risk medication use 11/06/2020   Humerus fracture    left   Hyperlipidemia 10/29/2021   Hypertension    Hypogonadism in male 12/21/2014   Low testosterone 09/19/2017   Open fracture of tooth  02/05/2019   Osteoarthritis of hand    Paresthesia of both hands 12/15/2013   Primary osteoarthritis of both hands 12/21/2014   Psoriasis 09/08/2019   Pulmonary nodule 10/29/2021   Screening for cancer 12/21/2014   Screening for heart disease 08/15/2021   Screening for lipid disorders 08/15/2021   Screening for prostate cancer 08/15/2021    Sensorineural hearing loss, bilateral 11/07/2022   Sleep apnea 2016   no cpap, per pt   Tobacco use disorder 04/30/2013   Wears contact lenses    Weight loss 11/06/2020    Past Surgical History:  Procedure Laterality Date   COLONOSCOPY  2012   Dr. Elnoria Howard   KNEE ARTHROSCOPY     right x 3   ORIF HUMERUS FRACTURE Left 02/04/2020   Procedure: OPEN REDUCTION INTERNAL FIXATION (ORIF) HUMERAL SHAFT FRACTURE;  Surgeon: Sheral Apley, MD;  Location: [facility];  Service: Orthopedics;  Laterality: Left;  block   SHOULDER ARTHROSCOPY     left x 2, and RTC    Current Medications: Current Meds  Medication Sig   apixaban (ELIQUIS) 5 MG TABS tablet Take 1 tablet (5 mg total) by mouth 2 (two) times daily.   Certolizumab Pegol (CIMZIA [place]) Inject into the skin.   diazepam (VALIUM) 5 MG tablet Take 1 tablet every night   leflunomide (ARAVA) 20 MG tablet Take 20 mg by mouth daily.   meloxicam (MOBIC) 15 MG tablet Take 1 tablet by mouth once daily   olmesartan (BENICAR) 20 MG tablet Take 1 tablet (20 mg total) by mouth daily.   predniSONE (DELTASONE) 5 MG tablet Take 5 mg by mouth daily with breakfast. 3 tabs daily   rosuvastatin (CRESTOR) 10 MG tablet Take 1 tablet (10 mg total) by mouth daily.     Allergies:   Azor [amlodipine-olmesartan], Benazepril, and Gabapentin   Social History   Socioeconomic History   Marital status: Married    Spouse name: Not on file   Number of children: Not on file   Years of education: Not on file   Highest education level: Not on file  Occupational History   Not on file  Tobacco Use   Smoking status: Every Day    Current packs/day: 1.00    Average packs/day: 1 pack/day for 50.0 years (50.0 ttl pk-yrs)    Types: Cigarettes   Smokeless tobacco: Never   Tobacco comments:    01/31/2023 Patient smokes a pack daily  Vaping Use   Vaping status: Never Used  Substance and Sexual Activity   Alcohol use: Yes    Alcohol/week: 10.0 standard  drinks of alcohol    Types: 10 Glasses of wine per week    Comment: every day beers and then some wine   Drug use: No   Sexual activity: Not on file  Other Topics Concern   Not on file  Social History Narrative   Married, 2 children, owns Holiday representative business, active on the job. Right handed   Social Drivers of Corporate investment banker Strain: Not on file  Food Insecurity: Not on file  Transportation Needs: Not on file  Physical Activity: Not on file  Stress: Not on file  Social Connections: Not on file     Family History: [name] family history includes Alzheimer's disease in his father; Dementia in his father; Heart disease in his mother; Stroke in his maternal grandmother. There is no history of Cancer.  ROS:   Please see the history of present illness.  All other systems reviewed and are negative.  EKGs/Labs/Other Studies Reviewed:    The following studies were reviewed today: Coronary calcium CT score:  1. Total calcium score of 153 is at percentile 65 for subjects of the same age, gender, and race/ethnicity. 2. Tiny solid pulmonary nodules which are most pronounced at the lung bases, largest measures 3 mm, likely sequela of infection or aspiration. No follow-up needed if patient is low-risk (and has no known or suspected primary neoplasm). Non-contrast chest CT can be considered in 12 months if patient is high-risk. This recommendation follows the consensus statement: Guidelines for Management of Incidental Pulmonary Nodules Detected on CT Images: From the Fleischner Society 2017; Radiology 2017; 284:228-243. 3. Mild aortic valve calcifications, findings can be seen the setting of aortic sclerosis or stenosis. Correlate with echocardiography. 4. Mild aortic Atherosclerosis (ICD10-I70.0).   EKG Interpretation Date/Time:  Friday January 31 2023 15:49:43 EST Ventricular Rate:  95 PR Interval:    QRS Duration:  68 QT Interval:  338 QTC  Calculation: 424 R Axis:   -16  Text Interpretation: Atrial fibrillation Cannot rule out Inferior infarct , age undetermined When compared with ECG of 04-Feb-2020 11:16, Atrial fibrillation has replaced Sinus rhythm Inferior infarct is now Present Confirmed by Nickalos Petersen [phone]) on 01/31/2023 4:00:11 PM    Recent Labs: 08/22/2022: TSH 1.45  Recent Lipid Panel    Component Value Date/Time   CHOL 156 08/15/2021 1040   TRIG 69 08/15/2021 1040   HDL 75 08/15/2021 1040   CHOLHDL 2.1 08/15/2021 1040   CHOLHDL 1.7 12/21/2014 0001   VLDL 11 12/21/2014 0001   LDLCALC 68 08/15/2021 1040     Risk Assessment/Calculations:    CHA2DS2-VASc Score = 3   This indicates a 3.2% annual risk of stroke. [name] score is based upon: CHF History: 0 HTN History: 1 Diabetes History: 0 Stroke History: 0 Vascular Disease History: 1 Age Score: 1 Gender Score: 0     HYPERTENSION CONTROL Vitals:   01/31/23 1546 01/31/23 1715  BP: (!) 140/100 (!) 154/91    [name] blood pressure is elevated above target today.  In order to address the patient's elevated BP: A new medication was prescribed today.            Physical Exam:    VS:  BP (!) 154/91   Pulse 95   Ht 5\' 7"  (1.702 m)   Wt 168 lb 9.6 oz (76.5 kg)   SpO2 96%   BMI 26.41 kg/m     Wt Readings from Last 3 Encounters:  01/31/23 168 lb 9.6 oz (76.5 kg)  12/30/22 173 lb 6.4 oz (78.7 kg)  12/19/22 165 lb (74.8 kg)     GEN:  Well nourished, well developed in no acute distress HEENT: Normal NECK: No JVD; No carotid bruits LYMPHATICS: No lymphadenopathy CARDIAC: irregular, no murmurs, rubs, gallops RESPIRATORY:  RLL wheezing and rhonchi  ABDOMEN: Soft, non-tender, non-distended MUSCULOSKELETAL:  No edema; No deformity  SKIN: Warm and dry NEUROLOGIC:  Alert and oriented x 3 PSYCHIATRIC:  Normal affect   ASSESSMENT:    1. New onset atrial fibrillation (HCC)   2. Essential hypertension   3. Renal artery stenosis  (HCC)   4. OSA (obstructive sleep apnea)   5. Psoriatic arthritis (HCC)    PLAN:    In order of problems listed above:  Afib:  Newly diagnosed paroxysmal atrial fibrillation (AFib) identified on EKG. Spontaneously rate controlled (sign of AV node disease). Asymptomatic, unaware of arrhythmia.  Heart rate in normal rhythm is usually 60. Avoid beta blockers. Discussed increased stroke risk. Contributing factors include poorly controlled hypertension, untreated sleep apnea, and significant alcohol consumption. Since he will likely need to continue NSAID therapy, discussed Watchman device as an alternative to long-term anticoagulation with similar efficacy but small procedural risk. I have seen Troy Chapman is a 68 y.o. male in the office today who is being considered for a Watchman left atrial appendage closure device.  He has a history of paroxysmal atrial fibrillation.  This patients CHA2DS2-VASc Score and unadjusted Ischemic Stroke Rate (% per year) is equal to 3.2 % stroke rate/year from a score of 3 which necessitates long term oral anticoagulation to prevent stroke.  Unfortunately, He is not felt to be a long term Warfarin candidate secondary to chronic NSAID therapy for psoriatic arthritis.  The patients chart has been reviewed and I feel that they would be a candidate for short term oral anticoagulation.  Procedural risks for the Watchman implant have been reviewed with the patient including a 1% risk of stroke, 2% risk of perforation, 0.1% risk of device embolization.  Given the patient's poor candidacy for long-term oral anticoagulation and ability to tolerate short term oral anticoagulation I have recommended the watchman left atrial appendage closure system.  - Start Eliquis (apixaban) for stroke prevention - Order echocardiogram to assess heart structure and function - Discuss Watchman device as a long-term solution - Refer to electrophysiologists Dr. Lalla Brothers and Dr. Jimmey Ralph for Watchman  device consultation - Recommend reducing alcohol intake - Encourage use of a self-testing device for rhythm monitoring (Kardia or similar) - Sign up for MyChart for direct communication  2.  Hypertension Uncontrolled hypertension with significantly elevated readings. Previously discontinued amlodipine due to severe vertigo (may have been coincidental). Blood pressure control needed to facilitate ENT therapy for vertigo. - Start olmesartan for blood pressure management - Keep a log of blood pressure and heart rate once daily when calm and relaxed - Order renal duplex ultrasound to rule out renal artery stenosis   3.  Sleep Apnea Untreated sleep apnea, previously unable to tolerate CPAP. Candidate for the Inspire device due to lean body habitus. Discussed potential benefits of the Inspire device in maintaining airway patency during sleep. - Refer to Dr. Christia Reading (ENT) for evaluation of Inspire device  4.  Psoriatic Arthritis Psoriatic arthritis with recent severe flare-up, leading to significant swelling and pain in hands and knees. Currently on a tapering dose of prednisone, meloxicam, and leflunomide. Recently started Cimzia (certolizumab) injections.  5.  Smoking (one pack per day for 50+ years) and recent bronchitis treated with antibiotics.  - Advise smoking cessation - Monitor respiratory symptoms and consider further evaluation if symptoms persist  6.  Excessive alcohol use - advised reduction to no more than 1-2 drinks a day  7. HLP: continue rosuvastatin. Due for a repeat lipid profile.  Follow-up - Schedule follow-up appointment with physician assistant or nurse practitioner. Bring BP log - Ensure completion of renal duplex ultrasound and echocardiogram before next visit.           Medication Adjustments/Labs and Tests Ordered: Current medicines are reviewed at length with the patient today.  Concerns regarding medicines are outlined above.  Orders Placed This  Encounter  Procedures   Ambulatory referral to Cardiac Electrophysiology   Ambulatory referral to ENT   EKG 12-Lead   ECHOCARDIOGRAM COMPLETE   VAS US RENAL ARTERY DUPLEX   Meds ordered this encounter  Medications  olmesartan (BENICAR) 20 MG tablet    Sig: Take 1 tablet (20 mg total) by mouth daily.    Dispense:  30 tablet    Refill:  11   apixaban (ELIQUIS) 5 MG TABS tablet    Sig: Take 1 tablet (5 mg total) by mouth 2 (two) times daily.    Dispense:  60 tablet    Refill:  11    Patient Instructions  Medication Instructions:  START  OLMESARTAN 20 MG DAILY ELIQUIS 5 MG TWICE A DAY *If you need a refill on your cardiac medications before your next appointment, please call your pharmacy*  Testing/Procedures: Your physician has requested that you have an echocardiogram. Echocardiography is a painless test that uses sound waves to create images of your heart. It provides your doctor with information about the size and shape of your heart and how well your heart's chambers and valves are working. This procedure takes approximately one hour. There are no restrictions for this procedure. Please do NOT wear cologne, perfume, aftershave, or lotions (deodorant is allowed). Please arrive 15 minutes prior to your appointment time.  Please note: We ask at that you not bring children with you during ultrasound (echo/ vascular) testing. Due to room size and safety concerns, children are not allowed in the ultrasound rooms during exams. Our front office staff cannot provide observation of children in our lobby area while testing is being conducted. An adult accompanying a patient to their appointment will only be allowed in the ultrasound room at the discretion of the ultrasound technician under special circumstances. We apologize for any inconvenience.   Your physician has requested that you have a renal artery duplex. During this test, an ultrasound is used to evaluate blood flow to the kidneys.  Allow one hour for this exam. Do not eat after midnight the day before and avoid carbonated beverages. Take your medications as you usually do.    Follow-Up: At [facility], you and your health needs are our priority.  As part of our continuing mission to provide you with exceptional heart care, we have created designated Provider Care Teams.  These Care Teams include your primary Cardiologist (physician) and Advanced Practice Providers (APPs -  Physician Assistants and Nurse Practitioners) who all work together to provide you with the care you need, when you need it.  We recommend signing up for the patient portal called "MyChart".  Sign up information is provided on this After Visit Summary.  MyChart is used to connect with patients for Virtual Visits (Telemedicine).  Patients are able to view lab/test results, encounter notes, upcoming appointments, etc.  Non-urgent messages can be sent to your provider as well.   To learn more about what you can do with MyChart, go to [email or web address].    Your next appointment:   APP- 2-4 Weeks  Dr Royann Shivers- 3-4 months          Signed, Thurmon Fair, MD  01/31/2023 5:26 PM    [facility]

## 2023-01-31 NOTE — Patient Instructions (Signed)
Medication Instructions:  START  OLMESARTAN 20 MG DAILY ELIQUIS 5 MG TWICE A DAY *If you need a refill on your cardiac medications before your next appointment, please call your pharmacy*  Testing/Procedures: Your physician has requested that you have an echocardiogram. Echocardiography is a painless test that uses sound waves to create images of your heart. It provides your doctor with information about the size and shape of your heart and how well your heart's chambers and valves are working. This procedure takes approximately one hour. There are no restrictions for this procedure. Please do NOT wear cologne, perfume, aftershave, or lotions (deodorant is allowed). Please arrive 15 minutes prior to your appointment time.  Please note: We ask at that you not bring children with you during ultrasound (echo/ vascular) testing. Due to room size and safety concerns, children are not allowed in the ultrasound rooms during exams. Our front office staff cannot provide observation of children in our lobby area while testing is being conducted. An adult accompanying a patient to their appointment will only be allowed in the ultrasound room at the discretion of the ultrasound technician under special circumstances. We apologize for any inconvenience.   Your physician has requested that you have a renal artery duplex. During this test, an ultrasound is used to evaluate blood flow to the kidneys. Allow one hour for this exam. Do not eat after midnight the day before and avoid carbonated beverages. Take your medications as you usually do.    Follow-Up: At [facility], you and your health needs are our priority.  As part of our continuing mission to provide you with exceptional heart care, we have created designated Provider Care Teams.  These Care Teams include your primary Cardiologist (physician) and Advanced Practice Providers (APPs -  Physician Assistants and Nurse Practitioners) who all work  together to provide you with the care you need, when you need it.  We recommend signing up for the patient portal called "MyChart".  Sign up information is provided on this After Visit Summary.  MyChart is used to connect with patients for Virtual Visits (Telemedicine).  Patients are able to view lab/test results, encounter notes, upcoming appointments, etc.  Non-urgent messages can be sent to your provider as well.   To learn more about what you can do with MyChart, go to [email or web address].    Your next appointment:   APP- 2-4 Weeks  Dr Royann Shivers- 3-4 months

## 2023-02-03 ENCOUNTER — Telehealth: Payer: Self-pay | Admitting: Cardiovascular Disease

## 2023-02-03 NOTE — Telephone Encounter (Signed)
Patient identification verified by 2 forms. Troy Rail, RN    Called and spoke to patient  Patient states:   -back has been bothering him for several weeks   -at visit with Dr. Royann Shivers discussed possible issue with kidneys   -noticed he has sore/pain over the left kidney   -he is concerned about back pain related to possible kidney issue   -aware of [place] schedule for 03/06/22, unsure if given sx if anything should be done sooner  Patient denies:   -change to urination (dark colors)   -decreased urination Informed patient message sent to Dr. Royann Shivers for input/advisement  Patient verbalized understanding, no questions at this time

## 2023-02-03 NOTE — Telephone Encounter (Signed)
Pt reports that his pain is localized over his left kidney but denies any other symptoms. Informed him that Dr Croitoru's recommendation is to contact his PCP and to get a urinalysis. He verbalized understanding and agrees with the plan of care

## 2023-02-03 NOTE — Telephone Encounter (Signed)
We were getting the [place] to look for renal artery stenosis, which does not cause pain. It may be more useful to check a urinalysis to see if there is evidence of infection or kidney stones.

## 2023-02-03 NOTE — Telephone Encounter (Signed)
Patient needs appointment 2-4 weeks with an APP

## 2023-02-03 NOTE — Telephone Encounter (Signed)
Pt requesting cb to discuss [place] and kidneys due to pain

## 2023-02-04 ENCOUNTER — Ambulatory Visit: Payer: Medicare Other | Admitting: Medical

## 2023-02-04 ENCOUNTER — Ambulatory Visit: Payer: PRIVATE HEALTH INSURANCE

## 2023-02-04 VITALS — BP 120/80 | HR 52 | Temp 97.9°F | Wt 168.0 lb

## 2023-02-04 DIAGNOSIS — Z122 Encounter for screening for malignant neoplasm of respiratory organs: Secondary | ICD-10-CM | POA: Diagnosis not present

## 2023-02-04 DIAGNOSIS — F17200 Nicotine dependence, unspecified, uncomplicated: Secondary | ICD-10-CM

## 2023-02-04 DIAGNOSIS — M545 Low back pain, unspecified: Secondary | ICD-10-CM | POA: Diagnosis not present

## 2023-02-04 DIAGNOSIS — I10 Essential (primary) hypertension: Secondary | ICD-10-CM | POA: Diagnosis not present

## 2023-02-04 LAB — POCT URINALYSIS DIP (PROADVANTAGE DEVICE)
Bilirubin, UA: NEGATIVE
Blood, UA: NEGATIVE
Glucose, UA: NEGATIVE mg/dL
Ketones, POC UA: NEGATIVE mg/dL
Leukocytes, UA: NEGATIVE
Nitrite, UA: NEGATIVE
Protein Ur, POC: NEGATIVE mg/dL
Specific Gravity, Urine: 1.015
Urobilinogen, Ur: NEGATIVE
pH, UA: 6 (ref 5.0–8.0)

## 2023-02-04 NOTE — Progress Notes (Signed)
Subjective:  Troy Chapman is a 68 y.o. male who presents for Chief Complaint  Patient presents with   Back Pain    Back pain x a couple week, pain on left side. Cardiology has ordered U/S on 2/21 to look at kidneys   Here for back pain.    He originally discussed this with cardiology yesterday and they advise he follow-up with PCP  He notes pain in the left mid back going on for weeks.  No injury or trauma.  He has really bad arthritis in general and sees a specialist for this.  This pain is not as bad as his typical arthritis but is just nagging constant pain in the left mid back  No rash, no fever, no other body aches or chills out of his normal arthritis pain  No recent fall or trauma.  No urinary complaint, no bowel complaint, no blood in urine or stool  Given his uncontrolled blood pressure issues cardiology is pursuing ultrasound of his renal artery flow.  He also recently diagnosed with A-fib and was advised to start Eliquis but he found out yesterday that the Eliquis is going to be a co-pay of almost $500 and he cannot afford that.  No other aggravating or relieving factors.    No other c/o.  Past Medical History:  Diagnosis Date   Abnormal lung field 11/06/2020   Abnormal lung sounds 08/15/2021   Anger    Anxiety    Aortic atherosclerosis (HCC) 10/29/2021   Arthritis    Benign paroxysmal vertigo of right ear 12/21/2022   Chronic abdominal pain 08/15/2021   Chronic diarrhea 08/15/2021   Contracture of joint of both hands 09/19/2017   Coronary artery disease involving native coronary artery of native heart without angina pectoris 10/29/2021   Cyclic citrullinated peptide (CCP) antibody positive 12/26/2020   Diarrhea 11/06/2020   Dizziness 11/07/2022   Epigastric pain 11/06/2020   Essential hypertension 12/15/2013   Fatigue 09/19/2017   Generalized anxiety disorder 04/30/2013   High risk medication use 11/06/2020   Humerus fracture    left   Hyperlipidemia  10/29/2021   Hypertension    Hypogonadism in male 12/21/2014   Low testosterone 09/19/2017   Open fracture of tooth 02/05/2019   Osteoarthritis of hand    Paresthesia of both hands 12/15/2013   Primary osteoarthritis of both hands 12/21/2014   Psoriasis 09/08/2019   Pulmonary nodule 10/29/2021   Screening for cancer 12/21/2014   Screening for heart disease 08/15/2021   Screening for lipid disorders 08/15/2021   Screening for prostate cancer 08/15/2021   Sensorineural hearing loss, bilateral 11/07/2022   Sleep apnea 2016   no cpap, per pt   Tobacco use disorder 04/30/2013   Wears contact lenses    Weight loss 11/06/2020   Current Outpatient Medications on File Prior to Visit  Medication Sig Dispense Refill   Certolizumab Pegol (CIMZIA Rudolph) Inject into the skin.     diazepam (VALIUM) 5 MG tablet Take 1 tablet every night 30 tablet 5   Fluticasone-Umeclidin-Vilant (TRELEGY ELLIPTA) 200-62.5-25 MCG/ACT AEPB Inhale 1 puff into the lungs daily. 28 each 11   leflunomide (ARAVA) 20 MG tablet Take 20 mg by mouth daily.     meloxicam (MOBIC) 15 MG tablet Take 1 tablet by mouth once daily 90 tablet 0   predniSONE (DELTASONE) 5 MG tablet Take 5 mg by mouth daily with breakfast. 3 tabs daily     rosuvastatin (CRESTOR) 10 MG tablet Take 1 tablet (10 mg  total) by mouth daily. 90 tablet 0   apixaban (ELIQUIS) 5 MG TABS tablet Take 1 tablet (5 mg total) by mouth 2 (two) times daily. (Patient not taking: Reported on 02/04/2023) 60 tablet 11   olmesartan (BENICAR) 20 MG tablet Take 1 tablet (20 mg total) by mouth daily. (Patient not taking: Reported on 02/04/2023) 30 tablet 11   No current facility-administered medications on file prior to visit.     The following portions of the patient's history were reviewed and updated as appropriate: allergies, current medications, past family history, past medical history, past social history, past surgical history and problem list.  ROS Otherwise as in  subjective above  Objective: BP 120/80   Pulse (!) 52   Temp 97.9 F (36.6 C)   Wt 168 lb (76.2 kg)   BMI 26.31 kg/m   General appearance: alert, no distress, well developed, well nourished Abdomen: +bs, soft, non tender, non distended, no masses, no hepatomegaly, no splenomegaly He has some spasms in the paraspinal lumbar region, but the rest of back nontender to palpation, no obvious swelling or deformity, range of motion relatively normal and that does not seem to aggravate the pain in his left back Thighs and arms nontender, hip range of motion normal without pain radiating to the back Pulses: 2+ radial pulses, 2+ pedal pulses, normal cap refill Ext: no edema Legs neurovascularly intact    Assessment: Encounter Diagnoses  Name Primary?   Acute left-sided low back pain without sciatica Yes   Smoker    Screening for lung cancer    Hypertension, unspecified type      Plan: We discussed symptoms of possible differential for left mid back pain.  I recommended a baseline x-ray today but he declines.  Urinalysis unremarkable today thankfully.  He also last had a CT coronary test which looked at the lungs as well back in 2023.  I recommended repeat CT lung cancer screening yearly but he declines today  Advised stretching, continue his current pain regimen, consider heat to the left mid back as needed.  If worse in the next few days call back and we will pursue back x-ray  He is having ultrasound per cardiology of his kidney blood flow soon  He was just diagnosed with A-fib and started on Eliquis but cannot afford the Eliquis.  I will reach out to cardiology to let them know the look at some other medication option  [name] was seen today for back pain.  Diagnoses and all orders for this visit:  Acute left-sided low back pain without sciatica -     POCT Urinalysis DIP (Proadvantage Device)  Smoker  Screening for lung cancer  Hypertension, unspecified  type    Follow up: pending call back

## 2023-02-25 NOTE — Progress Notes (Unsigned)
Cardiology Office Note   Date:  02/26/2023  ID:  Troy Chapman, Troy Chapman 1955-09-02, MRN [id number] PCP:  [facility] Cardiologist: Thurmon Fair, MD  Reason for visit: Follow-up  History of Present Illness    Troy Chapman is a 68 y.o. male with a hx of significant vertigo, psoriatic arthritis causing severe swelling in hands and knees.  History of sleep apnea, managed by sleeping in a sitting position, cannot tolerate CPAP.  History of tobacco use and drinking a bottle of bourbon a week.  Calcium score 2023 the 65th percentile.  He was seen by Dr. Royann Shivers in January 31, 2023 for new atrial fibrillation.  Patient was unaware of arrhythmia.  His typical heart rate in normal rhythm is around 60.  It was thought that contributing factors to this new A-fib included poorly controlled hypertension, untreated sleep apnea and significant alcohol consumption.  Dr. Royann Shivers started him on Eliquis for stroke prevention.  Referred to EP for Watchman device consult as patient takes long-term NSAIDs for his psoriatic arthritis.  Recommend reducing alcohol intake.  Echo ordered.  Started on olmesartan for blood pressure management.  Renal duplex ordered.  Referred to ENT to see if he is an inspire device candidate.  He followed up with his PCP and PCP noted that patient had difficulty affording Eliquis.  Today, he states he feels increased fatigue and decreased stamina, worsening over the past 6 months. 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  He states decreased energy and his chronic arthritis pain has significantly impact his productivity and income.  Particularly since his diagnosis of A-fib, he notices his heart occasionally fluttering and racing.  He may notice this a few times per day.  He states his heart rate has been typically in the 70s when he checks his blood pressure.  He states his blood pressure has run 150s over 100s to 120  at home.  He states some of this may be due to chronic arthritis pain.  He takes meloxicam 15 mg daily for arthritis and sometimes uses prednisone Dosepaks for flares.  He has dyspnea with moderate exertion.  He continues to smoke.  He denies chest pain, LE edema and dizziness.  He previously had dizziness when taking Azor (amlodipine-olmesartan).  Patient states he is not taking Eliquis because the cost is over $400.  He states decreasing his alcohol intake is difficult as this helps his chronic pain.  He states his providers will not give him pain medications.    Objective / Physical Exam   Vital signs:  BP (!) 154/100   Pulse 70   Ht 5\' 7"  (1.702 m)   Wt 158 lb (71.7 kg)   SpO2 94%   BMI 24.75 kg/m     GEN: No acute distress NECK: No carotid bruits CARDIAC: RRR, no murmurs RESPIRATORY:  Wheezing bilaterally EXTREMITIES: No edema  Assessment and Plan   Atrial fibrillation -New diagnosis January 2025 -Rate controlled, -RRR today -Echo scheduled for March 07, 2023 -Order 2 weeks Zio patch to assess A-fib burden. -Scheduled to see Dr. Jimmey Ralph in March 2025 to discuss watchman implant.  Patient is on long-term NSAIDs for his psoriatic arthritis.   -Given 3 weeks of Eliquis 5 mg twice daily samples.  Given him patient assistance form.   -Decrease alcohol intake recommended, though patient states this is difficult given his chronic pain. -Tobacco cessation recommended.  Patient has wheezing on exam today. -Blood  pressure control recommended, see below.  Hypertension, BP elevated -Renal artery duplex scheduled for March 07, 2023 -Stop olmesartan 20 mg daily.  Start valsartan-HCTZ 160-12.5 mg once daily.  Check BMET in 2 weeks. -Follow-up blood pressure --> can increase valsartan HCTZ to 320-25 mg once daily if needed.  Will start lower dose today given the fact that he had dizziness on Azor and do not want to cause side effects that would deter him from taking medications  regularly. -Goal BP is <130/80.  Recommend DASH diet (high in vegetables, fruits, low-fat dairy products, whole grains, poultry, fish, and nuts and low in sweets, sugar-sweetened beverages, and red meats), salt restriction and increase physical activity.  Elevated coronary calcium score Aortic atherosclerosis -Noted on CT cardiac scoring August 2023; 65th percentile for calcium score -Continue Crestor.  Lipids followed by PCP.  Hyperlipidemia -Continue Crestor 10 mg daily.  Lipids followed by PCP. -Recommend cholesterol lowering diets - Mediterranean diet, DASH diet, vegetarian diet, low-carbohydrate diet and avoidance of trans fats.  Discussed healthier choice substitutes.  Nuts, high-fiber foods, and fiber supplements may also improve lipids.    Obesity -Even a 5-10% weight loss can have cardiovascular benefits.   -Recommend moderate intensity activity for 30 minutes 5 days/week and the DASH diet.  Tobacco use  -Recommend tobacco cessation.  Reviewed physiologic effects of nicotine and the immediate-eventual benefits of quitting including improvement in cough/breathing and reduction in cardiovascular events.  Discussed quitting tips such as removing triggers and getting support from family/friends and Quitline [place]. -At his next appointment, consider ordering screening abdominal aortic aneurysm (AAA) duplex given tobacco history.    Disposition - Follow-up with Dr. Royann Shivers as scheduled in April.   Signed, Cannon Kettle, PA-C  02/26/2023 [facility]

## 2023-02-26 ENCOUNTER — Ambulatory Visit: Payer: Medicare Other | Attending: Physician Assistant | Admitting: Physician Assistant

## 2023-02-26 ENCOUNTER — Encounter: Payer: Self-pay | Admitting: Physician Assistant

## 2023-02-26 ENCOUNTER — Ambulatory Visit (INDEPENDENT_AMBULATORY_CARE_PROVIDER_SITE_OTHER): Payer: Medicare Other

## 2023-02-26 VITALS — BP 154/100 | HR 70 | Ht 67.0 in | Wt 158.0 lb

## 2023-02-26 DIAGNOSIS — I10 Essential (primary) hypertension: Secondary | ICD-10-CM | POA: Diagnosis present

## 2023-02-26 DIAGNOSIS — E7800 Pure hypercholesterolemia, unspecified: Secondary | ICD-10-CM | POA: Insufficient documentation

## 2023-02-26 DIAGNOSIS — I4891 Unspecified atrial fibrillation: Secondary | ICD-10-CM

## 2023-02-26 DIAGNOSIS — R931 Abnormal findings on diagnostic imaging of heart and coronary circulation: Secondary | ICD-10-CM | POA: Diagnosis present

## 2023-02-26 MED ORDER — APIXABAN 5 MG PO TABS: 5.0000 mg | ORAL_TABLET | Freq: Two times a day (BID) | ORAL | Status: DC

## 2023-02-26 MED ORDER — VALSARTAN-HYDROCHLOROTHIAZIDE 160-12.5 MG PO TABS: 1.0000 | ORAL_TABLET | Freq: Every day | ORAL | 3 refills | Status: DC

## 2023-02-26 MED ORDER — APIXABAN 5 MG PO TABS: 5.0000 mg | ORAL_TABLET | Freq: Two times a day (BID) | ORAL | 11 refills | Status: DC

## 2023-02-26 NOTE — Progress Notes (Unsigned)
Enrolled for [employer] to mail a ZIO XT long term holter monitor to the patients address on file.   Dr. Royann Shivers to read.

## 2023-02-26 NOTE — Patient Instructions (Signed)
Medication Instructions:  Stop Olmesartan. Start Valsartan/ hydrochlorothiazide 160/12.5 mg ( Tablet Daily). Restart Eliquis 5 mg ( 1 Tablet Twice Daily). *If you need a refill on your cardiac medications before your next appointment, please call your pharmacy*   Lab Work: BMET : To Be Done In 2 Weeks If you have labs (blood work) drawn today and your tests are completely normal, you will receive your results only by: MyChart Message (if you have MyChart) OR A paper copy in the mail If you have any lab test that is abnormal or we need to change your treatment, we will call you to review the results.   Testing/Procedures: Christena Deem- Long Term Monitor Instructions  Your physician has requested you wear a ZIO patch monitor for 14 days.  This is a single patch monitor. [employer] supplies one patch monitor per enrollment. Additional stickers are not available. Please do not apply patch if you will be having a Nuclear Stress Test,  Echocardiogram, Cardiac CT, MRI, or Chest Xray during the period you would be wearing the  monitor. The patch cannot be worn during these tests. You cannot remove and re-apply the  ZIO XT patch monitor.  Your ZIO patch monitor will be mailed 3 day USPS to your address on file. It may take 3-5 days  to receive your monitor after you have been enrolled.  Once you have received your monitor, please review the enclosed instructions. Your monitor  has already been registered assigning a specific monitor serial # to you.  Billing and Patient Assistance Program Information  We have supplied [employer] with any of your insurance information on file for billing purposes. [employer] offers a sliding scale Patient Assistance Program for patients that do not have  insurance, or whose insurance does not completely cover the cost of the ZIO monitor.  You must apply for the Patient Assistance Program to qualify for this discounted rate.  To apply, please call [employer] at [phone],  select option 4, select option 2, ask to apply for  Patient Assistance Program. Meredeth Ide will ask your household income, and how many people  are in your household. They will quote your out-of-pocket cost based on that information.  [employer] will also be able to set up a 86-month, interest-free payment plan if needed.  Applying the monitor   Shave hair from upper left chest.  Hold abrader disc by orange tab. Rub abrader in 40 strokes over the upper left chest as  indicated in your monitor instructions.  Clean area with 4 enclosed alcohol pads. Let dry.  Apply patch as indicated in monitor instructions. Patch will be placed under collarbone on left  side of chest with arrow pointing upward.  Rub patch adhesive wings for 2 minutes. Remove white label marked "1". Remove the white  label marked "2". Rub patch adhesive wings for 2 additional minutes.  While looking in a mirror, press and release button in center of patch. A small green light will  flash 3-4 times. This will be your only indicator that the monitor has been turned on.  Do not shower for the first 24 hours. You may shower after the first 24 hours.  Press the button if you feel a symptom. You will hear a small click. Record Date, Time and  Symptom in the Patient Logbook.  When you are ready to remove the patch, follow instructions on the last 2 pages of Patient  Logbook. Stick patch monitor onto the last page of Patient Logbook.  Place Patient  Logbook in the blue and white box. Use locking tab on box and tape box closed  securely. The blue and white box has prepaid postage on it. Please place it in the mailbox as  soon as possible. Your physician should have your test results approximately 7 days after the  monitor has been mailed back to [facility].  Call [facility] Customer Care at [phone] if you have questions regarding  your ZIO XT patch monitor. Call them immediately if you see an orange light blinking on your   monitor.  If your monitor falls off in less than 4 days, contact our Monitor department at [phone].  If your monitor becomes loose or falls off after 4 days call [employer] at [phone] for  suggestions on securing your monitor    Follow-Up: At [facility], you and your health needs are our priority.  As part of our continuing mission to provide you with exceptional heart care, we have created designated Provider Care Teams.  These Care Teams include your primary Cardiologist (physician) and Advanced Practice Providers (APPs -  Physician Assistants and Nurse Practitioners) who all work together to provide you with the care you need, when you need it.  We recommend signing up for the patient portal called "MyChart".  Sign up information is provided on this After Visit Summary.  MyChart is used to connect with patients for Virtual Visits (Telemedicine).  Patients are able to view lab/test results, encounter notes, upcoming appointments, etc.  Non-urgent messages can be sent to your provider as well.   To learn more about what you can do with MyChart, go to [email or web address].    Your next appointment:   Keep Scheduled Appointment  Provider:   Thurmon Fair, MD

## 2023-03-06 ENCOUNTER — Other Ambulatory Visit: Payer: Medicare Other

## 2023-03-06 NOTE — Progress Notes (Signed)
03/06/2023 Name: Troy Chapman MRN: [id number] DOB: May 31, 1955  Chief Complaint  Patient presents with   Medication Management   Hypertension    Troy Chapman is a 68 y.o. year old male who presented for a telephone visit.   They were referred to the pharmacist by their PCP for assistance in managing hypertension.    Subjective:  Care Team: Primary Care Provider: Aleen Campi Cleda Mccreedy ;   Medication Access/Adherence  Current Pharmacy:  [facility] [address] [place], [place] - [address] [place] [address] Phone: [phone] Fax: [phone]   Patient reports affordability concerns with their medications: No  Patient reports access/transportation concerns to their pharmacy: No  Patient reports adherence concerns with their medications:  Yes - stopping BP meds due to vertigo issues/dizziness. Self stopped rosuvastatin sometime ago when vertigo flared "just didn't feel like taking it" - restarted 02/03/23   Hypertension:  Current medications: Valsartan-hydrochlorothiazide 160-12.5mg  daily Medications previously tried: Amlodipine/Olmesartan, Benazepril, Lisinopril  Patient has a validated, automated, upper arm home BP cuff Current blood pressure readings: reports still having elevated BP readings since starting new BP med. As high as 170s for SBP  Patient denies hypotensive s/sx including dizziness Patient denies hypertensive symptoms including headache, chest pain, shortness of breath   Objective:  Lab Results  Component Value Date   HGBA1C 5.8 08/22/2022    Lab Results  Component Value Date   CREATININE 0.91 11/06/2020   BUN 9 11/06/2020   NA 141 11/06/2020   K 4.8 11/06/2020   CL 103 11/06/2020   CO2 24 11/06/2020    Lab Results  Component Value Date   CHOL 156 08/15/2021   HDL 75 08/15/2021   LDLCALC 68 08/15/2021   TRIG 69 08/15/2021   CHOLHDL 2.1 08/15/2021    Medications Reviewed Today      Reviewed by Sherrill Raring, RPH (Pharmacist) on 03/06/23 at 1443  Med List Status: <None>   Medication Order Taking? Sig Documenting Provider Last Dose Status Informant  apixaban (ELIQUIS) 5 MG TABS tablet [id number] Yes Take 1 tablet (5 mg total) by mouth 2 (two) times daily. Cannon Kettle, PA-C Taking Active   apixaban (ELIQUIS) 5 MG TABS tablet [id number] Yes Take 1 tablet (5 mg total) by mouth 2 (two) times daily. Cannon Kettle, PA-C Taking Active   Certolizumab Pegol [facility]) [id number]  Inject into the skin. [provider]  Active   diazepam (VALIUM) 5 MG tablet [id number] Yes Take 1 tablet every night Van Clines, MD Taking Active   Fluticasone-Umeclidin-Vilant [facility] ELLIPTA) 200-62.5-25 MCG/ACT AEPB [id number] Yes Inhale 1 puff into the lungs daily. Tysinger, Kermit Balo, PA-C Taking Active   leflunomide (ARAVA) 20 MG tablet [id number] No Take 20 mg by mouth daily.  Patient not taking: Reported on 03/06/2023   [provider] Not Taking Active   meloxicam (MOBIC) 15 MG tablet [id number] Yes Take 1 tablet by mouth once daily Tysinger, Kermit Balo, PA-C Taking Active   predniSONE (DELTASONE) 5 MG tablet [id number] No Take 5 mg by mouth daily with breakfast. 3 tabs daily  Patient not taking: Reported on 03/06/2023   [provider] Not Taking Active Self           Med Note Dione Housekeeper, Kathreen Cosier   Fri Jan 31, 2023  3:50 PM) Patient is down to 1 daily  rosuvastatin (CRESTOR) 10 MG tablet [id number] Yes Take 1 tablet (10 mg total) by mouth  daily. Jac Canavan, PA-C Taking Active Self           Med Note Theodis Sato Dec 26, 2022  3:36 PM) Plans to restart 12/26/22 pm dosing  valsartan-hydrochlorothiazide (DIOVAN HCT) 160-12.5 MG tablet [id number] Yes Take 1 tablet by mouth daily. Cannon Kettle, PA-C Taking Active               Assessment/Plan:   Hypertension: - Currently uncontrolled - Reviewed long term cardiovascular and renal  outcomes of uncontrolled blood pressure - Reviewed appropriate blood pressure monitoring technique and reviewed goal blood pressure. Recommended to check home blood pressure and heart rate daily and keep a log, bring with you to all appts -Talk with cardio tmrw about continued elevated BP readings, may need to increase valsartan/hctz     Hyperlipidemia/ASCVD Risk Reduction: -Continue Rosuvastatin 10mg  at bedtime  Eliquis Cost Concerns: Patient is working on completing assistance forms with cardio. Does not qualify for LIS. Patient may need to switch to Warfarin if he does not qualify.   Follow Up Plan: 04/03/23  Sherrill Raring, PharmD Clinical Pharmacist [phone]

## 2023-03-07 ENCOUNTER — Ambulatory Visit (HOSPITAL_COMMUNITY): Admission: RE | Admit: 2023-03-07 | Payer: Medicare Other | Source: Ambulatory Visit

## 2023-03-07 ENCOUNTER — Ambulatory Visit (HOSPITAL_COMMUNITY)
Admission: RE | Admit: 2023-03-07 | Discharge: 2023-03-07 | Disposition: A | Discharge status: Home / Self Care | Payer: Medicare Other | Source: Ambulatory Visit | Attending: Cardiovascular Disease | Admitting: Cardiovascular Disease

## 2023-03-07 DIAGNOSIS — I701 Atherosclerosis of renal artery: Secondary | ICD-10-CM | POA: Insufficient documentation

## 2023-03-12 DIAGNOSIS — R931 Abnormal findings on diagnostic imaging of heart and coronary circulation: Secondary | ICD-10-CM | POA: Diagnosis not present

## 2023-03-12 DIAGNOSIS — I4891 Unspecified atrial fibrillation: Secondary | ICD-10-CM

## 2023-03-18 ENCOUNTER — Telehealth: Payer: Self-pay | Admitting: *Deleted

## 2023-03-18 NOTE — Progress Notes (Unsigned)
 Electrophysiology Office Note:   Date:  03/20/2023  ID:  CLARKSON ROSSELLI, DOB [birth year], MRN [id number]  Primary Cardiologist: Thurmon Fair, MD Electrophysiologist: Nobie Putnam, MD      History of Present Illness:   Troy Chapman is a 68 y.o. male with h/o vertigo, psoriatic arthritis causing severe swelling in hands and knees, sleep apnea cannot tolerate CPAP, coronary calcification, alcohol use, and atrial fibrillation who is being seen today for evaluation for Watchman device implant at the request of Dr. Royann Shivers.  Discussed the use of AI scribe software for clinical note transcription with the patient, who gave verbal consent to proceed.  History of Present Illness   The patient, with a history of atrial fibrillation and arthritis, is currently on Eliquis and Meloxicam. He reports feeling less fatigued since starting Eliquis, but still experiences low energy levels and weakness, particularly when performing tasks such as brushing his hair. [name] atrial fibrillation symptoms began after an episode of vertigo, which he attributes to his blood pressure medication. He has since stopped taking the blood pressure medication, resulting in high blood pressure readings. The patient has been on Meloxicam for a long time due to arthritis and is concerned about the increased risk of bleeding when combined with Eliquis.     Review of systems complete and found to be negative unless listed in HPI.   EP Information / Studies Reviewed:    EKG is not ordered today. EKG from 01/31/23 reviewed which showed AF.        Risk Assessment/Calculations:    CHA2DS2-VASc Score = 3   This indicates a 3.2% annual risk of stroke. [name] score is based upon: CHF History: 0 HTN History: 1 Diabetes History: 0 Stroke History: 0 Vascular Disease History: 1 Age Score: 1 Gender Score: 0          Physical Exam:   VS:  BP (!) 180/112 (BP Location: Left Arm, Patient Position: Sitting,  Cuff Size: Normal)   Pulse 77   Ht 5\' 7"  (1.702 m)   Wt 158 lb 6.4 oz (71.8 kg)   SpO2 98%   BMI 24.81 kg/m    Wt Readings from Last 3 Encounters:  03/19/23 158 lb 6.4 oz (71.8 kg)  02/26/23 158 lb (71.7 kg)  02/04/23 168 lb (76.2 kg)     GEN: Well nourished, well developed in no acute distress NECK: No JVD CARDIAC: Normal rate, regular rhythm RESPIRATORY:  Clear to auscultation without rales, wheezing or rhonchi  ABDOMEN: Soft, non-distended EXTREMITIES:  No edema; No deformity   ASSESSMENT AND PLAN:   I have seen Elber L Math in the office today who is being considered for a Watchman left atrial appendage closure device. I believe they will benefit from this procedure given their history of atrial fibrillation, CHA2DS2-VASc score of 3 and unadjusted ischemic stroke rate of 3.2% per year. Unfortunately, the patient is not felt to be a long term anticoagulation candidate secondary to chronic NSAID use for psoriatic arthritis. [name] chart has been reviewed and I feel that they would be a candidate for short term oral anticoagulation after Watchman implant.   It is my belief that after undergoing a LAA closure procedure, Challen L Runk will not need long term anticoagulation which eliminates anticoagulation side effects and major bleeding risk.   Procedural risks for the Watchman implant have been reviewed with the patient including a 0.5% risk of stroke, <1% risk of perforation and <1% risk of device embolization. Other  risks include bleeding, vascular damage, tamponade, worsening renal function, and death. The patient understands these risk and wishes to proceed.     The published clinical data on the safety and effectiveness of WATCHMAN include but are not limited to the following: - Holmes DR, Everlene Farrier, Sick P et al. for the PROTECT AF Investigators. Percutaneous closure of the left atrial appendage versus warfarin therapy for prevention of stroke in patients with  atrial fibrillation: a randomised non-inferiority trial. Lancet 2009; 374: 534-42. Everlene Farrier, Doshi SK, Isa Rankin D et al. on behalf of the PROTECT AF Investigators. Percutaneous Left Atrial Appendage Closure for Stroke Prophylaxis in Patients With Atrial Fibrillation 2.3-Year Follow-up of the PROTECT AF (Watchman Left Atrial Appendage System for Embolic Protection in Patients With Atrial Fibrillation) Trial. Circulation 2013; 127:720-729. - Alli O, Doshi S,  Kar S, Reddy VY, Sievert H et al. Quality of Life Assessment in the Randomized PROTECT AF (Percutaneous Closure of the Left Atrial Appendage Versus Warfarin Therapy for Prevention of Stroke in Patients With Atrial Fibrillation) Trial of Patients at Risk for Stroke With Nonvalvular Atrial Fibrillation. J Am Coll Cardiol 2013; 61:1790-8. Aline August DR, Mia Creek, Price M, Whisenant B, Sievert H, Doshi S, Huber K, Reddy V. Prospective randomized evaluation of the Watchman left atrial appendage Device in patients with atrial fibrillation versus long-term warfarin therapy; the PREVAIL trial. Journal of the [employer] of Cardiology, Vol. 4, No. 1, 2014, 1-11. - Kar S, Doshi SK, Sadhu A, Horton R, Osorio J et al. Primary outcome evaluation of a next-generation left atrial appendage closure device: results from the PINNACLE FLX trial. Circulation 2021;143(18)1754-1762.   After today's visit with the patient which was dedicated solely for shared decision making visit regarding LAA closure device, the patient would like to think about his options. If he decides to proceed, then I would like to obtain a gated CT scan of the chest with contrast timed for PV/LA visualization prior to the procedure.   Additionally, the patient has an echocardiogram and Zio monitor pending.  HAS-BLED score 3 Hypertension Yes  Abnormal renal and liver function (Dialysis, transplant, Cr >2.26 mg/dL /Cirrhosis or Bilirubin >2x Normal or AST/ALT/AP >3x Normal) No  Stroke No   Bleeding No  Labile INR (Unstable/high INR) No  Elderly (>65) Yes  Drugs or alcohol (>= 8 drinks/week, anti-plt or NSAID) Yes   CHA2DS2-VASc Score = 3  [name] score is based upon: CHF History: 0 HTN History: 1 Diabetes History: 0 Stroke History: 0 Vascular Disease History: 1 Age Score: 1 Gender Score: 0       ASSESSMENT AND PLAN: Paroxysmal Atrial Fibrillation [name] CHA2DS2-VASc score is 3, indicating a 3.2% annual risk of stroke.  Back in normal rhythm today. He has an echocardiogram and Zio monitor pending.  Secondary Hypercoagulable State (ICD10:  D68.69) The patient is at significant risk for stroke/thromboembolism based upon his CHA2DS2-VASc Score of 3.  Continue Apixaban (Eliquis). If unable to afford then we need to transition to warfarin.   Psoriatic Arthritis:  Chronic meloxicam use. Instructed not to take NSAIDs while on Eliquis due to increased risk of bleeding. If no Watchman then will need to discuss with rheumatologist an alternative to meloxicam.  Hypertension: Above goal today. Asymptomatic. Patient had self-discontinued his valsartan-hydrochlorothiazide. I instructed him to resume this medication. If he has problems with this medication then to contact PCP for an alternative.  Recommend checking blood pressures 1-2 times per week at home and recording the  values.  Recommend bringing these recordings to the primary care physician.   Total time of encounter: 65 minutes total time of encounter, including chart review, face-to-face patient care, coordination of care and counseling regarding high complexity medical decision making.   Signed, Nobie Putnam, MD

## 2023-03-18 NOTE — Telephone Encounter (Signed)
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  This can cause high blood pressure that is difficult to control or is fairly erratic. Of the best choice is to treat this because of high blood pressure is an angiotensin receptor blocker such as the valsartan that is included in his antihypertensive medication.  How is his blood pressure running now? 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  This is usually not an important abnormality, since there is collateral flow from the superior mesenteric artery, which appears to be wide open.  No specific intervention is necessary for this. 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

## 2023-03-18 NOTE — Progress Notes (Incomplete)
 Electrophysiology Office Note:   Date:  03/18/2023  ID:  Troy Chapman, DOB August 13, 1955, MRN [id number]  Primary Cardiologist: Thurmon Fair, MD Electrophysiologist: Nobie Putnam, MD  {Click to update primary MD,subspecialty MD or APP then REFRESH:1}    History of Present Illness:   Troy Chapman is a 68 y.o. male with h/o vertigo, psoriatic arthritis causing severe swelling in hands and knees, sleep apnea cannot tolerate CPAP, coronary calcification, alcohol use, and atrial fibrillation who is being seen today for evaluation for Watchman device implant at the request of Dr. Royann Shivers.  Discussed the use of AI scribe software for clinical note transcription with the patient, who gave verbal consent to proceed.  History of Present Illness            Review of systems complete and found to be negative unless listed in HPI.   EP Information / Studies Reviewed:    EKG is not ordered today. EKG from 01/31/23 reviewed which showed AF.        Risk Assessment/Calculations:    CHA2DS2-VASc Score = 3  {Confirm score is correct.  If not, click here to update score.  REFRESH note.  :1} This indicates a 3.2% annual risk of stroke. [name] score is based upon: CHF History: 0 HTN History: 1 Diabetes History: 0 Stroke History: 0 Vascular Disease History: 1 Age Score: 1 Gender Score: 0   {This patient has a significant risk of stroke if diagnosed with atrial fibrillation.  Please consider VKA or DOAC agent for anticoagulation if the bleeding risk is acceptable.   You can also use the SmartPhrase .[email or web address] for documentation.   :[id number]} No BP recorded.  {Refresh Note OR Click here to enter BP  :1}***        Physical Exam:   VS:  There were no vitals taken for this visit.   Wt Readings from Last 3 Encounters:  02/26/23 158 lb (71.7 kg)  02/04/23 168 lb (76.2 kg)  01/31/23 168 lb 9.6 oz (76.5 kg)     GEN: Well nourished, well developed in no acute distress NECK:  No JVD CARDIAC: {EPRHYTHM:[id number]}, no murmurs, rubs, gallops RESPIRATORY:  Clear to auscultation without rales, wheezing or rhonchi  ABDOMEN: Soft, non-distended EXTREMITIES:  No edema; No deformity   ASSESSMENT AND PLAN:   I have seen Troy Chapman in the office today who is being considered for a Watchman left atrial appendage closure device. I believe they will benefit from this procedure given their history of atrial fibrillation, CHA2DS2-VASc score of 3 and unadjusted ischemic stroke rate of 3.2% per year. Unfortunately, the patient is not felt to be a long term anticoagulation candidate secondary to chronic NSAID use for psoriatic arthritis. [name] chart has been reviewed and I feel that they would be a candidate for short term oral anticoagulation after Watchman implant.   It is my belief that after undergoing a LAA closure procedure, Troy Chapman will not need long term anticoagulation which eliminates anticoagulation side effects and major bleeding risk.   Procedural risks for the Watchman implant have been reviewed with the patient including a 0.5% risk of stroke, <1% risk of perforation and <1% risk of device embolization. Other risks include bleeding, vascular damage, tamponade, worsening renal function, and death. The patient understands these risk and wishes to proceed.     The published clinical data on the safety and effectiveness of WATCHMAN include but are not limited to the following: - Aline August DR, Everlene Farrier, Sick  P et al. for the PROTECT AF Investigators. Percutaneous closure of the left atrial appendage versus warfarin therapy for prevention of stroke in patients with atrial fibrillation: a randomised non-inferiority trial. Lancet 2009; 374: 534-42. Everlene Farrier, Doshi SK, Isa Rankin D et al. on behalf of the PROTECT AF Investigators. Percutaneous Left Atrial Appendage Closure for Stroke Prophylaxis in Patients With Atrial Fibrillation 2.3-Year Follow-up of the  PROTECT AF (Watchman Left Atrial Appendage System for Embolic Protection in Patients With Atrial Fibrillation) Trial. Circulation 2013; 127:720-729. - Alli O, Doshi S,  Kar S, Reddy VY, Sievert H et al. Quality of Life Assessment in the Randomized PROTECT AF (Percutaneous Closure of the Left Atrial Appendage Versus Warfarin Therapy for Prevention of Stroke in Patients With Atrial Fibrillation) Trial of Patients at Risk for Stroke With Nonvalvular Atrial Fibrillation. J Am Coll Cardiol 2013; 61:1790-8. Aline August DR, Mia Creek, Price M, Whisenant B, Sievert H, Doshi S, Huber K, Reddy V. Prospective randomized evaluation of the Watchman left atrial appendage Device in patients with atrial fibrillation versus long-term warfarin therapy; the PREVAIL trial. Journal of the [employer] of Cardiology, Vol. 4, No. 1, 2014, 1-11. - Kar S, Doshi SK, Sadhu A, Horton R, Osorio J et al. Primary outcome evaluation of a next-generation left atrial appendage closure device: results from the PINNACLE FLX trial. Circulation 2021;143(18)1754-1762.    After today's visit with the patient which was dedicated solely for shared decision making visit regarding LAA closure device, the patient decided to proceed with the LAA appendage closure procedure scheduled to be done in the near future at [facility]. Prior to the procedure, I would like to obtain a gated CT scan of the chest with contrast timed for PV/LA visualization.   Additionally, the patient has an echocardiogram and Zio monitor pending.  HAS-BLED score 3 Hypertension Yes  Abnormal renal and liver function (Dialysis, transplant, Cr >2.26 mg/dL /Cirrhosis or Bilirubin >2x Normal or AST/ALT/AP >3x Normal) No  Stroke No  Bleeding No  Labile INR (Unstable/high INR) No  Elderly (>66) Yes  Drugs or alcohol (>= 8 drinks/week, anti-plt or NSAID) Yes   CHA2DS2-VASc Score = 3  [name] score is based upon: CHF History: 0 HTN History: 1 Diabetes History:  0 Stroke History: 0 Vascular Disease History: 1 Age Score: 1 Gender Score: 0   {Confirm score is correct.  If not, click here to update score.  REFRESH note.  :1}    ASSESSMENT AND PLAN: Persistent Atrial Fibrillation (ICD10:  I48.19) [name] CHA2DS2-VASc score is 3, indicating a 3.2% annual risk of stroke.  ***  Secondary Hypercoagulable State (ICD10:  D68.69){Click to add to Prob List or Visit Dx  :[id number]} The patient is at significant risk for stroke/thromboembolism based upon his CHA2DS2-VASc Score of 3.  Continue Apixaban (Eliquis).    Signed, Nobie Putnam, MD

## 2023-03-18 NOTE — Telephone Encounter (Signed)
 Left message for pt to call.

## 2023-03-19 ENCOUNTER — Encounter: Payer: Self-pay | Admitting: Cardiology

## 2023-03-19 ENCOUNTER — Ambulatory Visit: Payer: No Typology Code available for payment source | Attending: Cardiology | Admitting: Cardiology

## 2023-03-19 VITALS — BP 180/112 | HR 77 | Ht 67.0 in | Wt 158.4 lb

## 2023-03-19 DIAGNOSIS — D6869 Other thrombophilia: Secondary | ICD-10-CM | POA: Diagnosis present

## 2023-03-19 DIAGNOSIS — I480 Paroxysmal atrial fibrillation: Secondary | ICD-10-CM | POA: Diagnosis present

## 2023-03-19 DIAGNOSIS — I10 Essential (primary) hypertension: Secondary | ICD-10-CM | POA: Diagnosis present

## 2023-03-19 DIAGNOSIS — L4050 Arthropathic psoriasis, unspecified: Secondary | ICD-10-CM | POA: Diagnosis present

## 2023-03-19 MED ORDER — VALSARTAN 160 MG PO TABS
160.0000 mg | ORAL_TABLET | Freq: Every day | ORAL | 3 refills | Status: DC
Start: 2023-03-19 — End: 2023-05-13

## 2023-03-19 MED ORDER — HYDROCHLOROTHIAZIDE 12.5 MG PO CAPS: 12.5000 mg | ORAL_CAPSULE | Freq: Every day | ORAL | 3 refills | Status: DC

## 2023-03-19 NOTE — Telephone Encounter (Signed)
 Spoke with pt, aware of results and recommendations. The patient reports he has never started taking the valsartan -hydrochlorothiazide, he reports the cost was too high. Patient agreed top start the medications if I sent them in separately and then he would be able to afford them. New script sent to the pharmacy. After starting the blood pressure medications, he will keep a log of his blood pressure and let us know how it is running. Will make dr croitoru aware.

## 2023-03-19 NOTE — Patient Instructions (Signed)
 Medication Instructions:  Your physician recommends that you continue on your current medications as directed. Please refer to the Current Medication list given to you today.  *If you need a refill on your cardiac medications before your next appointment, please call your pharmacy*  Tests/Procedures: Watchman Your physician has requested that you have Left atrial appendage (LAA) closure device implantation is a procedure to put a small device in the LAA of the heart. The LAA is a small sac in the wall of the heart's left upper chamber. Blood clots can form in this area. The device, Watchman closes the LAA to help prevent a blood clot and stroke.   Follow-Up: At [facility], you and your health needs are our priority.  As part of our continuing mission to provide you with exceptional heart care, we have created designated Provider Care Teams.  These Care Teams include your primary Cardiologist (physician) and Advanced Practice Providers (APPs -  Physician Assistants and Nurse Practitioners) who all work together to provide you with the care you need, when you need it.  Your next appointment:    Please contact Nurse Navigator, Orpha Bur, if you would like to proceed with Watchman implant at [phone]

## 2023-03-19 NOTE — Telephone Encounter (Signed)
 Patient is returning call and is requesting call back.

## 2023-03-20 ENCOUNTER — Telehealth: Payer: Self-pay

## 2023-03-20 DIAGNOSIS — I480 Paroxysmal atrial fibrillation: Secondary | ICD-10-CM

## 2023-03-20 NOTE — Telephone Encounter (Signed)
 The patient reported he would like to proceed with LAAO testing. Scheduled pre-LAAO CT 04/01/2023. He was grateful for call and agreed with plan.

## 2023-03-21 ENCOUNTER — Other Ambulatory Visit: Payer: Self-pay | Admitting: Neurology

## 2023-03-21 LAB — LAB REPORT - SCANNED
EGFR: 60
HM Hepatitis Screen: NEGATIVE

## 2023-03-24 NOTE — Telephone Encounter (Signed)
 Pls check if patient is still taking this medication, if he is, will need f/u for continued refills. He can also get it from his PCP if he wishes. Thanks

## 2023-03-25 ENCOUNTER — Ambulatory Visit (HOSPITAL_BASED_OUTPATIENT_CLINIC_OR_DEPARTMENT_OTHER)
Admission: RE | Admit: 2023-03-25 | Discharge: 2023-03-25 | Disposition: A | Discharge status: Home / Self Care | Payer: No Typology Code available for payment source | Source: Ambulatory Visit | Attending: Cardiovascular Disease | Admitting: Cardiovascular Disease

## 2023-03-25 ENCOUNTER — Encounter: Payer: Self-pay | Admitting: Cardiovascular Disease

## 2023-03-25 DIAGNOSIS — I4891 Unspecified atrial fibrillation: Secondary | ICD-10-CM | POA: Insufficient documentation

## 2023-03-25 LAB — ECHOCARDIOGRAM COMPLETE
AR max vel: 2.62 cm2
AV Area VTI: 2.65 cm2
AV Area mean vel: 2.67 cm2
AV Mean grad: 6 mmHg
AV Peak grad: 11 mmHg
Ao pk vel: 1.66 m/s
Area-P 1/2: 2.22 cm2
Calc EF: 67.3 %
S' Lateral: 2.3 cm
Single Plane A2C EF: 67.6 %
Single Plane A4C EF: 62.4 %

## 2023-03-31 ENCOUNTER — Telehealth: Payer: Self-pay | Admitting: Neurology

## 2023-03-31 ENCOUNTER — Telehealth (HOSPITAL_COMMUNITY): Payer: Self-pay | Admitting: *Deleted

## 2023-03-31 NOTE — Telephone Encounter (Signed)
 Attempted to call patient regarding upcoming cardiac CT appointment. Left message on voicemail with name and callback number  Larey Brick RN Navigator Cardiac Imaging [facility] [phone] Office [phone] Cell

## 2023-03-31 NOTE — Telephone Encounter (Signed)
 Pharmacy called an given DX code

## 2023-03-31 NOTE — Telephone Encounter (Signed)
 Joselyn Glassman needs a call back to get the DX code for this medication. The pt has waited all weekend, he would like a call back asap

## 2023-04-01 ENCOUNTER — Ambulatory Visit (HOSPITAL_COMMUNITY)
Admission: RE | Admit: 2023-04-01 | Discharge: 2023-04-01 | Disposition: A | Discharge status: Home / Self Care | Payer: PRIVATE HEALTH INSURANCE | Source: Ambulatory Visit | Attending: Medical | Admitting: Medical

## 2023-04-01 DIAGNOSIS — I480 Paroxysmal atrial fibrillation: Secondary | ICD-10-CM | POA: Insufficient documentation

## 2023-04-01 MED ORDER — IOHEXOL 350 MG/ML SOLN
100.0000 mL | Freq: Once | INTRAVENOUS | Status: AC | PRN
  Administered 2023-04-01: 100 mL via INTRAVENOUS

## 2023-04-03 ENCOUNTER — Telehealth: Payer: Self-pay

## 2023-04-03 ENCOUNTER — Other Ambulatory Visit: Payer: Medicare Other

## 2023-04-03 VITALS — BP 114/84

## 2023-04-03 DIAGNOSIS — I10 Essential (primary) hypertension: Secondary | ICD-10-CM

## 2023-04-03 NOTE — Progress Notes (Signed)
 04/03/2023 Name: Troy Chapman MRN: [id number] DOB: [birth year]  Chief Complaint  Patient presents with   Medication Management   Hypertension    KENNEY GOING is a 68 y.o. year old male who presented for a telephone visit.   They were referred to the pharmacist by their PCP for assistance in managing hypertension.    Subjective:  Care Team: Primary Care Provider: Genia Del ;   Medication Access/Adherence  Current Pharmacy:  [facility] [address] [place], [place] - [address] [place] [address] Phone: [phone] Fax: [phone]   Patient reports affordability concerns with their medications: No  Patient reports access/transportation concerns to their pharmacy: No  Patient reports adherence concerns with their medications:  No   Hypertension:  Current medications: Valsartan 160mg , hydrochlorothiazide 12.5mg  daily Medications previously tried: Amlodipine/Olmesartan, Benazepril, Lisinopril  Patient has a validated, automated, upper arm home BP cuff Current blood pressure readings: reports numbers have improved significantly since starting stand alone valsartan and hydrochlorothiazide. 114/84 most recent reading from yesterday. Checks in evenings so has not checked yet today  Patient denies hypotensive s/sx including dizziness Patient denies hypertensive symptoms including headache, chest pain, shortness of breath   Objective:  Lab Results  Component Value Date   HGBA1C 5.8 08/22/2022    Lab Results  Component Value Date   CREATININE 0.91 11/06/2020   BUN 9 11/06/2020   NA 141 11/06/2020   K 4.8 11/06/2020   CL 103 11/06/2020   CO2 24 11/06/2020    Lab Results  Component Value Date   CHOL 156 08/15/2021   HDL 75 08/15/2021   LDLCALC 68 08/15/2021   TRIG 69 08/15/2021   CHOLHDL 2.1 08/15/2021    Medications Reviewed Today     Reviewed by Sherrill Raring, RPH (Pharmacist) on 04/03/23  at 1440  Med List Status: <None>   Medication Order Taking? Sig Documenting Provider Last Dose Status Informant  apixaban (ELIQUIS) 5 MG TABS tablet [id number]  Take 1 tablet (5 mg total) by mouth 2 (two) times daily.  Patient not taking: Reported on 03/19/2023   Cannon Kettle, PA-C  Active   apixaban (ELIQUIS) 5 MG TABS tablet [id number] Yes Take 1 tablet (5 mg total) by mouth 2 (two) times daily. Cannon Kettle, PA-C Taking Active   Certolizumab Pegol [facility]) [id number]  Inject into the skin. [provider]  Active   diazepam (VALIUM) 5 MG tablet [id number]  TAKE 1 TABLET BY MOUTH ONCE DAILY AT Patricia Nettle, MD  Active   Fluticasone-Umeclidin-Vilant (TRELEGY ELLIPTA) 200-62.5-25 MCG/ACT AEPB [id number]  Inhale 1 puff into the lungs daily. Tysinger, Kermit Balo, PA-C  Active   hydrochlorothiazide (MICROZIDE) 12.5 MG capsule [id number] Yes Take 1 capsule (12.5 mg total) by mouth daily. Croitoru, Mihai, MD Taking Active   leflunomide (ARAVA) 20 MG tablet [id number]  Take 20 mg by mouth daily.  Patient not taking: Reported on 03/19/2023   [provider]  Active   meloxicam (MOBIC) 15 MG tablet [id number]  Take 1 tablet by mouth once daily Tysinger, Kermit Balo, PA-C  Active   predniSONE (DELTASONE) 5 MG tablet [id number]  Take 5 mg by mouth daily with breakfast. 3 tabs daily  Patient not taking: Reported on 03/06/2023   [provider]  Active Self           Med Note Dione Housekeeper, Kathreen Cosier   Fri Jan 31, 2023  3:50 PM) Patient  is down to 1 daily  rosuvastatin (CRESTOR) 10 MG tablet [id number] Yes Take 1 tablet (10 mg total) by mouth daily. Jac Canavan, PA-C Taking Active Self           Med Note Sherrill Raring   Thu Dec 26, 2022  3:36 PM) Plans to restart 12/26/22 pm dosing  valsartan (DIOVAN) 160 MG tablet [id number] Yes Take 1 tablet (160 mg total) by mouth daily. Croitoru, Mihai, MD Taking Active               Assessment/Plan:   Hypertension: -  Currently controlled - Reviewed long term cardiovascular and renal outcomes of uncontrolled blood pressure - Reviewed appropriate blood pressure monitoring technique and reviewed goal blood pressure. Recommended to check home blood pressure and heart rate daily and keep a log, bring with you to all appts -Continue current medication therapy     Hyperlipidemia/ASCVD Risk Reduction: -Continue Rosuvastatin 10mg  at bedtime   Has appt scheduled to consider ablation for afib. Pt anticipates that could be scheduled April/May, will follow up late May to assess if completed and if any med changes, how patient is doing   Follow Up Plan: 06/05/23  Sherrill Raring, PharmD Clinical Pharmacist [phone]

## 2023-04-03 NOTE — Telephone Encounter (Signed)
-----   Message from Nobie Putnam sent at 04/02/2023  8:52 PM EDT ----- Unfortunately, after our last discussion I reviewed his CT results and Watchman is off the table due to anatomy - accessory appendage. We can still offer him an ablation with loop recorder implant and if he has no recurrence then stop anti-coagulation.   Josh ----- Message ----- From: Interface, Rad Results In Sent: 04/01/2023  12:12 PM EDT To: Nobie Putnam, MD

## 2023-04-03 NOTE — Telephone Encounter (Signed)
 After the patient had reassuring echo results, Dr. Jimmey Ralph and Dr. Royann Shivers discussed potential concomitant PVI/LAAO.  However, the patient's cardiac CT showed unfavorable anatomy for Watchman.  Called the patient to discuss results and offer ablation and loop recorder implant.  Left message to call back.

## 2023-04-03 NOTE — Telephone Encounter (Signed)
 Spoke with the patient at length. He understood that while he is not a candidate for Watchman, Dr. Jimmey Ralph offered PVI and loop recorder, then if no afib he can stop a/c after that.   The patient agreed to plan. Scheduled him for follow-up on 3/31 to discuss PVI/loop as the patient would like a more detailed discussion with Dr. Jimmey Ralph prior to proceeding. He was grateful for call and agreed with plan.   While on the phone, he reported he mailed back his monitor over a week ago and has not received results. Will send to monitor tech for follow-up.

## 2023-04-04 ENCOUNTER — Other Ambulatory Visit: Payer: Self-pay | Admitting: Medical

## 2023-04-14 ENCOUNTER — Ambulatory Visit: Payer: PRIVATE HEALTH INSURANCE | Attending: Cardiology | Admitting: Cardiology

## 2023-04-14 ENCOUNTER — Other Ambulatory Visit: Payer: Self-pay

## 2023-04-14 ENCOUNTER — Encounter: Payer: Self-pay | Admitting: Cardiology

## 2023-04-14 VITALS — BP 110/62 | HR 69 | Ht 68.0 in | Wt 154.0 lb

## 2023-04-14 DIAGNOSIS — L4050 Arthropathic psoriasis, unspecified: Secondary | ICD-10-CM | POA: Diagnosis present

## 2023-04-14 DIAGNOSIS — I10 Essential (primary) hypertension: Secondary | ICD-10-CM

## 2023-04-14 DIAGNOSIS — I480 Paroxysmal atrial fibrillation: Secondary | ICD-10-CM

## 2023-04-14 DIAGNOSIS — D6869 Other thrombophilia: Secondary | ICD-10-CM

## 2023-04-14 NOTE — Progress Notes (Signed)
 Electrophysiology Office Note:   Date:  04/14/2023  ID:  Troy Chapman, DOB Dec 08, 1955, MRN [id number]  Primary Cardiologist: Thurmon Fair, MD Electrophysiologist: Nobie Putnam, MD      History of Present Illness:   Troy Chapman is a 68 y.o. male with h/o vertigo, psoriatic arthritis causing severe swelling in hands and knees, sleep apnea cannot tolerate CPAP, coronary calcification, alcohol use, and atrial fibrillation who is seen today for follow-up evaluation.  Discussed the use of AI scribe software for clinical note transcription with the patient, who gave verbal consent to proceed.  History of Present Illness Since our last visit, patient had a CT scan to assess candidacy for Watchman device implant and he was found to have an accessory lobe of his appendage that would not be amenable to Watchman device closure.  He has returned today to discuss catheter ablation for atrial fibrillation and loop recorder implantation. He experiences intermittent episodes of atrial fibrillation, with a recent episode seated with rapid heart rate and a sensation of being 'plastered to the desk chair,' lasting at least 20 minutes and accompanied by dizziness and immobility. A subsequent episode occurred the following night but was less severe.He has undergone cardioversion and medication adjustments in the past, which have intermittently restored normal rhythm. He is currently on Eliquis but mains interested in options to discontinue its use due to his need for NSAIDs in setting of psoriatic arthritis.   Review of systems complete and found to be negative unless listed in HPI.   EP Information / Studies Reviewed:    EKG is not ordered today. EKG from 01/31/23 reviewed which showed AF.        Risk Assessment/Calculations:    CHA2DS2-VASc Score = 3   This indicates a 3.2% annual risk of stroke. [name] score is based upon: CHF History: 0 HTN History: 1 Diabetes History: 0 Stroke  History: 0 Vascular Disease History: 1 Age Score: 1 Gender Score: 0          Physical Exam:   VS:  BP 110/62   Pulse 69   Ht 5\' 8"  (1.727 m)   Wt 154 lb (69.9 kg)   SpO2 96%   BMI 23.42 kg/m    Wt Readings from Last 3 Encounters:  04/14/23 154 lb (69.9 kg)  03/19/23 158 lb 6.4 oz (71.8 kg)  02/26/23 158 lb (71.7 kg)     GEN: Well nourished, well developed in no acute distress NECK: No JVD CARDIAC: Normal rate, regular rhythm RESPIRATORY:  Clear to auscultation without rales, wheezing or rhonchi  ABDOMEN: Soft, non-distended EXTREMITIES:  No edema; No deformity   ASSESSMENT AND PLAN:    Paroxysmal Atrial Fibrillation: Symptomatic. -Discussed treatment options today for AF including antiarrhythmic drug therapy and ablation. Discussed risks, recovery and likelihood of success with each treatment strategy. Risk, benefits, and alternatives to EP study and ablation for afib were discussed. These risks include but are not limited to stroke, bleeding, vascular damage, tamponade, perforation, damage to the esophagus, lungs, phrenic nerve and other structures, pulmonary vein stenosis, worsening renal function, coronary vasospasm and death.  Discussed potential need for repeat ablation procedures and antiarrhythmic drugs after an initial ablation. The patient understands these risk and wishes to proceed.  We will therefore proceed with catheter ablation at the next available time.  Carto, ICE, anesthesia are requested for the procedure.  Will also obtain CT PV protocol prior to the procedure to exclude LAA thrombus and further evaluate atrial anatomy.  Will also implant loop recorder at time of ablation.  Secondary Hypercoagulable State: Patient has a CHA2DS2-VASc score of 3.  He would like to come off his oral anticoagulation due to need for chronic NSAID use in the setting of psoriatic arthritis.  Unfortunately, patient is not a candidate for Watchman device implant due to presence of an  accessory lobe of his left atrial appendage.  His best option for discontinuation of oral anticoagulation would be to perform catheter ablation for atrial fibrillation and implantation of a loop recorder device.  If he has no atrial fibrillation on his loop recorder then we can discontinue anticoagulation and continue with close monitoring.  I have previously discussed this plan with his primary cardiologist Dr. Royann Shivers.  -Continue Eliquis for now.  Patient has some concerns over cost.  We will try to get him through the 71-month period that would be required after ablation.  If not, then he will need to transition to warfarin.  I will have him speak with our cardiology pharmacy for assistance.  Psoriatic Arthritis:  Chronic meloxicam use. Instructed not to take NSAIDs while on Eliquis due to increased risk of bleeding.  He is on leflunomide.  Hypertension: At goal today.  Now back on valsartan and hydrochlorothiazide. Recommend checking blood pressures 1-2 times per week at home and recording the values.  Recommend bringing these recordings to the primary care physician.   Signed, Nobie Putnam, MD

## 2023-04-14 NOTE — Patient Instructions (Signed)
 Medication Instructions:  Your physician recommends that you continue on your current medications as directed. Please refer to the Current Medication list given to you today.  *If you need a refill on your cardiac medications before your next appointment, please call your pharmacy*  Lab Work: BMET and CBC - please go to any [facility] location to have these drawn prior to your procedure  Testing/Procedures: Ablation Your physician has recommended that you have an ablation. Catheter ablation is a medical procedure used to treat some cardiac arrhythmias (irregular heartbeats). During catheter ablation, a long, thin, flexible tube is put into a blood vessel in your groin (upper thigh), or neck. This tube is called an ablation catheter. It is then guided to your heart through the blood vessel. Radio frequency waves destroy small areas of heart tissue where abnormal heartbeats may cause an arrhythmia to start. Please see the instruction sheet given to you today.  Follow-Up: At [facility], you and your health needs are our priority.  As part of our continuing mission to provide you with exceptional heart care, our providers are all part of one team.  This team includes your primary Cardiologist (physician) and Advanced Practice Providers or APPs (Physician Assistants and Nurse Practitioners) who all work together to provide you with the care you need, when you need it.  Your next appointment:   We will call you to arrange your post-procedure appointments       1st Floor: - Lobby - Registration  - Pharmacy  - Lab - Cafe  2nd Floor: - PV Lab - Diagnostic Testing (echo, CT, nuclear med)  3rd Floor: - Vacant  4th Floor: - TCTS (cardiothoracic surgery) - AFib Clinic - Structural Heart Clinic - Vascular Surgery  - Vascular Ultrasound  5th Floor: - HeartCare Cardiology (general and EP) - Clinical Pharmacy for coumadin, hypertension, lipid, weight-loss medications, and med  management appointments    Valet parking services will be available as well.

## 2023-04-14 NOTE — H&P (View-Only) (Signed)
 Electrophysiology Office Note:   Date:  04/14/2023  ID:  MARTIN BELLING, DOB Dec 08, 1955, MRN [id number]  Primary Cardiologist: Thurmon Fair, MD Electrophysiologist: Nobie Putnam, MD      History of Present Illness:   Troy Chapman is a 68 y.o. male with h/o vertigo, psoriatic arthritis causing severe swelling in hands and knees, sleep apnea cannot tolerate CPAP, coronary calcification, alcohol use, and atrial fibrillation who is seen today for follow-up evaluation.  Discussed the use of AI scribe software for clinical note transcription with the patient, who gave verbal consent to proceed.  History of Present Illness Since our last visit, patient had a CT scan to assess candidacy for Watchman device implant and he was found to have an accessory lobe of his appendage that would not be amenable to Watchman device closure.  He has returned today to discuss catheter ablation for atrial fibrillation and loop recorder implantation. He experiences intermittent episodes of atrial fibrillation, with a recent episode seated with rapid heart rate and a sensation of being 'plastered to the desk chair,' lasting at least 20 minutes and accompanied by dizziness and immobility. A subsequent episode occurred the following night but was less severe.He has undergone cardioversion and medication adjustments in the past, which have intermittently restored normal rhythm. He is currently on Eliquis but mains interested in options to discontinue its use due to his need for NSAIDs in setting of psoriatic arthritis.   Review of systems complete and found to be negative unless listed in HPI.   EP Information / Studies Reviewed:    EKG is not ordered today. EKG from 01/31/23 reviewed which showed AF.        Risk Assessment/Calculations:    CHA2DS2-VASc Score = 3   This indicates a 3.2% annual risk of stroke. [name] score is based upon: CHF History: 0 HTN History: 1 Diabetes History: 0 Stroke  History: 0 Vascular Disease History: 1 Age Score: 1 Gender Score: 0          Physical Exam:   VS:  BP 110/62   Pulse 69   Ht 5\' 8"  (1.727 m)   Wt 154 lb (69.9 kg)   SpO2 96%   BMI 23.42 kg/m    Wt Readings from Last 3 Encounters:  04/14/23 154 lb (69.9 kg)  03/19/23 158 lb 6.4 oz (71.8 kg)  02/26/23 158 lb (71.7 kg)     GEN: Well nourished, well developed in no acute distress NECK: No JVD CARDIAC: Normal rate, regular rhythm RESPIRATORY:  Clear to auscultation without rales, wheezing or rhonchi  ABDOMEN: Soft, non-distended EXTREMITIES:  No edema; No deformity   ASSESSMENT AND PLAN:    Paroxysmal Atrial Fibrillation: Symptomatic. -Discussed treatment options today for AF including antiarrhythmic drug therapy and ablation. Discussed risks, recovery and likelihood of success with each treatment strategy. Risk, benefits, and alternatives to EP study and ablation for afib were discussed. These risks include but are not limited to stroke, bleeding, vascular damage, tamponade, perforation, damage to the esophagus, lungs, phrenic nerve and other structures, pulmonary vein stenosis, worsening renal function, coronary vasospasm and death.  Discussed potential need for repeat ablation procedures and antiarrhythmic drugs after an initial ablation. The patient understands these risk and wishes to proceed.  We will therefore proceed with catheter ablation at the next available time.  Carto, ICE, anesthesia are requested for the procedure.  Will also obtain CT PV protocol prior to the procedure to exclude LAA thrombus and further evaluate atrial anatomy.  Will also implant loop recorder at time of ablation.  Secondary Hypercoagulable State: Patient has a CHA2DS2-VASc score of 3.  He would like to come off his oral anticoagulation due to need for chronic NSAID use in the setting of psoriatic arthritis.  Unfortunately, patient is not a candidate for Watchman device implant due to presence of an  accessory lobe of his left atrial appendage.  His best option for discontinuation of oral anticoagulation would be to perform catheter ablation for atrial fibrillation and implantation of a loop recorder device.  If he has no atrial fibrillation on his loop recorder then we can discontinue anticoagulation and continue with close monitoring.  I have previously discussed this plan with his primary cardiologist Dr. Royann Shivers.  -Continue Eliquis for now.  Patient has some concerns over cost.  We will try to get him through the 71-month period that would be required after ablation.  If not, then he will need to transition to warfarin.  I will have him speak with our cardiology pharmacy for assistance.  Psoriatic Arthritis:  Chronic meloxicam use. Instructed not to take NSAIDs while on Eliquis due to increased risk of bleeding.  He is on leflunomide.  Hypertension: At goal today.  Now back on valsartan and hydrochlorothiazide. Recommend checking blood pressures 1-2 times per week at home and recording the values.  Recommend bringing these recordings to the primary care physician.   Signed, Nobie Putnam, MD

## 2023-04-15 ENCOUNTER — Telehealth: Payer: Self-pay

## 2023-04-15 ENCOUNTER — Telehealth: Payer: Self-pay | Admitting: Pharmacist

## 2023-04-15 ENCOUNTER — Other Ambulatory Visit (HOSPITAL_COMMUNITY): Payer: Self-pay

## 2023-04-15 NOTE — Telephone Encounter (Signed)
 Pharmacy Patient Advocate Encounter   Received notification from Physician's Office that TEST CLAIMS for Sondra Barges AND PRADAXA required/requested.   Insurance verification completed.   The patient is insured through  [facility] [employer]  .   Xarelto 10 mg 30 days is $534.66; Eliquis 5 mg 30 days is $542.02; Pradaxa non form on plan. Patient has deductible to meet. Unlikely to have spent enough to qualify yet for PAP. Advised to use medicare payment plan option.

## 2023-04-15 NOTE — Telephone Encounter (Signed)
 Secure chat with Susa Day and nursing team. Patient doesn't need a PA. He has a high cost due to deductible. Nursing is discussing next steps with provider.

## 2023-04-15 NOTE — Telephone Encounter (Signed)
-----   Message from Nurse Kinnie Feil C sent at 04/15/2023  7:24 AM EDT ----- Regarding: FW: Referral to our pharmacy for DOAC cost Can you please advise on request per Dr. Jimmey Ralph? Patient is having trouble affording his Eliquis. He is scheduled for an ablation on 4/28.  Thanks! Carly ----- Message ----- From: Nobie Putnam, MD Sent: 04/14/2023  10:15 PM EDT To: Frutoso Schatz, RN Subject: Referral to our pharmacy for DOAC cost         Spoke briefly about this yesterday, but I do not know the best way to handle this -whether or not he needs a formal referral for visit with our pharmacy to discuss options regarding anticoagulation.  He thinks he will run out of his Eliquis and will need begin of supply to get him through the 90-day period post ablation.  If we do not have vouchers or program for this then he will need to be transition to warfarin.  Thanks, [employer]

## 2023-04-16 ENCOUNTER — Telehealth (HOSPITAL_COMMUNITY): Payer: Self-pay

## 2023-04-16 ENCOUNTER — Telehealth: Payer: Self-pay

## 2023-04-16 NOTE — Telephone Encounter (Addendum)
 Called patient regarding results. Left detailed message for patient regarding results.----- Message from Cannon Kettle sent at 04/08/2023  8:46 PM EDT ----- Normal sinus rhythm with average heart rate 72 2% PAC burden Nonsustained atrial tachycardia (detected with symptomatic patient event) Nocturnal episodes second-degree AV block Mobitz type I Single, nocturnal episode of high-grade AV block leading to 3-second pause and idioventricular escape beat Recommend -- keep follow-up with Dr. Jimmey Ralph and Dr. Royann Shivers to review results

## 2023-04-16 NOTE — Telephone Encounter (Signed)
 Spoke with patient to complete pre-procedure call.     New medical conditions? No Recent hospitalizations or surgeries? No Started any new medications? No Patient made aware to contact office to inform of any new medications started. Any changes in activities of daily living? No  Pre-procedure testing scheduled: CT completed on 04/01/23 and lab work ordered.  Confirmed patient is taking Eliquis and will continue taking medication before procedure or it may need to be rescheduled.  Confirmed patient is scheduled for Atrial Fibrillation Ablation and Loop Recorder Insertion on Monday, April 28 with Dr. Michele Rockers. Instructed patient to arrive at the Main Entrance A at [facility]: [address] [place], [place] [address] and check in at Admitting at 5:30 AM  Advised of plan to go home the same day and will only stay overnight if medically necessary. You MUST have a responsible adult to drive you home and MUST be with you the first 24 hours after you arrive home or your procedure could be cancelled.  Patient verbalized understanding to information provided and is agreeable to proceed with procedure.

## 2023-04-18 ENCOUNTER — Encounter: Payer: Self-pay | Admitting: Cardiovascular Disease

## 2023-04-18 ENCOUNTER — Ambulatory Visit: Payer: PRIVATE HEALTH INSURANCE | Attending: Cardiovascular Disease | Admitting: Cardiovascular Disease

## 2023-04-18 VITALS — BP 130/80 | HR 70 | Ht 68.0 in | Wt 155.6 lb

## 2023-04-18 DIAGNOSIS — I480 Paroxysmal atrial fibrillation: Secondary | ICD-10-CM | POA: Insufficient documentation

## 2023-04-18 DIAGNOSIS — E7800 Pure hypercholesterolemia, unspecified: Secondary | ICD-10-CM | POA: Diagnosis present

## 2023-04-18 DIAGNOSIS — I150 Renovascular hypertension: Secondary | ICD-10-CM | POA: Diagnosis not present

## 2023-04-18 DIAGNOSIS — I701 Atherosclerosis of renal artery: Secondary | ICD-10-CM | POA: Diagnosis not present

## 2023-04-18 DIAGNOSIS — G4733 Obstructive sleep apnea (adult) (pediatric): Secondary | ICD-10-CM | POA: Diagnosis present

## 2023-04-18 DIAGNOSIS — I774 Celiac artery compression syndrome: Secondary | ICD-10-CM | POA: Insufficient documentation

## 2023-04-18 DIAGNOSIS — J410 Simple chronic bronchitis: Secondary | ICD-10-CM | POA: Diagnosis present

## 2023-04-18 DIAGNOSIS — I10 Essential (primary) hypertension: Secondary | ICD-10-CM | POA: Insufficient documentation

## 2023-04-18 MED ORDER — HYDROCHLOROTHIAZIDE 12.5 MG PO CAPS: ORAL_CAPSULE | ORAL | Status: DC

## 2023-04-18 NOTE — Progress Notes (Signed)
 Cardiology Office Note:    Date:  04/18/2023   ID:  Troy Chapman, DOB 1955-04-27, MRN [id number]  PCP:  Genia Del   [facility] Providers Cardiologist:  Thurmon Fair, MD Electrophysiologist:  Nobie Putnam, MD     Referring MD: Jac Canavan, PA-C   No chief complaint on file. Troy Chapman is a 68 y.o. male who is being seen today for the evaluation of uncontrolled HTN at the request of Genia Del. He is incidentally found to have atrial fibrillation today.   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  Renal duplex ultrasound showed evidence of significant left renal artery stenosis and after starting treatment with angiotensin receptor blockers his blood pressure is now very well-controlled.  In fact sometimes it seems to be low to him (the lowest he seen is 110/60).  He has not had dizziness or syncope on a regular basis.  A few days ago he did have an episode of severe weakness, pounding heartbeat and a sensation of near syncope.  He was sitting in his chair and slowly stretching his neck, which he does often for psoriatic arthritis.  He suddenly felt very weak, racing heartbeat and could not get up for 10 or 15 minutes.  The symptoms gradually subsided but they did happen again, not as severe and without any neck motion about a week later.  He does not have any chest pain or exertional dyspnea.  He denies orthopnea, PND, lower extremity edema.  He was not aware of palpitations when he had atrial fibrillation in the past.  He has not had any bleeding problems on Eliquis, but is having a hard time affording the  expensive prescription medication.  He was referred for a Watchman device, but unfortunately the anatomy of the appendage precludes the procedure.  Dr. Jimmey Ralph has him scheduled for atrial fibrillation ablation in implantation of a loop recorder 05/12/2023.  Hopefully the ablation is successful he will be able to discontinue anticoagulants, while being monitored with a loop recorder.  Past Medical History:  Diagnosis Date   Abnormal lung field 11/06/2020   Abnormal lung sounds 08/15/2021   Anger    Anxiety    Aortic atherosclerosis (HCC) 10/29/2021   Arthritis    Benign paroxysmal vertigo of right ear 12/21/2022   Chronic abdominal pain 08/15/2021   Chronic diarrhea 08/15/2021   Contracture of joint of both hands 09/19/2017   Coronary artery disease involving native coronary artery of native heart without angina pectoris 10/29/2021   Cyclic citrullinated peptide (CCP) antibody positive 12/26/2020   Diarrhea 11/06/2020   Dizziness 11/07/2022   Epigastric pain 11/06/2020   Essential hypertension 12/15/2013   Fatigue 09/19/2017   Generalized anxiety disorder 04/30/2013   High risk medication use 11/06/2020   Humerus fracture    left   Hyperlipidemia 10/29/2021   Hypertension    Hypogonadism in male 12/21/2014   Low testosterone 09/19/2017   Open fracture of tooth 02/05/2019   Osteoarthritis of hand    Paresthesia of both hands 12/15/2013   Primary osteoarthritis of both hands 12/21/2014   Psoriasis 09/08/2019   Pulmonary nodule 10/29/2021   Screening for cancer 12/21/2014   Screening for heart disease 08/15/2021  Screening for lipid disorders 08/15/2021   Screening for prostate cancer 08/15/2021   Sensorineural hearing loss, bilateral 11/07/2022   Sleep apnea 2016   no cpap, per pt   Tobacco use disorder 04/30/2013   Wears contact lenses    Weight loss 11/06/2020    Past Surgical History:  Procedure Laterality Date   COLONOSCOPY  2012   Dr. Elnoria Howard   KNEE ARTHROSCOPY      right x 3   ORIF HUMERUS FRACTURE Left 02/04/2020   Procedure: OPEN REDUCTION INTERNAL FIXATION (ORIF) HUMERAL SHAFT FRACTURE;  Surgeon: Sheral Apley, MD;  Location: [facility];  Service: Orthopedics;  Laterality: Left;  block   SHOULDER ARTHROSCOPY     left x 2, and RTC    Current Medications: Current Meds  Medication Sig   apixaban (ELIQUIS) 5 MG TABS tablet Take 1 tablet (5 mg total) by mouth 2 (two) times daily.   Certolizumab Pegol (CIMZIA [place]) Inject into the skin.   diazepam (VALIUM) 5 MG tablet TAKE 1 TABLET BY MOUTH ONCE DAILY AT NIGHT   Fluticasone-Umeclidin-Vilant (TRELEGY ELLIPTA) 200-62.5-25 MCG/ACT AEPB Inhale 1 puff into the lungs daily.   leflunomide (ARAVA) 20 MG tablet Take 20 mg by mouth daily.   meloxicam (MOBIC) 15 MG tablet Take 1 tablet by mouth once daily   rosuvastatin (CRESTOR) 10 MG tablet Take 1 tablet (10 mg total) by mouth daily.   valsartan (DIOVAN) 160 MG tablet Take 1 tablet (160 mg total) by mouth daily.   [DISCONTINUED] hydrochlorothiazide (MICROZIDE) 12.5 MG capsule Take 1 capsule (12.5 mg total) by mouth daily.     Allergies:   Azor [amlodipine-olmesartan], Benazepril, and Gabapentin   Social History   Socioeconomic History   Marital status: Married    Spouse name: Not on file   Number of children: Not on file   Years of education: Not on file   Highest education level: Not on file  Occupational History   Not on file  Tobacco Use   Smoking status: Every Day    Current packs/day: 1.00    Average packs/day: 1 pack/day for 50.0 years (50.0 ttl pk-yrs)    Types: Cigarettes   Smokeless tobacco: Never   Tobacco comments:    01/31/2023 Patient smokes a pack daily  Vaping Use   Vaping status: Never Used  Substance and Sexual Activity   Alcohol use: Yes    Alcohol/week: 10.0 standard drinks of alcohol    Types: 10 Glasses of wine per week    Comment: every day beers and then some wine   Drug use: No   Sexual activity:  Not on file  Other Topics Concern   Not on file  Social History Narrative   Married, 2 children, owns Holiday representative business, active on the job. Right handed   Social Drivers of Corporate investment banker Strain: Not on file  Food Insecurity: Not on file  Transportation Needs: Not on file  Physical Activity: Not on file  Stress: Not on file  Social Connections: Not on file     Family History: [name] family history includes Alzheimer's disease in his father; Dementia in his father; Heart disease in his mother; Stroke in his maternal grandmother. There is no history of Cancer.  ROS:   Please see the history of present illness.     All other systems reviewed and are negative.  EKGs/Labs/Other Studies Reviewed:    The following studies were reviewed today: Coronary calcium CT score:  1. Total calcium score of 153 is at percentile 65 for subjects of the same age, gender, and race/ethnicity. 2. Tiny solid pulmonary nodules which are most pronounced at the lung bases, largest measures 3 mm, likely sequela of infection or aspiration. No follow-up needed if patient is low-risk (and has no known or suspected primary neoplasm). Non-contrast chest CT can be considered in 12 months if patient is high-risk. This recommendation follows the consensus statement: Guidelines for Management of Incidental Pulmonary Nodules Detected on CT Images: From the Fleischner Society 2017; Radiology 2017; 284:228-243. 3. Mild aortic valve calcifications, findings can be seen the setting of aortic sclerosis or stenosis. Correlate with echocardiography. 4. Mild aortic Atherosclerosis (ICD10-I70.0).        Recent Labs: 08/22/2022: TSH 1.45  Recent Lipid Panel    Component Value Date/Time   CHOL 156 08/15/2021 1040   TRIG 69 08/15/2021 1040   HDL 75 08/15/2021 1040   CHOLHDL 2.1 08/15/2021 1040   CHOLHDL 1.7 12/21/2014 0001   VLDL 11 12/21/2014 0001   LDLCALC 68 08/15/2021 1040     Risk  Assessment/Calculations:    CHA2DS2-VASc Score = 3   This indicates a 3.2% annual risk of stroke. [name] score is based upon: CHF History: 0 HTN History: 1 Diabetes History: 0 Stroke History: 0 Vascular Disease History: 1 Age Score: 1 Gender Score: 0               Physical Exam:    VS:  BP 130/80   Pulse 70   Ht 5\' 8"  (1.727 m)   Wt 155 lb 9.6 oz (70.6 kg)   SpO2 96%   BMI 23.66 kg/m     Wt Readings from Last 3 Encounters:  04/18/23 155 lb 9.6 oz (70.6 kg)  04/14/23 154 lb (69.9 kg)  03/19/23 158 lb 6.4 oz (71.8 kg)     GEN:  Well nourished, well developed in no acute distress HEENT: Normal NECK: No JVD; No carotid bruits LYMPHATICS: No lymphadenopathy CARDIAC: irregular, no murmurs, rubs, gallops RESPIRATORY:  RLL wheezing and rhonchi  ABDOMEN: Soft, non-tender, non-distended MUSCULOSKELETAL:  No edema; No deformity  SKIN: Warm and dry NEUROLOGIC:  Alert and oriented x 3 PSYCHIATRIC:  Normal affect   ASSESSMENT:    1. Essential hypertension   2. Paroxysmal atrial fibrillation (HCC)   3. Renovascular hypertension   4. Left renal artery stenosis (HCC)   5. Celiac artery stenosis (HCC)   6. OSA (obstructive sleep apnea)   7. Simple chronic bronchitis (HCC)   8. Hypercholesterolemia    PLAN:    In order of problems listed above:  Afib:  Newly diagnosed paroxysmal atrial fibrillation (AFib) identified on EKG. Spontaneously rate controlled (sign of AV node disease). Asymptomatic, unaware of arrhythmia. Heart rate in normal rhythm is usually 60. Avoid beta blockers. Discussed increased stroke risk. Contributing factors include poorly controlled hypertension, untreated sleep apnea, and significant alcohol consumption (which he has started to moderate).  Currently on Eliquis and scheduled for ablation procedure and loop recorder implantation on 05/12/2023 Dr. Jimmey Ralph.  Unfortunately, left atrial appendage anatomy did not permit watchman device.  He  understands that he will need to continue anticoagulation for at least 3 months following the ablation procedure.  Consider switching to generic dabigatran rather than Eliquis due to cost.  He has enough Eliquis to last until the ablation procedure.   2.  Renovascular hypertension Adding the ARB has led to exceptionally good blood pressure control.  He has normal  renal function.  Renal artery stenting does not appear to be necessary.  Also incidentally noted a severe celiac artery stenosis but patent SMA and good collateral flow.   3.  Sleep Apnea Untreated sleep apnea, previously unable to tolerate CPAP. Candidate for the Inspire device due to lean body habitus. Discussed potential benefits of the Inspire device in maintaining airway patency during sleep. - Refer to Dr. Christia Reading (ENT) for evaluation of Inspire device  4.  Psoriatic Arthritis Psoriatic arthritis with recent severe flare-up, leading to significant swelling and pain in hands and knees. Currently on a tapering dose of prednisone, meloxicam, and leflunomide. Recently started Cimzia (certolizumab) injections.  5.  Smoking (one pack per day for 50+ years)/COPD: primarily with symptoms of chronic bronchitis.  Strongly encouraged permanent smoking cessation.  6.  Excessive alcohol use - advised reduction to no more than 1-2 drinks a day  7. HLP: continue rosuvastatin.  Target LDL less than 70.             Medication Adjustments/Labs and Tests Ordered: Current medicines are reviewed at length with the patient today.  Concerns regarding medicines are outlined above.  Orders Placed This Encounter  Procedures   EKG 12-Lead   Meds ordered this encounter  Medications   hydrochlorothiazide (MICROZIDE) 12.5 MG capsule    Sig: Take 12.5mg  every Monday, Wednesday, and Friday    Dose change- take Monday, Wed, and Friday    Patient Instructions  Medication Instructions:  Take hydrochlorothiazide on Monday, Wednesday, and  Fridays *If you need a refill on your cardiac medications before your next appointment, please call your pharmacy*  Follow-Up: At [facility], you and your health needs are our priority.  As part of our continuing mission to provide you with exceptional heart care, our providers are all part of one team.  This team includes your primary Cardiologist (physician) and Advanced Practice Providers or APPs (Physician Assistants and Nurse Practitioners) who all work together to provide you with the care you need, when you need it.  Your next appointment:   6 month(s)  Provider:   Thurmon Fair, MD     We recommend signing up for the patient portal called "MyChart".  Sign up information is provided on this After Visit Summary.  MyChart is used to connect with patients for Virtual Visits (Telemedicine).  Patients are able to view lab/test results, encounter notes, upcoming appointments, etc.  Non-urgent messages can be sent to your provider as well.   To learn more about what you can do with MyChart, go to [email or web address].        1st Floor: - Lobby - Registration  - Pharmacy  - Lab - Cafe  2nd Floor: - PV Lab - Diagnostic Testing (echo, CT, nuclear med)  3rd Floor: - Vacant  4th Floor: - TCTS (cardiothoracic surgery) - AFib Clinic - Structural Heart Clinic - Vascular Surgery  - Vascular Ultrasound  5th Floor: - HeartCare Cardiology (general and EP) - Clinical Pharmacy for coumadin, hypertension, lipid, weight-loss medications, and med management appointments    Valet parking services will be available as well.      Signed, Thurmon Fair, MD  04/18/2023 8:42 AM    [facility]

## 2023-04-18 NOTE — Patient Instructions (Signed)
 Medication Instructions:  Take hydrochlorothiazide on Monday, Wednesday, and Fridays *If you need a refill on your cardiac medications before your next appointment, please call your pharmacy*  Follow-Up: At [facility], you and your health needs are our priority.  As part of our continuing mission to provide you with exceptional heart care, our providers are all part of one team.  This team includes your primary Cardiologist (physician) and Advanced Practice Providers or APPs (Physician Assistants and Nurse Practitioners) who all work together to provide you with the care you need, when you need it.  Your next appointment:   6 month(s)  Provider:   Thurmon Fair, MD     We recommend signing up for the patient portal called "MyChart".  Sign up information is provided on this After Visit Summary.  MyChart is used to connect with patients for Virtual Visits (Telemedicine).  Patients are able to view lab/test results, encounter notes, upcoming appointments, etc.  Non-urgent messages can be sent to your provider as well.   To learn more about what you can do with MyChart, go to [email or web address].        1st Floor: - Lobby - Registration  - Pharmacy  - Lab - Cafe  2nd Floor: - PV Lab - Diagnostic Testing (echo, CT, nuclear med)  3rd Floor: - Vacant  4th Floor: - TCTS (cardiothoracic surgery) - AFib Clinic - Structural Heart Clinic - Vascular Surgery  - Vascular Ultrasound  5th Floor: - HeartCare Cardiology (general and EP) - Clinical Pharmacy for coumadin, hypertension, lipid, weight-loss medications, and med management appointments    Valet parking services will be available as well.

## 2023-04-28 ENCOUNTER — Telehealth (HOSPITAL_BASED_OUTPATIENT_CLINIC_OR_DEPARTMENT_OTHER): Payer: Self-pay

## 2023-04-28 NOTE — Telephone Encounter (Addendum)
 Called patient regarding results. Left detailed message for patient regarding results.----- Message from Cannon Kettle sent at 04/08/2023  8:46 PM EDT ----- Normal sinus rhythm with average heart rate 72 2% PAC burden Nonsustained atrial tachycardia (detected with symptomatic patient event) Nocturnal episodes second-degree AV block Mobitz type I Single, nocturnal episode of high-grade AV block leading to 3-second pause and idioventricular escape beat Recommend -- keep follow-up with Dr. Jimmey Ralph and Dr. Royann Shivers to review results

## 2023-05-05 ENCOUNTER — Telehealth (HOSPITAL_COMMUNITY): Payer: Self-pay

## 2023-05-05 NOTE — Telephone Encounter (Signed)
 Attempted to reach patient to discuss upcoming procedure, no answer. Left VM for patient to return call.

## 2023-05-06 LAB — BASIC METABOLIC PANEL WITH GFR
BUN/Creatinine Ratio: 17 (ref 10–24)
BUN: 36 mg/dL — ABNORMAL HIGH (ref 8–27)
CO2: 16 mmol/L — ABNORMAL LOW (ref 20–29)
Calcium: 8.9 mg/dL (ref 8.6–10.2)
Chloride: 108 mmol/L — ABNORMAL HIGH (ref 96–106)
Creatinine, Ser: 2.07 mg/dL — ABNORMAL HIGH (ref 0.76–1.27)
Glucose: 92 mg/dL (ref 70–99)
Potassium: 4.6 mmol/L (ref 3.5–5.2)
Sodium: 141 mmol/L (ref 134–144)
eGFR: 35 mL/min/{1.73_m2} — ABNORMAL LOW (ref >59)

## 2023-05-06 LAB — CBC
Hematocrit: 45.5 % (ref 37.5–51.0)
Hemoglobin: 14.2 g/dL (ref 13.0–17.7)
MCH: 28.7 pg (ref 26.6–33.0)
MCHC: 31.2 g/dL — ABNORMAL LOW (ref 31.5–35.7)
MCV: 92 fL (ref 79–97)
Platelets: 123 10*3/uL — ABNORMAL LOW (ref 150–450)
RBC: 4.94 x10E6/uL (ref 4.14–5.80)
RDW: 13.8 % (ref 11.6–15.4)
WBC: 6.3 10*3/uL (ref 3.4–10.8)

## 2023-05-06 NOTE — Telephone Encounter (Addendum)
 Call placed to patient to discuss upcoming procedure.   CT: completed.  Labs: completed- Dr. Daneil Dunker made aware of outstanding values.  Any recent signs of acute illness or been started on antibiotics? No Any new medications started? No Any medications to hold? No Any missed doses of blood thinner? No Advised patient to continue taking ANTICOAGULANT: Eliquis  (Apixaban ) twice daily without missing any doses.  Medication instructions:  On the morning of your procedure DO NOT take any medication., including Eliquis  or the procedure may be rescheduled. Nothing to eat or drink after midnight prior to your procedure.  Confirmed patient is scheduled for Atrial Fibrillation Ablation and Loop Recorder Insertion on Monday, April 28 with Dr. Clinton Danas. Instructed patient to arrive at the Main Entrance A at [facility]: [address] [place], [place] [address] and check in at Admitting at 5:30 AM.  Advised of plan to go home the same day and will only stay overnight if medically necessary. You MUST have a responsible adult to drive you home and MUST be with you the first 24 hours after you arrive home or your procedure could be cancelled.  Patient verbalized understanding to all instructions provided and agreed to proceed with procedure.

## 2023-05-07 NOTE — Telephone Encounter (Signed)
 Per Dr. Daneil Dunker, We need to have him stop his hydrochlorothiazide  and make sure not taking meloxicam . Really encourage hydration and repeat BMP on morning of ablation. I will also communicate labs to his primary cardiologist.   Informed patient of provider recommendations. He repeated back instructions, verbalizing understanding. BMP order entered for day of procedure.

## 2023-05-09 NOTE — Pre-Procedure Instructions (Signed)
 Instructed patient on the following items: Arrival time 0515 Nothing to eat or drink after midnight No meds AM of procedure Responsible person to drive you home and stay with you for 24 hrs  Have you missed any doses of anti-coagulant Eliquis- takes twice a day, hasn't missed any doses.  Don't take dose morning of procedure.

## 2023-05-11 NOTE — Anesthesia Preprocedure Evaluation (Signed)
 Anesthesia Evaluation  Patient identified by MRN, date of birth, ID band Patient awake    Reviewed: Allergy & Precautions, NPO status , Patient's Chart, lab work & pertinent test results  Airway Mallampati: II  TM Distance: >3 FB Neck ROM: Full    Dental no notable dental hx.    Pulmonary sleep apnea , Current Smoker   Pulmonary exam normal        Cardiovascular hypertension, + CAD  + dysrhythmias Atrial Fibrillation  Rhythm:Irregular Rate:Normal      1. Left ventricular ejection fraction, by estimation, is 60 to 65%. The left ventricle has normal function. The left ventricle has no regional wall motion abnormalities. Left ventricular diastolic parameters were normal.  2. Right ventricular systolic function is normal. The right ventricular size is normal.  3. The mitral valve is degenerative. No evidence of mitral valve regurgitation. No evidence of mitral stenosis.  4. The aortic valve is tricuspid. Aortic valve regurgitation is not visualized. No aortic stenosis is present.  5. The inferior vena cava is normal in size with greater than 50% respiratory variability, suggesting right atrial pressure of 3 mmHg.      Neuro/Psych   Anxiety     negative neurological ROS  negative psych ROS   GI/Hepatic negative GI ROS, Neg liver ROS,,,  Endo/Other    Renal/GU      Musculoskeletal   Abdominal Normal abdominal exam  (+)   Peds  Hematology   Anesthesia Other Findings   Reproductive/Obstetrics                             Anesthesia Physical Anesthesia Plan  ASA: 3  Anesthesia Plan: General   Post-op Pain Management:    Induction: Intravenous  PONV Risk Score and Plan: 1 and Ondansetron , Dexamethasone , Midazolam  and Treatment may vary due to age or medical condition  Airway Management Planned: Mask and Oral ETT  Additional Equipment: None  Intra-op Plan:   Post-operative Plan:  Extubation in OR  Informed Consent: I have reviewed the patients History and Physical, chart, labs and discussed the procedure including the risks, benefits and alternatives for the proposed anesthesia with the patient or authorized representative who has indicated his/her understanding and acceptance.     Dental advisory given  Plan Discussed with: CRNA  Anesthesia Plan Comments:        Anesthesia Quick Evaluation

## 2023-05-12 ENCOUNTER — Ambulatory Visit (HOSPITAL_BASED_OUTPATIENT_CLINIC_OR_DEPARTMENT_OTHER): Payer: Self-pay | Admitting: Anesthesiology

## 2023-05-12 ENCOUNTER — Ambulatory Visit (HOSPITAL_COMMUNITY): Payer: Self-pay | Admitting: Anesthesiology

## 2023-05-12 ENCOUNTER — Ambulatory Visit (HOSPITAL_COMMUNITY)
Admission: RE | Admit: 2023-05-12 | Discharge: 2023-05-12 | Disposition: A | Discharge status: Home / Self Care | Payer: PRIVATE HEALTH INSURANCE | Attending: Cardiology | Admitting: Cardiology

## 2023-05-12 ENCOUNTER — Encounter (HOSPITAL_COMMUNITY): Payer: Self-pay | Admitting: Cardiology

## 2023-05-12 ENCOUNTER — Encounter (HOSPITAL_COMMUNITY)
Admission: RE | Disposition: A | Discharge status: Home / Self Care | Payer: Self-pay | Source: Home / Self Care | Attending: Cardiology

## 2023-05-12 ENCOUNTER — Other Ambulatory Visit: Payer: Self-pay

## 2023-05-12 DIAGNOSIS — I10 Essential (primary) hypertension: Secondary | ICD-10-CM

## 2023-05-12 DIAGNOSIS — Z79899 Other long term (current) drug therapy: Secondary | ICD-10-CM | POA: Insufficient documentation

## 2023-05-12 DIAGNOSIS — Z01812 Encounter for preprocedural laboratory examination: Secondary | ICD-10-CM

## 2023-05-12 DIAGNOSIS — G4730 Sleep apnea, unspecified: Secondary | ICD-10-CM | POA: Insufficient documentation

## 2023-05-12 DIAGNOSIS — I2510 Atherosclerotic heart disease of native coronary artery without angina pectoris: Secondary | ICD-10-CM | POA: Diagnosis not present

## 2023-05-12 DIAGNOSIS — F17210 Nicotine dependence, cigarettes, uncomplicated: Secondary | ICD-10-CM

## 2023-05-12 DIAGNOSIS — I480 Paroxysmal atrial fibrillation: Secondary | ICD-10-CM

## 2023-05-12 DIAGNOSIS — D6869 Other thrombophilia: Secondary | ICD-10-CM | POA: Insufficient documentation

## 2023-05-12 DIAGNOSIS — Z7901 Long term (current) use of anticoagulants: Secondary | ICD-10-CM | POA: Diagnosis not present

## 2023-05-12 DIAGNOSIS — G4733 Obstructive sleep apnea (adult) (pediatric): Secondary | ICD-10-CM | POA: Diagnosis not present

## 2023-05-12 DIAGNOSIS — L4050 Arthropathic psoriasis, unspecified: Secondary | ICD-10-CM | POA: Insufficient documentation

## 2023-05-12 HISTORY — PX: ATRIAL FIBRILLATION ABLATION: EP1191

## 2023-05-12 HISTORY — PX: LOOP RECORDER INSERTION: EP1214

## 2023-05-12 LAB — BASIC METABOLIC PANEL WITH GFR
Anion gap: 10 (ref 5–15)
BUN: 29 mg/dL — ABNORMAL HIGH (ref 8–23)
CO2: 19 mmol/L — ABNORMAL LOW (ref 22–32)
Calcium: 8.5 mg/dL — ABNORMAL LOW (ref 8.9–10.3)
Chloride: 109 mmol/L (ref 98–111)
Creatinine, Ser: 1.77 mg/dL — ABNORMAL HIGH (ref 0.61–1.24)
GFR, Estimated: 42 mL/min — ABNORMAL LOW (ref >60)
Glucose, Bld: 81 mg/dL (ref 70–99)
Potassium: 4.1 mmol/L (ref 3.5–5.1)
Sodium: 138 mmol/L (ref 135–145)

## 2023-05-12 SURGERY — ATRIAL FIBRILLATION ABLATION
Anesthesia: General

## 2023-05-12 MED ORDER — ONDANSETRON HCL 4 MG/2ML IJ SOLN: 4.0000 mg | Freq: Four times a day (QID) | INTRAMUSCULAR | Status: DC | PRN

## 2023-05-12 MED ORDER — APIXABAN 5 MG PO TABS: 5.0000 mg | ORAL_TABLET | Freq: Once | ORAL | Status: DC

## 2023-05-12 MED ORDER — SODIUM CHLORIDE 0.9 % IV SOLN: INTRAVENOUS | Status: DC

## 2023-05-12 MED ORDER — LIDOCAINE-EPINEPHRINE 1 %-1:100000 IJ SOLN
INTRAMUSCULAR | Status: AC
  Filled 2023-05-12: qty 1

## 2023-05-12 MED ORDER — SODIUM CHLORIDE 0.9 % IV SOLN: 250.0000 mL | INTRAVENOUS | Status: DC | PRN

## 2023-05-12 MED ORDER — ATROPINE SULFATE 0.4 MG/ML IV SOLN
INTRAVENOUS | Status: DC | PRN
  Administered 2023-05-12: 1 mg via INTRAVENOUS

## 2023-05-12 MED ORDER — SUGAMMADEX SODIUM 200 MG/2ML IV SOLN
INTRAVENOUS | Status: DC | PRN
  Administered 2023-05-12: 150 mg via INTRAVENOUS

## 2023-05-12 MED ORDER — LIDOCAINE 2% (20 MG/ML) 5 ML SYRINGE
INTRAMUSCULAR | Status: DC | PRN
  Administered 2023-05-12: 80 mg via INTRAVENOUS

## 2023-05-12 MED ORDER — ONDANSETRON HCL 4 MG/2ML IJ SOLN
INTRAMUSCULAR | Status: DC | PRN
  Administered 2023-05-12: 4 mg via INTRAVENOUS

## 2023-05-12 MED ORDER — HEPARIN (PORCINE) IN NACL 1000-0.9 UT/500ML-% IV SOLN
INTRAVENOUS | Status: DC | PRN
  Administered 2023-05-12 (×3): 500 mL

## 2023-05-12 MED ORDER — CEFAZOLIN SODIUM-DEXTROSE 2-4 GM/100ML-% IV SOLN
2.0000 g | Freq: Once | INTRAVENOUS | Status: AC
  Administered 2023-05-12: 2 g via INTRAVENOUS

## 2023-05-12 MED ORDER — ROCURONIUM BROMIDE 10 MG/ML (PF) SYRINGE
PREFILLED_SYRINGE | INTRAVENOUS | Status: DC | PRN
Start: 2023-05-12 — End: 2023-05-12
  Administered 2023-05-12: 30 mg via INTRAVENOUS
  Administered 2023-05-12: 70 mg via INTRAVENOUS

## 2023-05-12 MED ORDER — PHENYLEPHRINE 80 MCG/ML (10ML) SYRINGE FOR IV PUSH (FOR BLOOD PRESSURE SUPPORT)
PREFILLED_SYRINGE | INTRAVENOUS | Status: DC | PRN
  Administered 2023-05-12 (×2): 160 ug via INTRAVENOUS
  Administered 2023-05-12: 80 ug via INTRAVENOUS
  Administered 2023-05-12: 160 ug via INTRAVENOUS

## 2023-05-12 MED ORDER — FENTANYL CITRATE (PF) 250 MCG/5ML IJ SOLN
INTRAMUSCULAR | Status: DC | PRN
  Administered 2023-05-12: 100 ug via INTRAVENOUS

## 2023-05-12 MED ORDER — CEFAZOLIN SODIUM-DEXTROSE 2-4 GM/100ML-% IV SOLN
INTRAVENOUS | Status: AC
  Filled 2023-05-12: qty 100

## 2023-05-12 MED ORDER — FENTANYL CITRATE (PF) 100 MCG/2ML IJ SOLN
INTRAMUSCULAR | Status: AC
  Filled 2023-05-12: qty 2

## 2023-05-12 MED ORDER — PROTAMINE SULFATE 10 MG/ML IV SOLN
INTRAVENOUS | Status: DC | PRN
Start: 2023-05-12 — End: 2023-05-12
  Administered 2023-05-12: 35 mg via INTRAVENOUS

## 2023-05-12 MED ORDER — PHENYLEPHRINE HCL-NACL 20-0.9 MG/250ML-% IV SOLN
INTRAVENOUS | Status: DC | PRN
  Administered 2023-05-12: 40 ug/min via INTRAVENOUS

## 2023-05-12 MED ORDER — PROPOFOL 10 MG/ML IV BOLUS
INTRAVENOUS | Status: DC | PRN
  Administered 2023-05-12: 160 mg via INTRAVENOUS

## 2023-05-12 MED ORDER — ACETAMINOPHEN 325 MG PO TABS: 650.0000 mg | ORAL_TABLET | ORAL | Status: DC | PRN

## 2023-05-12 MED ORDER — SODIUM CHLORIDE 0.9% FLUSH: 3.0000 mL | INTRAVENOUS | Status: DC | PRN

## 2023-05-12 MED ORDER — SODIUM CHLORIDE 0.9% FLUSH: 3.0000 mL | Freq: Two times a day (BID) | INTRAVENOUS | Status: DC

## 2023-05-12 MED ORDER — HEPARIN SODIUM (PORCINE) 1000 UNIT/ML IJ SOLN
INTRAMUSCULAR | Status: DC | PRN
  Administered 2023-05-12: 12000 [IU] via INTRAVENOUS
  Administered 2023-05-12: 4000 [IU] via INTRAVENOUS

## 2023-05-12 MED ORDER — EPHEDRINE SULFATE-NACL 50-0.9 MG/10ML-% IV SOSY
PREFILLED_SYRINGE | INTRAVENOUS | Status: DC | PRN
  Administered 2023-05-12 (×2): 5 mg via INTRAVENOUS

## 2023-05-12 MED ORDER — LIDOCAINE-EPINEPHRINE 1 %-1:100000 IJ SOLN
INTRAMUSCULAR | Status: DC | PRN
  Administered 2023-05-12: 20 mL

## 2023-05-12 MED ORDER — PROPOFOL 500 MG/50ML IV EMUL
INTRAVENOUS | Status: DC | PRN
  Administered 2023-05-12: 95 ug/kg/min via INTRAVENOUS

## 2023-05-12 SURGICAL SUPPLY — 22 items
BAG SNAP BAND KOVER 36X36 (MISCELLANEOUS) IMPLANT
BLANKET WARM UNDERBOD FULL ACC (MISCELLANEOUS) ×1 IMPLANT
CABLE PFA RX CATH CONN (CABLE) IMPLANT
CATH BI DIR 7FR CS F-J 12 PIN (CATHETERS) IMPLANT
CATH FARAWAVE ABLATION 31 (CATHETERS) IMPLANT
CATH GE 8FR SOUNDSTAR (CATHETERS) IMPLANT
CATH OCTARAY 2.0 F 3-3-3-3-3 (CATHETERS) IMPLANT
CLOSURE PERCLOSE PROSTYLE (VASCULAR PRODUCTS) IMPLANT
COVER SWIFTLINK CONNECTOR (BAG) ×1 IMPLANT
DEVICE CLOSURE MYNXGRIP 6/7F (Vascular Products) IMPLANT
DILATOR VESSEL 38 20CM 16FR (INTRODUCER) IMPLANT
GUIDEWIRE INQWIRE 1.5J.035X260 (WIRE) IMPLANT
KIT VERSACROSS CNCT FARADRIVE (KITS) IMPLANT
MONITOR LUX-DX II+ M312 (Prosthesis & Implant Heart) IMPLANT
PACK EP LF (CUSTOM PROCEDURE TRAY) ×1 IMPLANT
PACK LOOP INSERTION (CUSTOM PROCEDURE TRAY) ×1 IMPLANT
PAD DEFIB RADIO PHYSIO CONN (PAD) ×1 IMPLANT
PATCH CARTO3 (PAD) IMPLANT
SHEATH FARADRIVE STEERABLE (SHEATH) IMPLANT
SHEATH PINNACLE 8F 10CM (SHEATH) IMPLANT
SHEATH PINNACLE 9F 10CM (SHEATH) IMPLANT
SHEATH PROBE COVER 6X72 (BAG) IMPLANT

## 2023-05-12 NOTE — Transfer of Care (Signed)
 Immediate Anesthesia Transfer of Care Note  Patient: Troy Chapman  Procedure(s) Performed: ATRIAL FIBRILLATION ABLATION LOOP RECORDER INSERTION  Patient Location: Cath Lab  Anesthesia Type:General  Level of Consciousness: drowsy  Airway & Oxygen Therapy: Patient Spontanous Breathing  Post-op Assessment: Report given to RN and Post -op Vital signs reviewed and stable  Post vital signs: Reviewed and stable  Last Vitals:  Vitals Value Taken Time  BP 97/74 05/12/23 1200  Temp 36.6 C 05/12/23 1005  Pulse 64 05/12/23 1204  Resp 15 05/12/23 1205  SpO2 93 % 05/12/23 1204  Vitals shown include unfiled device data.  Last Pain:  Vitals:   05/12/23 1150  TempSrc:   PainSc: 2          Complications: There were no known notable events for this encounter.

## 2023-05-12 NOTE — Anesthesia Postprocedure Evaluation (Signed)
 Anesthesia Post Note  Patient: Troy Chapman  Procedure(s) Performed: ATRIAL FIBRILLATION ABLATION LOOP RECORDER INSERTION     Patient location during evaluation: PACU Anesthesia Type: General Level of consciousness: awake and alert Pain management: pain level controlled Vital Signs Assessment: post-procedure vital signs reviewed and stable Respiratory status: spontaneous breathing, nonlabored ventilation, respiratory function stable and patient connected to nasal cannula oxygen Cardiovascular status: blood pressure returned to baseline and stable Postop Assessment: no apparent nausea or vomiting Anesthetic complications: no   There were no known notable events for this encounter.  Last Vitals:  Vitals:   05/12/23 1315 05/12/23 1330  BP:    Pulse:  64  Resp: 13 17  Temp:    SpO2:  100%    Last Pain:  Vitals:   05/12/23 1150  TempSrc:   PainSc: 2                  Lennyn Bellanca P Aidynn Krenn

## 2023-05-12 NOTE — Interval H&P Note (Signed)
 History and Physical Interval Note:  05/12/2023 7:12 AM  Troy Chapman  has presented today for surgery, with the diagnosis of paroxysmal atrial fibrillation.  The various methods of treatment have been discussed with the patient and family. After consideration of risks, benefits and other options for treatment, the patient has consented to  Procedure(s): ATRIAL FIBRILLATION ABLATION (N/A) LOOP RECORDER INSERTION (N/A) as a surgical intervention.  [name] history has been reviewed, patient examined, no change in status, stable for surgery.  I have reviewed the patient's chart and labs.  Questions were answered to the patient's satisfaction.     Ardeen Kohler

## 2023-05-12 NOTE — Anesthesia Procedure Notes (Signed)
 Procedure Name: Intubation Date/Time: 05/12/2023 7:41 AM  Performed by: Claud Crumb, CRNAPre-anesthesia Checklist: Patient identified, Emergency Drugs available, Suction available and Patient being monitored Patient Re-evaluated:Patient Re-evaluated prior to induction Oxygen Delivery Method: Circle System Utilized Preoxygenation: Pre-oxygenation with 100% oxygen Induction Type: IV induction Ventilation: Mask ventilation without difficulty Laryngoscope Size: Glidescope and 3 Grade View: Grade I Tube type: Oral Tube size: 7.5 mm Number of attempts: 1 Airway Equipment and Method: Stylet and Oral airway Placement Confirmation: ETT inserted through vocal cords under direct vision, positive ETCO2 and breath sounds checked- equal and bilateral Secured at: 23 cm Tube secured with: Tape Dental Injury: Teeth and Oropharynx as per pre-operative assessment  Comments: Intubation by SRNA.

## 2023-05-12 NOTE — Discharge Instructions (Signed)

## 2023-05-13 ENCOUNTER — Encounter: Payer: Self-pay | Admitting: Cardiovascular Disease

## 2023-05-13 ENCOUNTER — Telehealth (HOSPITAL_COMMUNITY): Payer: Self-pay

## 2023-05-13 DIAGNOSIS — Z79899 Other long term (current) drug therapy: Secondary | ICD-10-CM

## 2023-05-13 DIAGNOSIS — R7989 Other specified abnormal findings of blood chemistry: Secondary | ICD-10-CM

## 2023-05-13 DIAGNOSIS — I701 Atherosclerosis of renal artery: Secondary | ICD-10-CM

## 2023-05-13 LAB — POCT ACTIVATED CLOTTING TIME
Activated Clotting Time: 285 s
Activated Clotting Time: 348 s

## 2023-05-13 MED ORDER — VALSARTAN 80 MG PO TABS: 80.0000 mg | ORAL_TABLET | Freq: Every day | ORAL | 3 refills | Status: DC

## 2023-05-13 NOTE — Telephone Encounter (Signed)
 Croitoru, Karyl Paget, MD  Ardeen Kohler, MD; Marlon Simpson, RN Thanks, Josh, I will definitely follow up on that. Katie, I called him and told him to stop hydrochlorothiazide , stop meloxicam  and cut valsartan  to 80 mg daily (cut the 160 mg tabs in half) and get a repeat BMET next Monday (he says there is a [facility] in [place] right around the corner from him). Can you pleas put those orders in? Thanks!   Med changes made; RX for Valsartan  sent to pharmacy; BMET order placed and released.

## 2023-05-13 NOTE — Telephone Encounter (Signed)
 Attempted to reach patient to follow up with procedure completed on 05/12/23, no answer. Left VM for patient to return call.

## 2023-05-21 ENCOUNTER — Other Ambulatory Visit (HOSPITAL_COMMUNITY): Payer: Self-pay

## 2023-05-21 ENCOUNTER — Encounter: Payer: Self-pay | Admitting: Cardiovascular Disease

## 2023-05-21 LAB — BASIC METABOLIC PANEL WITH GFR
BUN/Creatinine Ratio: 14 (ref 10–24)
BUN: 22 mg/dL (ref 8–27)
CO2: 21 mmol/L (ref 20–29)
Calcium: 9 mg/dL (ref 8.6–10.2)
Chloride: 109 mmol/L — ABNORMAL HIGH (ref 96–106)
Creatinine, Ser: 1.55 mg/dL — ABNORMAL HIGH (ref 0.76–1.27)
Glucose: 89 mg/dL (ref 70–99)
Potassium: 4.9 mmol/L (ref 3.5–5.2)
Sodium: 143 mmol/L (ref 134–144)
eGFR: 49 mL/min/{1.73_m2} — ABNORMAL LOW (ref >59)

## 2023-05-21 MED ORDER — TRAMADOL HCL 50 MG PO TABS
50.0000 mg | ORAL_TABLET | Freq: Four times a day (QID) | ORAL | 0 refills | Status: DC | PRN
  Filled 2023-05-21: qty 20, 5d supply, fill #0

## 2023-05-21 NOTE — Telephone Encounter (Signed)
BP update as requested  

## 2023-05-22 ENCOUNTER — Other Ambulatory Visit (HOSPITAL_COMMUNITY): Payer: Self-pay

## 2023-05-22 MED ORDER — TRAMADOL HCL 50 MG PO TABS: 50.0000 mg | ORAL_TABLET | Freq: Four times a day (QID) | ORAL | 0 refills | Status: DC | PRN

## 2023-06-05 ENCOUNTER — Other Ambulatory Visit: Payer: Self-pay | Admitting: Neurology

## 2023-06-05 ENCOUNTER — Other Ambulatory Visit

## 2023-06-10 ENCOUNTER — Ambulatory Visit (HOSPITAL_COMMUNITY)
Admission: RE | Admit: 2023-06-10 | Discharge: 2023-06-10 | Disposition: A | Discharge status: Home / Self Care | Payer: PRIVATE HEALTH INSURANCE | Source: Ambulatory Visit | Attending: Internal Medicine | Admitting: Internal Medicine

## 2023-06-10 VITALS — BP 128/86 | HR 64 | Ht 68.0 in | Wt 157.0 lb

## 2023-06-10 DIAGNOSIS — D6869 Other thrombophilia: Secondary | ICD-10-CM | POA: Diagnosis not present

## 2023-06-10 DIAGNOSIS — I480 Paroxysmal atrial fibrillation: Secondary | ICD-10-CM | POA: Diagnosis present

## 2023-06-10 DIAGNOSIS — I4891 Unspecified atrial fibrillation: Secondary | ICD-10-CM | POA: Insufficient documentation

## 2023-06-10 NOTE — Progress Notes (Signed)
 Primary Care Physician: Claudene Crystal, PA-C Primary Cardiologist: Luana Rumple, MD Electrophysiologist: Ardeen Kohler, MD     Referring Physician: Dr. Williams Harrison is a 68 y.o. male with a history of vertigo, HTN, psoriatic arthritis, sleep apnea not on CPAP, coronary calcification, alcohol use, and atrial fibrillation who presents for consultation in the [facility]. Patient is on Eliquis  5 mg BID for a CHADS2VASC score of 3.  On evaluation today, patient is currently in NSR. S/p Afib ablation and ILR implant on 05/12/23 by Dr. Daneil Dunker. Brief episode of Afib a couple of weeks ago. No chest pain or SOB. Leg sites healed without issue. No missed doses of anticoagulant.  Today, he denies symptoms of orthopnea, PND, lower extremity edema, dizziness, presyncope, syncope, snoring, daytime somnolence, bleeding, or neurologic sequela. The patient is tolerating medications without difficulties and is otherwise without complaint today.    he has a BMI of Body mass index is 23.87 kg/m.Aaron Aas Filed Weights   06/10/23 1053  Weight: 71.2 kg    Current Outpatient Medications  Medication Sig Dispense Refill   apixaban  (ELIQUIS ) 5 MG TABS tablet Take 1 tablet (5 mg total) by mouth 2 (two) times daily. 60 tablet 11   certolizumab pegol (CIMZIA) 2 X 200 MG KIT Inject 200 mg into the skin every 2 (two) months.     diazepam  (VALIUM ) 5 MG tablet TAKE 1 TABLET BY MOUTH ONCE DAILY AT NIGHT 30 tablet 0   meloxicam  (MOBIC ) 15 MG tablet Take 15 mg by mouth as needed for pain.     rosuvastatin  (CRESTOR ) 10 MG tablet Take 1 tablet (10 mg total) by mouth daily. (Patient taking differently: Take 5 mg by mouth daily.) 90 tablet 0   traMADol  (ULTRAM ) 50 MG tablet Take 1 tablet (50 mg total) by mouth every 6 (six) hours as needed for moderate pain (pain score 4-6). 20 tablet 0   valsartan  (DIOVAN ) 80 MG tablet Take 1 tablet (80 mg total) by mouth daily. 90 tablet 3    No current facility-administered medications for this encounter.    Atrial Fibrillation Management history:  Previous antiarrhythmic drugs: none Previous cardioversions: none Previous ablations: 05/12/23 Anticoagulation history: Eliquis    ROS- All systems are reviewed and negative except as per the HPI above.  Physical Exam: BP 128/86   Pulse 64   Ht 5\' 8"  (1.727 m)   Wt 71.2 kg   BMI 23.87 kg/m   GEN: Well nourished, well developed in no acute distress NECK: No JVD; No carotid bruits CARDIAC: Regular rate and rhythm, no murmurs, rubs, gallops RESPIRATORY:  Clear to auscultation without rales, wheezing or rhonchi  ABDOMEN: Soft, non-tender, non-distended EXTREMITIES:  No edema; No deformity   EKG today demonstrates  Vent. rate 64 BPM PR interval 180 ms QRS duration 82 ms QT/QTcB 394/406 ms P-R-T axes 71 30 17 Sinus rhythm with Fusion complexes Otherwise normal ECG When compared with ECG of 12-May-2023 10:31, Fusion complexes are now Present  Echo 03/25/23 demonstrated  1. Left ventricular ejection fraction, by estimation, is 60 to 65%. The  left ventricle has normal function. The left ventricle has no regional  wall motion abnormalities. Left ventricular diastolic parameters were  normal.   2. Right ventricular systolic function is normal. The right ventricular  size is normal.   3. The mitral valve is degenerative. No evidence of mitral valve  regurgitation. No evidence of mitral stenosis.   4. The aortic  valve is tricuspid. Aortic valve regurgitation is not  visualized. No aortic stenosis is present.   5. The inferior vena cava is normal in size with greater than 50%  respiratory variability, suggesting right atrial pressure of 3 mmHg.   ASSESSMENT & PLAN CHA2DS2-VASc Score = 3  [name] score is based upon: CHF History: 0 HTN History: 1 Diabetes History: 0 Stroke History: 0 Vascular Disease History: 1 Age Score: 1 Gender Score: 0        ASSESSMENT AND PLAN: Paroxysmal Atrial Fibrillation (ICD10:  I48.0) [name] CHA2DS2-VASc score is 3, indicating a 3.2% annual risk of stroke.   S/p Afib ablation and ILR implant by Dr. Daneil Dunker on 05/13/23.  He is currently in NSR.   Secondary Hypercoagulable State (ICD10:  D68.69) The patient is at significant risk for stroke/thromboembolism based upon his CHA2DS2-VASc Score of 3.  Continue Apixaban  (Eliquis ).  No missed doses of Eliquis .   Follow up with EP as scheduled.    Minnie Amber, PA-C  [facility] [address] [place], [place] [address] [phone]

## 2023-06-12 ENCOUNTER — Other Ambulatory Visit (INDEPENDENT_AMBULATORY_CARE_PROVIDER_SITE_OTHER)

## 2023-06-12 DIAGNOSIS — I10 Essential (primary) hypertension: Secondary | ICD-10-CM

## 2023-06-12 NOTE — Progress Notes (Signed)
 06/12/2023 Name: Troy Chapman MRN: [id number] DOB: [birth year]  Chief Complaint  Patient presents with   Medication Management   Hypertension    Troy Chapman is a 68 y.o. year old male who presented for a telephone visit.   They were referred to the pharmacist by their PCP for assistance in managing hypertension.    Subjective:  Care Team: Primary Care Provider: Garner Jury ;   Medication Access/Adherence  Current Pharmacy:  [facility] [address] [place], [place] - [address] [place] [address] Phone: [phone] Fax: [phone]   Patient reports affordability concerns with their medications: No  Patient reports access/transportation concerns to their pharmacy: No  Patient reports adherence concerns with their medications:  No   Hypertension:  Current medications: Valsartan  80mg  Medications previously tried: Amlodipine /Olmesartan , Benazepril , Lisinopril , HCTZ  Patient has a validated, automated, upper arm home BP cuff Current blood pressure readings: reports controlled readings and readings are at goal at cardiology visits  Patient denies hypotensive s/sx including dizziness Patient denies hypertensive symptoms including headache, chest pain, shortness of breath   Objective:  Lab Results  Component Value Date   HGBA1C 5.8 08/22/2022    Lab Results  Component Value Date   CREATININE 1.55 (H) 05/20/2023   BUN 22 05/20/2023   NA 143 05/20/2023   K 4.9 05/20/2023   CL 109 (H) 05/20/2023   CO2 21 05/20/2023    Lab Results  Component Value Date   CHOL 156 08/15/2021   HDL 75 08/15/2021   LDLCALC 68 08/15/2021   TRIG 69 08/15/2021   CHOLHDL 2.1 08/15/2021    Medications Reviewed Today     Reviewed by Carnell Christian, RPH (Pharmacist) on 06/12/23 at 1417  Med List Status: <None>   Medication Order Taking? Sig Documenting Provider Last Dose Status Informant  apixaban  (ELIQUIS ) 5 MG  TABS tablet [id number] Yes Take 1 tablet (5 mg total) by mouth 2 (two) times daily. Conan December, PA-C Taking Active Self  certolizumab pegol (CIMZIA) 2 X 200 MG KIT [id number]  Inject 200 mg into the skin every 2 (two) months. [provider]  Active Self  diazepam  (VALIUM ) 5 MG tablet [id number]  TAKE 1 TABLET BY MOUTH ONCE DAILY AT Namon Avena, MD  Active Self  meloxicam  (MOBIC ) 15 MG tablet [id number]  Take 15 mg by mouth as needed for pain. [provider]  Active   rosuvastatin  (CRESTOR ) 10 MG tablet [id number]  Take 1 tablet (10 mg total) by mouth daily.  Patient taking differently: Take 5 mg by mouth daily.   Claudene Crystal, PA-C  Expired 06/10/23 2359 Self           Med Note Vivian Groom, MELISSA R   Wed May 07, 2023  4:40 PM)    traMADol  (ULTRAM ) 50 MG tablet [id number]  Take 1 tablet (50 mg total) by mouth every 6 (six) hours as needed for moderate pain (pain score 4-6). Croitoru, Mihai, MD  Active   valsartan  (DIOVAN ) 80 MG tablet [id number] Yes Take 1 tablet (80 mg total) by mouth daily. Croitoru, Mihai, MD Taking Active               Assessment/Plan:   Hypertension: - Currently controlled - Reviewed long term cardiovascular and renal outcomes of uncontrolled blood pressure - Reviewed appropriate blood pressure monitoring technique and reviewed goal blood pressure. Recommended to check home blood pressure and heart rate daily and  keep a log, bring with you to all appts -Continue current medication therapy     Hyperlipidemia/ASCVD Risk Reduction: -Continue Rosuvastatin  5mg  at bedtime  -Needs to schedule f/u with PCP, patient declines at this time and will reach out     Follow Up Plan: 09/04/23  Carnell Christian, PharmD Clinical Pharmacist [phone]

## 2023-06-13 ENCOUNTER — Other Ambulatory Visit (HOSPITAL_COMMUNITY): Payer: Self-pay

## 2023-06-13 MED ORDER — APIXABAN 5 MG PO TABS
5.0000 mg | ORAL_TABLET | Freq: Two times a day (BID) | ORAL | Status: DC
Start: 2023-06-10 — End: 2023-09-04

## 2023-06-16 ENCOUNTER — Ambulatory Visit (INDEPENDENT_AMBULATORY_CARE_PROVIDER_SITE_OTHER): Payer: PRIVATE HEALTH INSURANCE

## 2023-06-16 DIAGNOSIS — I4891 Unspecified atrial fibrillation: Secondary | ICD-10-CM

## 2023-06-19 LAB — CUP PACEART REMOTE DEVICE CHECK
Date Time Interrogation Session: 20250604152100
Implantable Pulse Generator Implant Date: 20250428
Pulse Gen Serial Number: 133067

## 2023-06-23 ENCOUNTER — Telehealth: Payer: Self-pay

## 2023-06-23 ENCOUNTER — Ambulatory Visit: Payer: Self-pay | Admitting: Cardiology

## 2023-06-23 NOTE — Telephone Encounter (Signed)
Attempted to call the patient. No answer- I left a message to please call back.  

## 2023-06-23 NOTE — Telephone Encounter (Signed)
 Pt returning your call

## 2023-06-23 NOTE — Telephone Encounter (Signed)
Pt returning nurse call. Please advise.

## 2023-06-23 NOTE — Telephone Encounter (Signed)
 I called and spoke with the patient. He is aware of Dr. Orinda Birkenhead recommendation that he is agreeable the patient is having nocturnal bradycardia. I advised the patient Dr. Daneil Dunker has no further recommendations at this time.   The patient voices understanding is agreeable.

## 2023-06-23 NOTE — Telephone Encounter (Addendum)
 ILR for post AF ablation:   Alert remote transmission: pause 1 pause event with 3 total pauses up to 3 sec on 6/8 at 0534 with non conducted P waves.  Sending due to first pause and non conducted P waves.   Alert remote transmission: Mirian Ames episode. 1 [place] episode on 06/19/23 at 01:14, 9 sec, EGM c/w intermittent 2:1 HB with increased gain in portal; first [place] event  Both episodes occurred during periods of sleep for patient.   Patient states that he feels very poorly, almost like he is back in afib.  Noticeable difference towards the end of last week.  He became very weak feeling and could not do anything over the weekend.  Forwarding to Dr. Daneil Dunker for review.   Aaron Aas

## 2023-07-17 ENCOUNTER — Ambulatory Visit (INDEPENDENT_AMBULATORY_CARE_PROVIDER_SITE_OTHER): Payer: PRIVATE HEALTH INSURANCE

## 2023-07-17 DIAGNOSIS — I4891 Unspecified atrial fibrillation: Secondary | ICD-10-CM | POA: Diagnosis not present

## 2023-07-21 ENCOUNTER — Ambulatory Visit: Payer: Self-pay | Admitting: Cardiology

## 2023-07-21 LAB — CUP PACEART REMOTE DEVICE CHECK
Date Time Interrogation Session: 20250703201900
Implantable Pulse Generator Implant Date: 20250428
Pulse Gen Serial Number: 133067

## 2023-07-29 ENCOUNTER — Encounter: Payer: Self-pay | Admitting: Pulmonary Disease

## 2023-07-29 NOTE — Progress Notes (Signed)
 Electrophysiology Office Note:   Date:  08/06/2023  ID:  Troy Chapman, DOB 08-12-1955, MRN [id number]  Primary Cardiologist: Jerel Balding, MD Primary Heart Failure: None Electrophysiologist: Fonda Kitty, MD      History of Present Illness:   Troy Chapman is a 68 y.o. male with h/o AF, HTN, CAD, vertigo, OSA not on CPAP, tobacco abuse, pulmonary nodule, CCP antibody positive seen today for routine electrophysiology follow-up s/p Ablation.  Alert Transmission received in the device clinic which showed 3 second pauses with non-conducted P waves at night.   Since last being seen in our clinic the patient reports he has not had AF that he is aware of since ablation. He feels his energy is improved but over the weekend had fatigue, body aches almost like the had the flu. He wonders if he had AF.  He continues to smoke.      He denies chest pain, palpitations, dyspnea, PND, orthopnea, nausea, vomiting, dizziness, syncope, edema, weight gain, or early satiety.    Review of systems complete and found to be negative unless listed in HPI.   EP Information / Studies Reviewed:    EKG is ordered today. Personal review as below.  EKG Interpretation Date/Time:  Wednesday August 06 2023 09:18:30 EDT Ventricular Rate:  55 PR Interval:  190 QRS Duration:  66 QT Interval:  382 QTC Calculation: 365 R Axis:   20  Text Interpretation: Sinus bradycardia Confirmed by Aniceto Jarvis ([id number]) on 08/06/2023 9:29:55 AM   Arrhythmia / AAD AF  EPS 05/12/23 > successful PVI, ablation of posterior wall [facility] LUX ILR 04/2823 > implant post ablation     Risk Assessment/Calculations:    CHA2DS2-VASc Score = 3   This indicates a 3.2% annual risk of stroke. [name] score is based upon: CHF History: 0 HTN History: 1 Diabetes History: 0 Stroke History: 0 Vascular Disease History: 1 Age Score: 1 Gender Score: 0             Physical Exam:   VS:  BP 128/74   Pulse (!) 55   Ht 5' 8  (1.727 m)   Wt 155 lb (70.3 kg)   SpO2 98%   BMI 23.57 kg/m    Wt Readings from Last 3 Encounters:  08/06/23 155 lb (70.3 kg)  06/10/23 157 lb (71.2 kg)  05/12/23 155 lb (70.3 kg)     GEN: Well nourished, well developed in no acute distress NECK: No JVD; No carotid bruits CARDIAC: Regular rate and rhythm, no murmurs, rubs, gallops RESPIRATORY:  Clear to auscultation without rales, wheezing or rhonchi  ABDOMEN: Soft, non-tender, non-distended EXTREMITIES:  No edema; No deformity   ASSESSMENT AND PLAN:    Paroxysmal Atrial Fibrillation  CHA2DS2-VASc 3, s/p PVI + PW ablation 05/12/23 & ILR implant  -continue OAC for stroke prophylaxis  -ILR review shows 0 % AF burden > however, he was disconnected from his monitor for a period of time with his bluetooth off. Disussed reconnecting once he gets home and ensuring the APP is left open in the background of his phone   Secondary Hypercoagulable State  -shared decision with patient, given ILR in place, he is comfortable coming off [facility] & monitoring -he is frustrated that the Eliquis  is not off patent and that it costs him 500$ per month.  He states he is on a limited income.  He thought we could continue to give him samples.  We discussed patient assistance and he states he tried and  makes too much money.  We reviewed next best option may be pradaxa if he requires going back on OAC.  However, he has not had AF post ablation and has a reliable monitor in place.    CAD  -no anginal symptoms   Hypertension  -elevated on initial check > repeat wnl   Follow up with Dr. Kennyth or EP APP in 6 months  Signed, Daphne Barrack, NP-C, AGACNP-BC [facility] - Electrophysiology  08/06/2023, 10:01 AM

## 2023-08-04 NOTE — Addendum Note (Signed)
 Addended by: TAWNI DRILLING D on: 08/04/2023 05:49 PM   Modules accepted: Orders

## 2023-08-04 NOTE — Progress Notes (Signed)
 Bsx Loop Recorder

## 2023-08-06 ENCOUNTER — Ambulatory Visit: Payer: PRIVATE HEALTH INSURANCE | Attending: Pulmonary Disease | Admitting: Pulmonary Disease

## 2023-08-06 ENCOUNTER — Encounter: Payer: Self-pay | Admitting: Pulmonary Disease

## 2023-08-06 ENCOUNTER — Encounter: Payer: Self-pay | Admitting: Cardiology

## 2023-08-06 VITALS — BP 128/74 | HR 55 | Ht 68.0 in | Wt 155.0 lb

## 2023-08-06 DIAGNOSIS — D6869 Other thrombophilia: Secondary | ICD-10-CM | POA: Diagnosis present

## 2023-08-06 DIAGNOSIS — I480 Paroxysmal atrial fibrillation: Secondary | ICD-10-CM | POA: Insufficient documentation

## 2023-08-06 NOTE — Patient Instructions (Signed)
 Medication Instructions:  Your physician recommends that you continue on your current medications as directed. Please refer to the Current Medication list given to you today.  *If you need a refill on your cardiac medications before your next appointment, please call your pharmacy*  Lab Work: None ordered If you have labs (blood work) drawn today and your tests are completely normal, you will receive your results only by: MyChart Message (if you have MyChart) OR A paper copy in the mail If you have any lab test that is abnormal or we need to change your treatment, we will call you to review the results.  Follow-Up: At [facility], you and your health needs are our priority.  As part of our continuing mission to provide you with exceptional heart care, our providers are all part of one team.  This team includes your primary Cardiologist (physician) and Advanced Practice Providers or APPs (Physician Assistants and Nurse Practitioners) who all work together to provide you with the care you need, when you need it.  Your next appointment:   6 month(s)  Provider:   You may see Ardeen Kohler, MD or one of the following Advanced Practice Providers on your designated Care Team:   Mertha Abrahams, PA-C Michael Andy Tillery, PA-C Suzann Riddle, NP Creighton Doffing, NP

## 2023-08-18 ENCOUNTER — Ambulatory Visit: Payer: PRIVATE HEALTH INSURANCE

## 2023-08-18 DIAGNOSIS — I4891 Unspecified atrial fibrillation: Secondary | ICD-10-CM

## 2023-08-19 LAB — CUP PACEART REMOTE DEVICE CHECK
Date Time Interrogation Session: 20250804121800
Implantable Pulse Generator Implant Date: 20250428
Pulse Gen Serial Number: 133067

## 2023-08-21 ENCOUNTER — Ambulatory Visit: Payer: Self-pay | Admitting: Cardiology

## 2023-09-04 ENCOUNTER — Other Ambulatory Visit

## 2023-09-04 DIAGNOSIS — I10 Essential (primary) hypertension: Secondary | ICD-10-CM

## 2023-09-04 NOTE — Progress Notes (Signed)
 09/04/2023 Name: Troy Chapman MRN: [id number] DOB: [birth year]  Chief Complaint  Patient presents with   Medication Management    Troy Chapman is a 68 y.o. year old male who presented for a telephone visit.   They were referred to the pharmacist by their PCP for assistance in managing hypertension.    Subjective:  Care Team: Primary Care Provider: Bulah Alm GORMAN Chapman ;   Medication Access/Adherence  Current Pharmacy:  [facility] [address] [place], [place] - [address] [place] [address] Phone: [phone] Fax: [phone]   Patient reports affordability concerns with their medications: No  Patient reports access/transportation concerns to their pharmacy: No  Patient reports adherence concerns with their medications:  No   Hypertension:  Current medications: Valsartan  80mg  -- taking 1/2 tab daily Medications previously tried: Amlodipine /Olmesartan , Benazepril , Lisinopril , HCTZ  Patient has a validated, automated, upper arm home BP cuff Current blood pressure readings: reports controlled readings and readings are at goal at cardiology visits  Patient denies hypotensive s/sx including dizziness Patient denies hypertensive symptoms including headache, chest pain, shortness of breath  Hyperlipidemia/ASCVD Risk Reduction  Current lipid lowering medications: None Medications tried in the past: Rosuvastatin  5mg  - just stopped, does not like taking medications  Atrial Fibrillation:  Current medications: None  Medications tried in the past: Eliquis  - stopped post-ablation       Objective:  Lab Results  Component Value Date   HGBA1C 5.8 08/22/2022    Lab Results  Component Value Date   CREATININE 1.55 (H) 05/20/2023   BUN 22 05/20/2023   NA 143 05/20/2023   K 4.9 05/20/2023   CL 109 (H) 05/20/2023   CO2 21 05/20/2023    Lab Results  Component Value Date   CHOL 156 08/15/2021   HDL 75  08/15/2021   LDLCALC 68 08/15/2021   TRIG 69 08/15/2021   CHOLHDL 2.1 08/15/2021    Medications Reviewed Today     Reviewed by Troy Chapman, RPH (Pharmacist) on 09/04/23 at 1549  Med List Status: <None>   Medication Order Taking? Sig Documenting Provider Last Dose Status Informant    Discontinued 09/04/23 1548 (Completed Course)   certolizumab pegol (CIMZIA) 2 X 200 MG KIT [id number]  Inject 200 mg into the skin every 2 (two) months.  Patient not taking: Reported on 09/04/2023   [provider]  Active Self    Discontinued 09/04/23 1548 (Completed Course) meloxicam  (MOBIC ) 15 MG tablet [id number] Yes Take 15 mg by mouth as needed for pain. [provider]  Active    Patient taking differently:   Discontinued 09/04/23 1549 (Patient Preference)          Med Note Troy Chapman, Troy Chapman   Wed May 07, 2023  4:40 PM)      Discontinued 09/04/23 1549 (Patient Preference)   valsartan  (DIOVAN ) 80 MG tablet [id number] Yes Take 1 tablet (80 mg total) by mouth daily.  Patient taking differently: Take 80 mg by mouth daily. 1/2 tab (40mg ) daily   Croitoru, Mihai, MD  Active               Assessment/Plan:   Hypertension: - Currently controlled - Reviewed long term cardiovascular and renal outcomes of uncontrolled blood pressure - Reviewed appropriate blood pressure monitoring technique and reviewed goal blood pressure. Recommended to check home blood pressure and heart rate daily and keep a log, bring with you to all appts -Continue current medication therapy  Hyperlipidemia/ASCVD Risk Reduction: -Recommend restarting rosuvastatin , patient declines. Wants an updated lipid panel first -Needs to schedule f/u with PCP, message sent to front office about reaching out to patient to schedule at patient request  Atrial Fibrillation: - Currently controlled -If anticoag needs to be initiated in future, patient may qualify for xarelto PAP as they reportedly waive the 4% OOP  spending requirements for program     Follow Up Plan: 3 months  Troy Chapman, PharmD Clinical Pharmacist [phone]

## 2023-09-09 ENCOUNTER — Telehealth: Payer: Self-pay | Admitting: Medical

## 2023-09-09 NOTE — Telephone Encounter (Signed)
 Left message for pt to call. Per Jon, pharmacist, pt needs fasting med management appointment with Ludie  ( or set up for physical if available opening soon)

## 2023-09-22 ENCOUNTER — Ambulatory Visit (INDEPENDENT_AMBULATORY_CARE_PROVIDER_SITE_OTHER): Payer: PRIVATE HEALTH INSURANCE

## 2023-09-22 DIAGNOSIS — I4891 Unspecified atrial fibrillation: Secondary | ICD-10-CM | POA: Diagnosis not present

## 2023-09-23 ENCOUNTER — Ambulatory Visit (INDEPENDENT_AMBULATORY_CARE_PROVIDER_SITE_OTHER): Admitting: Medical

## 2023-09-23 ENCOUNTER — Encounter: Payer: Self-pay | Admitting: Medical

## 2023-09-23 VITALS — BP 160/84 | HR 84 | Ht 68.0 in | Wt 158.6 lb

## 2023-09-23 DIAGNOSIS — E785 Hyperlipidemia, unspecified: Secondary | ICD-10-CM

## 2023-09-23 DIAGNOSIS — I2510 Atherosclerotic heart disease of native coronary artery without angina pectoris: Secondary | ICD-10-CM

## 2023-09-23 DIAGNOSIS — R7989 Other specified abnormal findings of blood chemistry: Secondary | ICD-10-CM

## 2023-09-23 DIAGNOSIS — I10 Essential (primary) hypertension: Secondary | ICD-10-CM | POA: Diagnosis not present

## 2023-09-23 DIAGNOSIS — M069 Rheumatoid arthritis, unspecified: Secondary | ICD-10-CM | POA: Insufficient documentation

## 2023-09-23 DIAGNOSIS — M1990 Unspecified osteoarthritis, unspecified site: Secondary | ICD-10-CM

## 2023-09-23 DIAGNOSIS — L4050 Arthropathic psoriasis, unspecified: Secondary | ICD-10-CM | POA: Diagnosis not present

## 2023-09-23 DIAGNOSIS — I700 Atherosclerosis of aorta: Secondary | ICD-10-CM

## 2023-09-23 DIAGNOSIS — T50905D Adverse effect of unspecified drugs, medicaments and biological substances, subsequent encounter: Secondary | ICD-10-CM

## 2023-09-23 MED ORDER — ROSUVASTATIN CALCIUM 5 MG PO TABS: 5.0000 mg | ORAL_TABLET | Freq: Every day | ORAL | 3 refills | Status: DC

## 2023-09-23 NOTE — Progress Notes (Signed)
 Name: Troy Chapman   Date of Visit: 09/23/23   Date of last visit with me: 09/09/2023   CHIEF COMPLAINT:  Chief Complaint  Patient presents with   Hypertension    Med check, no new concerns. Creatinine was 1.5 last labs through rheum 3 weeks ago.       HPI:  Discussed the use of AI scribe software for clinical note transcription with the patient, who gave verbal consent to proceed.  History of Present Illness   Troy Chapman is a 68 year old male with hypertension and paroxysmal atrial fibrillation who presents for a medication check.  He has hypertension with home blood pressure readings similar to the elevated reading observed today. He has a history of seeing cardiology for these issues, including a follow-up visit after surgery about a month ago. He was previously on multiple blood pressure medications but discontinued them due to side effects such as low energy and feeling terrible. Currently, he is taking valsartan  80 mg, which was reduced from 160 mg due to low blood pressure issues.  He dealt with dizziness for several months with amlodipine .  He stopped several blood pressure medicines on his own in the past year.  Most recently he was started on low-dose 12.5 mg hydrochlorothiazide  but this caused his pressure to drop too low so he stopped this as well.  He has a history of paroxysmal atrial fibrillation and underwent an ablation procedure. He stopped taking Eliquis  because he could not afford it. He has a remote monitor for his heart condition and is aware of his ITALY score.  He has a history of cholesterol buildup in his arteries, particularly in the aorta and left anterior descending artery, as shown in a CT coronary test from March 2023. He previously took rosuvastatin  (Crestor ) 5 mg but is not currently taking it. He is not currently on any cholesterol medication.  He suffers from psoriatic arthritis and rheumatoid arthritis and is on Cimzia, which helps with  his symptoms. He experiences significant knee pain and has not taken meloxicam  for three weeks due to kidney concerns. Prednisone was effective for pain but is not a long-term solution. He receives cortisone shots every three to four months, which provide temporary relief.  He has had a recent creatinine level of 1.65 and a GFR of 46. He avoids NSAIDs to prevent further kidney damage.  No current use of CPAP for sleep apnea. Significant knee pain and difficulty walking due to arthritis.  ROS as in subjective  Past Medical History:  Diagnosis Date   Abnormal lung field 11/06/2020   Abnormal lung sounds 08/15/2021   Anger    Anxiety    Aortic atherosclerosis (HCC) 10/29/2021   Arthritis    Atrial fibrillation (HCC)    Benign paroxysmal vertigo of right ear 12/21/2022   Chronic abdominal pain 08/15/2021   Chronic diarrhea 08/15/2021   Contracture of joint of both hands 09/19/2017   Coronary artery disease involving native coronary artery of native heart without angina pectoris 10/29/2021   Cyclic citrullinated peptide (CCP) antibody positive 12/26/2020   Diarrhea 11/06/2020   Dizziness 11/07/2022   Epigastric pain 11/06/2020   Essential hypertension 12/15/2013   Fatigue 09/19/2017   Generalized anxiety disorder 04/30/2013   High risk medication use 11/06/2020   Humerus fracture    left   Hyperlipidemia 10/29/2021   Hypertension    Hypogonadism in male 12/21/2014   Low testosterone  09/19/2017   Open fracture of tooth 02/05/2019  Osteoarthritis of hand    Paresthesia of both hands 12/15/2013   Primary osteoarthritis of both hands 12/21/2014   Psoriasis 09/08/2019   Pulmonary nodule 10/29/2021   Screening for cancer 12/21/2014   Screening for heart disease 08/15/2021   Screening for lipid disorders 08/15/2021   Screening for prostate cancer 08/15/2021   Sensorineural hearing loss, bilateral 11/07/2022   Sleep apnea 2016   no cpap, per pt   Tobacco use disorder 04/30/2013    Wears contact lenses    Weight loss 11/06/2020   Current Outpatient Medications on File Prior to Visit  Medication Sig Dispense Refill   NONFORMULARY OR COMPOUNDED ITEM Take 2-3 capsules by mouth daily.     valsartan  (DIOVAN ) 80 MG tablet Take 1 tablet (80 mg total) by mouth daily. 90 tablet 3   meloxicam  (MOBIC ) 15 MG tablet Take 15 mg by mouth as needed for pain. (Patient not taking: Reported on 09/23/2023)     No current facility-administered medications on file prior to visit.     Objective: BP (!) 160/84   Pulse 84   Ht 5' 8 (1.727 m)   Wt 158 lb 9.6 oz (71.9 kg)   BMI 24.12 kg/m  Gen: wd, wn ,nad     Assessment: Encounter Diagnoses  Name Primary?   Psoriatic arthritis (HCC) Yes   Rheumatoid arthritis, involving unspecified site, unspecified whether rheumatoid factor present (HCC)    Abnormal blood creatinine level    Hypertension, unspecified type    Hyperlipidemia, unspecified hyperlipidemia type    Aortic atherosclerosis (HCC)    Arthritis    Coronary artery disease involving native coronary artery of native heart without angina pectoris    Medication adverse effect, subsequent encounter       Plan: Hypertension Blood pressure remains elevated despite valsartan  80 mg. Intolerance to amlodipine  and hydrochlorothiazide  and lisinopril  noted. - Continue valsartan  80 mg daily (1/2 tablet of 160mg ) daily - Consider splitting valsartan  dose to 80 mg in the morning and 40-80 mg in the evening. - Monitor blood pressure at home and report readings. - Consider alternative antihypertensives if current regimen is not tolerated or effective. - Consult cardiology for further recommendations.  Paroxysmal atrial fibrillation Post-ablation with ITALY score of 3. Discontinued Eliquis  due to cost, currently not on anticoagulation. - Consult cardiology to clarify anticoagulation recommendations.  Coronary artery disease with cholesterol plaque CT coronary test showed mild  cholesterol plaque. Discontinued cholesterol medications due to side effects prior but willing to retry. - Restart rosuvastatin  (Crestor ) 5 mg daily. - Educate on dietary modifications to reduce cholesterol, including a low-carb diet and minimizing saturated fats. - Consider over-the-counter supplements such as fish oil and magnesium.  Abnormal creatinine Recent creatinine of 1.65 mg/dL and GFR of 46 fO/fpw/8.26 m.  Likely due to the effects of uncontrolled high blood pressure and he continues to take NSAIDs regularly He is trying to limit NSAIDs given the kidney issue.  He drinks plenty of water throughout the day Continue to monitor creatinine and kidney function  He has labs through rheumatology quite regularly such as every quarter.  His last couple creatinines have been slightly abnormal but stable Continue to work on good blood pressure control Reiterated the need to try to find a different pain solutions other than staying on NSAIDs.  Significant knee pain due to psoriatic arthritis, difficulty managing pain without NSAIDs. - Avoid NSAIDs to prevent further kidney damage. - Consider alternative pain management strategies, including Cymbalta, gabapentin , or Lyrica. - Consider referral  to pain management for evaluation of pain control options, including potential use of narcotics. - Discuss with rheumatology the potential use of alternative medications such as Plaquenil or other disease-modifying agents.  Psoriatic arthritis with chronic bilateral knee pain Chronic bilateral knee pain secondary to psoriatic arthritis. Currently on Cimzia with 50% improvement, but significant pain persists, impacting mobility and quality of life. - Continue Cimzia for psoriatic arthritis. - Consider alternative medications with rheumatology , such as Otezla, plaquenil, others - Consider over-the-counter supplements such as fish oil and chondroitin/glucosamine for joint health. - Consider referral to  pain management for evaluation of additional pain control options  Consider the following over-the-counter supplements Daily multivitamin.  You can ask the pharmacist to point 1 out for you Magnesium oxide 400 mg daily over-the-counter Glucosamine chondroitin supplement daily for joint health Fish oil such as Pure Encapsulations brand supplement twice daily for brain, joints, lipids    [place] was seen today for hypertension.  Diagnoses and all orders for this visit:  Psoriatic arthritis (HCC)  Rheumatoid arthritis, involving unspecified site, unspecified whether rheumatoid factor present (HCC)  Abnormal blood creatinine level  Hypertension, unspecified type  Hyperlipidemia, unspecified hyperlipidemia type  Aortic atherosclerosis (HCC)  Arthritis  Coronary artery disease involving native coronary artery of native heart without angina pectoris  Medication adverse effect, subsequent encounter  Other orders -     rosuvastatin  (CRESTOR ) 5 MG tablet; Take 1 tablet (5 mg total) by mouth daily.    F/u pending call back

## 2023-09-23 NOTE — Patient Instructions (Signed)
 Hypertension Blood pressure remains elevated despite valsartan  80 mg. Intolerance to amlodipine  and hydrochlorothiazide  and lisinopril  noted. - Continue valsartan  80 mg daily (1/2 tablet of 160mg ) daily - Consider splitting valsartan  dose to 80 mg in the morning and 40-80 mg in the evening. - Monitor blood pressure at home and report readings. - Consider alternative antihypertensives if current regimen is not tolerated or effective. - Consult cardiology for further recommendations.  Paroxysmal atrial fibrillation Post-ablation with ITALY score of 3. Discontinued Eliquis  due to cost, currently not on anticoagulation. - Consult cardiology to clarify anticoagulation recommendations.  Coronary artery disease with cholesterol plaque CT coronary test showed mild cholesterol plaque. Discontinued cholesterol medications due to side effects prior but willing to retry. - Restart rosuvastatin  (Crestor ) 5 mg daily. - Educate on dietary modifications to reduce cholesterol, including a low-carb diet and minimizing saturated fats. - Consider over-the-counter supplements such as fish oil and magnesium.  Abnormal creatinine Recent creatinine of 1.65 mg/dL and GFR of 46 fO/fpw/8.26 m.  Likely due to the effects of uncontrolled high blood pressure and he continues to take NSAIDs regularly He is trying to limit NSAIDs given the kidney issue.  He drinks plenty of water throughout the day Continue to monitor creatinine and kidney function  He has labs through rheumatology quite regularly such as every quarter.  His last couple creatinines have been slightly abnormal but stable Continue to work on good blood pressure control Reiterated the need to try to find a different pain solutions other than staying on NSAIDs.  Significant knee pain due to psoriatic arthritis, difficulty managing pain without NSAIDs. - Avoid NSAIDs to prevent further kidney damage. - Consider alternative pain management strategies,  including Cymbalta, gabapentin , or Lyrica. - Consider referral to pain management for evaluation of pain control options, including potential use of narcotics. - Discuss with rheumatology the potential use of alternative medications such as Plaquenil or other disease-modifying agents.  Psoriatic arthritis with chronic bilateral knee pain Chronic bilateral knee pain secondary to psoriatic arthritis. Currently on Cimzia with 50% improvement, but significant pain persists, impacting mobility and quality of life. - Continue Cimzia for psoriatic arthritis. - Consider alternative medications with rheumatology , such as Otezla, plaquenil, others - Consider over-the-counter supplements such as fish oil and chondroitin/glucosamine for joint health. - Consider referral to pain management for evaluation of additional pain control options  Consider the following over-the-counter supplements Daily multivitamin.  You can ask the pharmacist to point 1 out for you Magnesium oxide 400 mg daily over-the-counter Glucosamine chondroitin supplement daily for joint health Fish oil such as Pure [employer]capsulations brand supplement twice daily for brain, joints, lipids

## 2023-09-24 LAB — CUP PACEART REMOTE DEVICE CHECK
Date Time Interrogation Session: 20250910102200
Implantable Pulse Generator Implant Date: 20250428
Pulse Gen Serial Number: 133067

## 2023-09-26 ENCOUNTER — Other Ambulatory Visit: Payer: Self-pay | Admitting: Medical

## 2023-10-02 NOTE — Progress Notes (Signed)
 Remote Loop Recorder Transmission

## 2023-10-05 ENCOUNTER — Ambulatory Visit: Payer: Self-pay | Admitting: Cardiology

## 2023-10-13 NOTE — Progress Notes (Signed)
 Remote Loop Recorder Transmission

## 2023-10-23 ENCOUNTER — Ambulatory Visit (INDEPENDENT_AMBULATORY_CARE_PROVIDER_SITE_OTHER): Payer: PRIVATE HEALTH INSURANCE

## 2023-10-23 DIAGNOSIS — I4891 Unspecified atrial fibrillation: Secondary | ICD-10-CM

## 2023-10-23 LAB — CUP PACEART REMOTE DEVICE CHECK
Date Time Interrogation Session: 20251009030600
Implantable Pulse Generator Implant Date: 20250428
Pulse Gen Serial Number: 133067

## 2023-10-24 NOTE — Progress Notes (Signed)
 Remote Loop Recorder Transmission

## 2023-10-28 NOTE — Progress Notes (Signed)
 Remote Loop Recorder Transmission

## 2023-11-06 ENCOUNTER — Ambulatory Visit

## 2023-11-08 ENCOUNTER — Ambulatory Visit: Payer: Self-pay | Admitting: Cardiology

## 2023-11-27 ENCOUNTER — Other Ambulatory Visit: Payer: Self-pay

## 2023-11-27 DIAGNOSIS — I10 Essential (primary) hypertension: Secondary | ICD-10-CM

## 2023-11-27 NOTE — Progress Notes (Signed)
 11/27/2023 Name: Troy Chapman MRN: [id number] DOB: Nov 23, 1955  Chief Complaint  Patient presents with   Medication Management   Hypertension   Hyperlipidemia    Troy Chapman is a 68 y.o. year old male who presented for a telephone visit.   They were referred to the pharmacist by their PCP for assistance in managing hypertension.    Subjective:  Care Team: Primary Care Provider: Bulah Alm GORMAN DEVONNA ;   Medication Access/Adherence  Current Pharmacy:  [facility] [address] [place], [place] - [address] [place] [address] Phone: [phone] Fax: [phone]   Patient reports affordability concerns with their medications: No  Patient reports access/transportation concerns to their pharmacy: No  Patient reports adherence concerns with their medications:  No   Hypertension:  Current medications: Valsartan  160mg  -- taking 1/2 tab daily for 80mg  Medications previously tried: Amlodipine /Olmesartan , Benazepril , Lisinopril , HCTZ  Patient has a validated, automated, upper arm home BP cuff Current blood pressure readings: Does not have specific readings to report. Checks at least once a week, tries to check 2-3 times a week. Reports ranging from 120-130s/80s-90s recently  Patient denies hypotensive s/sx including dizziness Patient denies hypertensive symptoms including headache, chest pain, shortness of breath  Hyperlipidemia/ASCVD Risk Reduction  Current lipid lowering medications: Rosuvastatin  Medications tried in the past:   Atrial Fibrillation:  Current medications: None  Medications tried in the past: Eliquis  - stopped post-ablation       Objective:  Lab Results  Component Value Date   HGBA1C 5.8 08/22/2022    Lab Results  Component Value Date   CREATININE 1.55 (H) 05/20/2023   BUN 22 05/20/2023   NA 143 05/20/2023   K 4.9 05/20/2023   CL 109 (H) 05/20/2023   CO2 21 05/20/2023    Lab  Results  Component Value Date   CHOL 156 08/15/2021   HDL 75 08/15/2021   LDLCALC 68 08/15/2021   TRIG 69 08/15/2021   CHOLHDL 2.1 08/15/2021    Medications Reviewed Today     Reviewed by Lionell Jon DEL, RPH (Pharmacist) on 11/27/23 at 1412  Med List Status: <None>   Medication Order Taking? Sig Documenting Provider Last Dose Status Informant  meloxicam  (MOBIC ) 15 MG tablet [id number]  Take 1 tablet by mouth once daily Bulah Alm GORMAN DEVONNA  Active   NONFORMULARY OR COMPOUNDED ITEM [id number]  Take 2-3 capsules by mouth daily. [provider]  Active   rosuvastatin  (CRESTOR ) 5 MG tablet [id number]  Take 1 tablet (5 mg total) by mouth daily. Tysinger, Alm GORMAN, PA-C  Active   valsartan  (DIOVAN ) 80 MG tablet [id number]  Take 1 tablet (80 mg total) by mouth daily. Croitoru, Mihai, MD  Active               Assessment/Plan:   Hypertension: - Currently uncontrolled per last office visit - Reviewed long term cardiovascular and renal outcomes of uncontrolled blood pressure - Reviewed appropriate blood pressure monitoring technique and reviewed goal blood pressure. Recommended to check home blood pressure and heart rate daily and keep a log, bring with you to all appts -Continue current medication therapy. If still elevated at next check, consider increasing valsartan  to 1/2 tab BID to see if divided dosing is better tolerated     Hyperlipidemia/ASCVD Risk Reduction: -Continue rosuvastatin   Atrial Fibrillation: - Currently controlled -If anticoag needs to be initiated in future, patient may qualify for xarelto PAP as they reportedly waive the 4% OOP  spending requirements for program     Follow Up Plan: 1 month  Jon VEAR Lindau, PharmD Clinical Pharmacist [phone]

## 2023-12-09 ENCOUNTER — Ambulatory Visit: Attending: Cardiology

## 2023-12-09 DIAGNOSIS — I4891 Unspecified atrial fibrillation: Secondary | ICD-10-CM

## 2023-12-10 LAB — CUP PACEART REMOTE DEVICE CHECK
Date Time Interrogation Session: 20251125130400
Implantable Pulse Generator Implant Date: 20250428
Pulse Gen Serial Number: 133067

## 2023-12-15 ENCOUNTER — Ambulatory Visit: Payer: Self-pay | Admitting: Cardiology

## 2023-12-15 NOTE — Progress Notes (Signed)
 Remote Loop Recorder Transmission

## 2023-12-18 ENCOUNTER — Telehealth: Payer: Self-pay

## 2023-12-18 NOTE — Telephone Encounter (Signed)
 LM on VM requesting patient call back to discuss.

## 2023-12-18 NOTE — Progress Notes (Unsigned)
 Complex Care Management Care Guide Note  12/18/2023 Name: Troy Chapman MRN: [id number] DOB: 01-03-1956  Troy Chapman is a 68 y.o. year old male who is a primary care patient of Bulah, Alm GORMAN RIGGERS and is actively engaged with the care management team. I reached out to [employer] by phone today to assist with re-scheduling  with the Pharmacist.  Follow up plan: Unsuccessful telephone outreach attempt made. A HIPAA compliant phone message was left for the patient providing contact information and requesting a return call.  [facility] Guide  Direct Dial: [phone]  Fax [phone]

## 2023-12-22 ENCOUNTER — Other Ambulatory Visit: Payer: Self-pay | Admitting: Medical

## 2023-12-22 NOTE — Telephone Encounter (Signed)
 Sent My-Chart message

## 2023-12-24 NOTE — Telephone Encounter (Signed)
 Spoke with Pt.  Discussed results of loop report.  Follow up scheduled with [facility] clinic.  Directions given.

## 2023-12-25 ENCOUNTER — Other Ambulatory Visit

## 2023-12-30 ENCOUNTER — Ambulatory Visit (HOSPITAL_COMMUNITY)
Admission: RE | Admit: 2023-12-30 | Discharge: 2023-12-30 | Attending: Physician Assistant | Admitting: Physician Assistant

## 2023-12-30 ENCOUNTER — Encounter (HOSPITAL_COMMUNITY): Payer: Self-pay | Admitting: Physician Assistant

## 2023-12-30 VITALS — BP 108/76 | HR 69 | Ht 68.0 in | Wt 162.8 lb

## 2023-12-30 DIAGNOSIS — G4733 Obstructive sleep apnea (adult) (pediatric): Secondary | ICD-10-CM

## 2023-12-30 DIAGNOSIS — D6859 Other primary thrombophilia: Secondary | ICD-10-CM

## 2023-12-30 DIAGNOSIS — I2510 Atherosclerotic heart disease of native coronary artery without angina pectoris: Secondary | ICD-10-CM | POA: Insufficient documentation

## 2023-12-30 DIAGNOSIS — I4891 Unspecified atrial fibrillation: Secondary | ICD-10-CM

## 2023-12-30 DIAGNOSIS — I10 Essential (primary) hypertension: Secondary | ICD-10-CM

## 2023-12-30 DIAGNOSIS — I480 Paroxysmal atrial fibrillation: Secondary | ICD-10-CM | POA: Insufficient documentation

## 2023-12-30 MED ORDER — DABIGATRAN ETEXILATE MESYLATE 150 MG PO CAPS: 150.0000 mg | ORAL_CAPSULE | Freq: Two times a day (BID) | ORAL | 1 refills | Status: DC

## 2023-12-30 NOTE — Patient Instructions (Addendum)
 Start Pradaxa  150mg  twice a day - take with food   Have labs drawn at any [facility] in 24month - order attached

## 2023-12-30 NOTE — Progress Notes (Signed)
 Primary Care Physician: Bulah Alm RAMAN, PA-C Primary Cardiologist: Jerel Balding, MD Electrophysiologist: Fonda Kitty, MD  Referring Physician: Jerel Balding, MD   Troy Chapman is a 68 y.o. male with a history of paroxysmal AF (not on AC), CAD, HTN, OSA (not on CPAP), tobacco abuse, vertigo, pulmonary nodule on, who presents for follow up in the [facility].  The patient was initially diagnosed with atrial fibrillation in 01/2023 and started on Eliquis  but felt not to be a long-term candidate due to long-term use of NSAIDs due to psoriatic arthritis.  He was referred to Dr. 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  He return for follow-up on 04/14/2023 and elected to proceed with PVI ablation which took place on 05/12/2023 along with ILR to evaluate burden.  He was seen in by Thom Heinrich, PA on 06/10/2023 and was maintaining sinus rhythm postprocedure.  He was advised to continue anticoagulant at that time.  He was seen by Daphne Barrack, NP on 08/03/2023 and reported no episodes of A-fib.  Eliquis  was discontinued at that time due to low AF burden seen on ILR.  Device alert was received on 12/15/2023 that showed 3 episodes of atrial fibrillation with longest being 52 minutes. Patient presents today for follow up for atrial fibrillation and to discuss resuming AC and treatment for untreated OSA.  Troy Chapman presents today for follow-up of atrial fibrillation.  He reports experiencing quick episodes of AF that are briefly symptomatic with dizziness but resolved in a few seconds.  EKG was completed today showing sinus rhythm with a rate of 69 bpm.  He reports also difficulty with sleeping over the past 2 weeks and increase in daytime fatigue.  He does drink 4-5 alcoholic drinks per day and we briefly discussed the importance of moderation and managing AF bouts.  We also further discussed  better management of sleep apnea and he is in favor of referral to a sleep medicine specialist.  We discussed his stroke risk with CHA2DS2-VASc score of 3 which equals a 3.2% stroke risk per year.  Previously Eliquis  was cost prohibitive and patient is in favor of trying Pradaxa  as a alternative to augment his stroke risk.  He had all other questions answered to his satisfaction during today's visit.   Today, he denies symptoms of palpitations, chest pain, shortness of breath, orthopnea, PND, lower extremity edema, dizziness, presyncope, syncope, snoring, daytime somnolence, bleeding, or neurologic sequela. The patient is tolerating medications without difficulties and is otherwise without complaint today.    Atrial Fibrillation Risk Factors:  History of Sleep Apnea not on CPAP - Reports drinking 4-5 alcoholic drinks per day  Atrial Fibrillation Management history:  Previous antiarrhythmic drugs: None Previous cardioversions: None Previous ablations: 04/2023  Anticoagulation history: Eliquis  discontinued on 06/2023  ROS- All systems are reviewed and negative except as per the HPI above.  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   Coronary artery disease involving native coronary artery of native heart without angina pectoris 10/29/2021   Cyclic citrullinated peptide (  CCP) antibody positive 12/26/2020   Diarrhea 11/06/2020   Dizziness 11/07/2022   Epigastric pain 11/06/2020   Essential hypertension 12/15/2013   Fatigue 09/19/2017   Generalized anxiety disorder 04/30/2013   High risk medication use 11/06/2020   Humerus fracture    left   Hyperlipidemia  10/29/2021   Hypertension    Hypogonadism in male 12/21/2014   Low testosterone  09/19/2017   Open fracture of tooth 02/05/2019   Osteoarthritis of hand    Paresthesia of both hands 12/15/2013   Primary osteoarthritis of both hands 12/21/2014   Psoriasis 09/08/2019   Pulmonary nodule 10/29/2021   Screening for cancer 12/21/2014   Screening for heart disease 08/15/2021   Screening for lipid disorders 08/15/2021   Screening for prostate cancer 08/15/2021   Sensorineural hearing loss, bilateral 11/07/2022   Sleep apnea 2016   no cpap, per pt   Tobacco use disorder 04/30/2013   Wears contact lenses    Weight loss 11/06/2020    Current Outpatient Medications  Medication Sig Dispense Refill   acetaminophen  (TYLENOL ) 650 MG CR tablet Takes up to 6 tablets daily     dabigatran  (PRADAXA ) 150 MG CAPS capsule Take 1 capsule (150 mg total) by mouth 2 (two) times daily. 180 capsule 1   meloxicam  (MOBIC ) 15 MG tablet Take 1 tablet by mouth once daily 90 tablet 0   valsartan  (DIOVAN ) 160 MG tablet Take 160 mg by mouth daily.     certolizumab pegol (CIMZIA) 2 X 200 MG KIT Inject into the skin every 30 (thirty) days.     No current facility-administered medications for this encounter.    Physical Exam: BP 108/76   Pulse 69   Ht 5' 8 (1.727 m)   Wt 73.8 kg   BMI 24.75 kg/m   GEN: Well nourished, well developed in no acute distress NECK: No JVD; No carotid bruits CARDIAC: Regular rate and rhythm, no murmurs, rubs, gallops RESPIRATORY:  Clear to auscultation without rales, wheezing or rhonchi  ABDOMEN: Soft, non-tender, non-distended EXTREMITIES:  No edema; No deformity   Wt Readings from Last 3 Encounters:  12/30/23 73.8 kg  09/23/23 71.9 kg  08/06/23 70.3 kg     EKG today demonstrates:   EKG Interpretation Date/Time:  Tuesday December 30 2023 11:26:58 EST Ventricular Rate:  69 PR Interval:  172 QRS Duration:  74 QT Interval:  380 QTC Calculation: 407 R Axis:   28  Text  Interpretation: Sinus rhythm with Premature atrial complexes Confirmed by Wyn Manus [phone]) on 12/30/2023 12:01:50 PM        Echo Completed 03/2023: 1. Left ventricular ejection fraction, by estimation, is 60 to 65%. The  left ventricle has normal function. The left ventricle has no regional  wall motion abnormalities. Left ventricular diastolic parameters were  normal.   2. Right ventricular systolic function is normal. The right ventricular  size is normal.   3. The mitral valve is degenerative. No evidence of mitral valve  regurgitation. No evidence of mitral stenosis.   4. The aortic valve is tricuspid. Aortic valve regurgitation is not  visualized. No aortic stenosis is present.   5. The inferior vena cava is normal in size with greater than 50%  respiratory variability, suggesting right atrial pressure of 3 mmHg.    CHA2DS2-VASc Score = 3  [name] score is based upon: CHF History: 0 HTN History: 1 Diabetes History: 0 Stroke History: 0 Vascular Disease History: 1 Age Score: 1 Gender Score: 0  ASSESSMENT AND PLAN: Paroxysmal Atrial Fibrillation (ICD10:  I48.0) [name] CHA2DS2-VASc score is 3, indicating a 3.2% annual risk of stroke.    -s/p A-fib ablation 04/2023 with ILR and low burden noted with [facility] discontinued in June with recent device alert on 12/15/2023 showing 3 episodes of A-fib (longest 52 minutes) - Today patient is sinus rhythm with occasional brief episodes of symptomatic AF -He reports experiencing poor sleep hygiene over the past 2 weeks which may be associated with his increase in AF. -Referral placed to [facility] sleep medicine to discuss sleep apnea and alternatives to CPAP -Start Pradaxa  150 mg twice daily -CBC in 1 month - Advised patient to moderate his intake of alcohol to decrease his risk of AF.   Primary HTN: BP well controlled. Continue current antihypertensive regimen.   History of OSA: - Patient previously intolerant to CPAP  and is now in favor of discussing possible alternatives - Referral placed to [facility] sleep medicine  History of CAD: -Coronary artery disease and calcification seen on CT in 2023 - Reports no chest pain or angina  Secondary Hypercoagulable State (ICD10:  D68.69) The patient is at significant risk for stroke/thromboembolism based upon his CHA2DS2-VASc Score of 3.    Follow up with the AF Clinic in 3 months  Jackee Alberts, NP-C [facility] [address] [place], [place] [address] [phone]

## 2024-01-09 ENCOUNTER — Ambulatory Visit

## 2024-01-09 DIAGNOSIS — I4891 Unspecified atrial fibrillation: Secondary | ICD-10-CM

## 2024-01-11 ENCOUNTER — Ambulatory Visit: Payer: Self-pay | Admitting: Cardiology

## 2024-01-11 LAB — CUP PACEART REMOTE DEVICE CHECK
Date Time Interrogation Session: 20251226063100
Implantable Pulse Generator Implant Date: 20250428
Pulse Gen Serial Number: 133067

## 2024-01-12 NOTE — Progress Notes (Signed)
 Remote Loop Recorder Transmission

## 2024-01-13 ENCOUNTER — Ambulatory Visit (INDEPENDENT_AMBULATORY_CARE_PROVIDER_SITE_OTHER): Payer: Self-pay | Admitting: *Deleted

## 2024-01-13 VITALS — BP 118/80 | Ht 68.0 in | Wt 158.0 lb

## 2024-01-13 DIAGNOSIS — Z0000 Encounter for general adult medical examination without abnormal findings: Secondary | ICD-10-CM | POA: Diagnosis not present

## 2024-01-13 NOTE — Patient Instructions (Signed)
 [name],  Thank you for taking the time for your Medicare Wellness Visit. I appreciate your continued commitment to your health goals. Please review the care plan we discussed, and feel free to reach out if I can assist you further.  Please note that Annual Wellness Visits do not include a physical exam. Some assessments may be limited, especially if the visit was conducted virtually. If needed, we may recommend an in-person follow-up with your provider.  Ongoing Care Seeing your primary care provider every 3 to 6 months helps us  monitor your health and provide consistent, personalized care.   Referrals If a referral was made during today's visit and you haven't received any updates within two weeks, please contact the referred provider directly to check on the status.  Recommended Screenings:  Health Maintenance  Topic Date Due   COVID-19 Vaccine (1) Never done   Pneumococcal Vaccine for age over 56 (1 of 2 - PCV) Never done   Zoster (Shingles) Vaccine (1 of 2) Never done   Colon Cancer Screening  Never done   Screening for Lung Cancer  Never done   Flu Shot  04/13/2024*   Medicare Annual Wellness Visit  01/12/2025   DTaP/Tdap/Td vaccine (2 - Td or Tdap) 10/30/2031   Hepatitis C Screening  Completed   Meningitis B Vaccine  Aged Out  *Topic was postponed. The date shown is not the original due date.       01/13/2024    9:32 AM  Advanced Directives  Does Patient Have a Medical Advance Directive? No  Would patient like information on creating a medical advance directive? No - Patient declined    Vision: Annual vision screenings are recommended for early detection of glaucoma, cataracts, and diabetic retinopathy. These exams can also reveal signs of chronic conditions such as diabetes and high blood pressure.  Dental: Annual dental screenings help detect early signs of oral cancer, gum disease, and other conditions linked to overall health, including heart disease and  diabetes.  Please see the attached documents for additional preventive care recommendations.   [name] , Thank you for taking time to come for your Medicare Wellness Visit. I appreciate your ongoing commitment to your health goals. Please review the following plan we discussed and let me know if I can assist you in the future.   Screening recommendations/referrals: Colonoscopy:  Recommended yearly ophthalmology/optometry visit for glaucoma screening and checkup Recommended yearly dental visit for hygiene and checkup  Vaccinations: Influenza vaccine:  Pneumococcal vaccine:  Tdap vaccine:  Shingles vaccine:       Preventive Care 65 Years and Older, Male Preventive care refers to lifestyle choices and visits with your health care provider that can promote health and wellness. What does preventive care include? A yearly physical exam. This is also called an annual well check. Dental exams once or twice a year. Routine eye exams. Ask your health care provider how often you should have your eyes checked. Personal lifestyle choices, including: Daily care of your teeth and gums. Regular physical activity. Eating a healthy diet. Avoiding tobacco and drug use. Limiting alcohol use. Practicing safe sex. Taking low doses of aspirin every day. Taking vitamin and mineral supplements as recommended by your health care provider. What happens during an annual well check? The services and screenings done by your health care provider during your annual well check will depend on your age, overall health, lifestyle risk factors, and family history of disease. Counseling  Your health care provider may ask  you questions about your: Alcohol use. Tobacco use. Drug use. Emotional well-being. Home and relationship well-being. Sexual activity. Eating habits. History of falls. Memory and ability to understand (cognition). Work and work astronomer. Screening  You may have the following tests  or measurements: Height, weight, and BMI. Blood pressure. Lipid and cholesterol levels. These may be checked every 5 years, or more frequently if you are over 81 years old. Skin check. Lung cancer screening. You may have this screening every year starting at age 48 if you have a 30-pack-year history of smoking and currently smoke or have quit within the past 15 years. Fecal occult blood test (FOBT) of the stool. You may have this test every year starting at age 79. Flexible sigmoidoscopy or colonoscopy. You may have a sigmoidoscopy every 5 years or a colonoscopy every 10 years starting at age 87. Prostate cancer screening. Recommendations will vary depending on your family history and other risks. Hepatitis C blood test. Hepatitis B blood test. Sexually transmitted disease (STD) testing. Diabetes screening. This is done by checking your blood sugar (glucose) after you have not eaten for a while (fasting). You may have this done every 1-3 years. Abdominal aortic aneurysm (AAA) screening. You may need this if you are a current or former smoker. Osteoporosis. You may be screened starting at age 78 if you are at high risk. Talk with your health care provider about your test results, treatment options, and if necessary, the need for more tests. Vaccines  Your health care provider may recommend certain vaccines, such as: Influenza vaccine. This is recommended every year. Tetanus, diphtheria, and acellular pertussis (Tdap, Td) vaccine. You may need a Td booster every 10 years. Zoster vaccine. You may need this after age 35. Pneumococcal 13-valent conjugate (PCV13) vaccine. One dose is recommended after age 41. Pneumococcal polysaccharide (PPSV23) vaccine. One dose is recommended after age 30. Talk to your health care provider about which screenings and vaccines you need and how often you need them. This information is not intended to replace advice given to you by your health care provider. Make  sure you discuss any questions you have with your health care provider. Document Released: 01/27/2015 Document Revised: 09/20/2015 Document Reviewed: 11/01/2014 Elsevier Interactive Patient Education  2017 [employer].  Fall Prevention in the Home Falls can cause injuries. They can happen to people of all ages. There are many things you can do to make your home safe and to help prevent falls. What can I do on the outside of my home? Regularly fix the edges of walkways and driveways and fix any cracks. Remove anything that might make you trip as you walk through a door, such as a raised step or threshold. Trim any bushes or trees on the path to your home. Use bright outdoor lighting. Clear any walking paths of anything that might make someone trip, such as rocks or tools. Regularly check to see if handrails are loose or broken. Make sure that both sides of any steps have handrails. Any raised decks and porches should have guardrails on the edges. Have any leaves, snow, or ice cleared regularly. Use sand or salt on walking paths during winter. Clean up any spills in your garage right away. This includes oil or grease spills. What can I do in the bathroom? Use night lights. Install grab bars by the toilet and in the tub and shower. Do not use towel bars as grab bars. Use non-skid mats or decals in the tub or shower. If  you need to sit down in the shower, use a plastic, non-slip stool. Keep the floor dry. Clean up any water that spills on the floor as soon as it happens. Remove soap buildup in the tub or shower regularly. Attach bath mats securely with double-sided non-slip rug tape. Do not have throw rugs and other things on the floor that can make you trip. What can I do in the bedroom? Use night lights. Make sure that you have a light by your bed that is easy to reach. Do not use any sheets or blankets that are too big for your bed. They should not hang down onto the floor. Have a firm  chair that has side arms. You can use this for support while you get dressed. Do not have throw rugs and other things on the floor that can make you trip. What can I do in the kitchen? Clean up any spills right away. Avoid walking on wet floors. Keep items that you use a lot in easy-to-reach places. If you need to reach something above you, use a strong step stool that has a grab bar. Keep electrical cords out of the way. Do not use floor polish or wax that makes floors slippery. If you must use wax, use non-skid floor wax. Do not have throw rugs and other things on the floor that can make you trip. What can I do with my stairs? Do not leave any items on the stairs. Make sure that there are handrails on both sides of the stairs and use them. Fix handrails that are broken or loose. Make sure that handrails are as long as the stairways. Check any carpeting to make sure that it is firmly attached to the stairs. Fix any carpet that is loose or worn. Avoid having throw rugs at the top or bottom of the stairs. If you do have throw rugs, attach them to the floor with carpet tape. Make sure that you have a light switch at the top of the stairs and the bottom of the stairs. If you do not have them, ask someone to add them for you. What else can I do to help prevent falls? Wear shoes that: Do not have high heels. Have rubber bottoms. Are comfortable and fit you well. Are closed at the toe. Do not wear sandals. If you use a stepladder: Make sure that it is fully opened. Do not climb a closed stepladder. Make sure that both sides of the stepladder are locked into place. Ask someone to hold it for you, if possible. Clearly mark and make sure that you can see: Any grab bars or handrails. First and last steps. Where the edge of each step is. Use tools that help you move around (mobility aids) if they are needed. These include: Canes. Walkers. Scooters. Crutches. Turn on the lights when you go  into a dark area. Replace any light bulbs as soon as they burn out. Set up your furniture so you have a clear path. Avoid moving your furniture around. If any of your floors are uneven, fix them. If there are any pets around you, be aware of where they are. Review your medicines with your doctor. Some medicines can make you feel dizzy. This can increase your chance of falling. Ask your doctor what other things that you can do to help prevent falls. This information is not intended to replace advice given to you by your health care provider. Make sure you discuss any questions you have with  your health care provider. Document Released: 10/27/2008 Document Revised: 06/08/2015 Document Reviewed: 02/04/2014 Elsevier Interactive Patient Education  2017 [employer].

## 2024-01-13 NOTE — Progress Notes (Signed)
 "  Chief Complaint  Patient presents with   Medicare Wellness     Subjective:   Troy Chapman is a 68 y.o. male who presents for a Medicare Annual Wellness Visit.  Appointment(s) made: (02-06-2023) Vaccines not given:   declined today Screenings not ordered: Declined Referral/Order for patient has Cologuard at home discussed completing    Visit info / Clinical Intake: Medicare Wellness Visit Type:: Initial Annual Wellness Visit Persons participating in visit and providing information:: patient Interpreter Needed?: No Pre-visit prep was completed: no AWV questionnaire completed by patient prior to visit?: no Living arrangements:: (!) lives alone Patient's Overall Health Status Rating: (!) fair Typical amount of pain: (!) a lot Does pain affect daily life?: (!) yes Are you currently prescribed opioids?: no  Dietary Habits and Nutritional Risks How many meals a day?: 2 Eats fruit and vegetables daily?: yes Most meals are obtained by: eating out; preparing own meals In the last 2 weeks, have you had any of the following?: none Diabetic:: no  Functional Status Activities of Daily Living (to include ambulation/medication): Independent Ambulation: Independent Medication Administration: Independent Home Management (perform basic housework or laundry): Independent Manage your own finances?: yes Primary transportation is: driving Concerns about vision?: no *vision screening is required for WTM* Concerns about hearing?: no  Fall Screening Falls in the past year?: 0 Number of falls in past year: 0 Was there an injury with Fall?: 0 Fall Risk Category Calculator: 0 Patient Fall Risk Level: Low Fall Risk  Fall Risk Patient at Risk for Falls Due to: No Fall Risks Fall risk Follow up: Falls evaluation completed; Education provided; Falls prevention discussed  Home and Transportation Safety: All rugs have non-skid backing?: yes All stairs or steps have railings?: (!) no Grab  bars in the bathtub or shower?: yes Have non-skid surface in bathtub or shower?: yes Good home lighting?: (!) no Regular seat belt use?: yes Hospital stays in the last year:: no  Cognitive Assessment Difficulty concentrating, remembering, or making decisions? : yes Will 6CIT or Mini Cog be Completed: yes What year is it?: 0 points What month is it?: 0 points Give patient an address phrase to remember (5 components): Its very sunny outside today in December About what time is it?: 0 points Count backwards from 20 to 1: 0 points Say the months of the year in reverse: 2 points Repeat the address phrase from earlier: 2 points 6 CIT Score: 4 points  Advance Directives (For Healthcare) Does Patient Have a Medical Advance Directive?: No Would patient like information on creating a medical advance directive?: No - Patient declined  Reviewed/Updated  Reviewed/Updated: Reviewed All (Medical, Surgical, Family, Medications, Allergies, Care Teams, Patient Goals); Surgical History; Family History; Allergies; Medications; Care Teams; Patient Goals; Medical History    Allergies (verified) Azor  [amlodipine -olmesartan ], Benazepril , and Gabapentin    Current Medications (verified) Outpatient Encounter Medications as of 01/13/2024  Medication Sig   acetaminophen  (TYLENOL ) 650 MG CR tablet Takes up to 6 tablets daily   certolizumab pegol (CIMZIA) 2 X 200 MG KIT Inject into the skin every 30 (thirty) days.   dabigatran  (PRADAXA ) 150 MG CAPS capsule Take 1 capsule (150 mg total) by mouth 2 (two) times daily.   valsartan  (DIOVAN ) 160 MG tablet Take 160 mg by mouth daily.   meloxicam  (MOBIC ) 15 MG tablet Take 1 tablet by mouth once daily (Patient not taking: Reported on 01/13/2024)   No facility-administered encounter medications on file as of 01/13/2024.    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   Coronary artery disease involving native coronary artery of native heart without angina pectoris 10/29/2021   Cyclic citrullinated peptide (CCP) antibody positive 12/26/2020   Diarrhea 11/06/2020   Dizziness 11/07/2022   Epigastric pain 11/06/2020   Essential hypertension 12/15/2013   Fatigue 09/19/2017   Generalized anxiety disorder 04/30/2013   High risk medication use 11/06/2020   Humerus fracture    left   Hyperlipidemia 10/29/2021   Hypertension    Hypogonadism in male 12/21/2014   Low testosterone  09/19/2017   Open fracture of tooth 02/05/2019   Osteoarthritis of hand    Paresthesia of both hands 12/15/2013   Primary osteoarthritis of both hands 12/21/2014   Psoriasis 09/08/2019   Pulmonary nodule 10/29/2021   Screening for cancer 12/21/2014   Screening for heart disease 08/15/2021   Screening for lipid disorders 08/15/2021   Screening for prostate cancer 08/15/2021   Sensorineural hearing loss, bilateral 11/07/2022   Sleep apnea 2016   no cpap, per pt   Tobacco use disorder 04/30/2013   Wears contact lenses    Weight loss 11/06/2020   Past Surgical History:  Procedure Laterality Date   ATRIAL FIBRILLATION ABLATION N/A 05/12/2023   Procedure: ATRIAL FIBRILLATION ABLATION;  Surgeon: Kennyth Chew, MD;  Location[facility] INVASIVE CV LAB;  Service: Cardiovascular;  Laterality: N/A;   COLONOSCOPY  2012   Dr. Rollin   KNEE ARTHROSCOPY     right x 3   LOOP RECORDER INSERTION N/A 05/12/2023   Procedure: LOOP RECORDER INSERTION;  Surgeon: Kennyth Chew, MD;  Location: [facility] INVASIVE CV LAB;  Service: Cardiovascular;  Laterality: N/A;   ORIF HUMERUS FRACTURE Left 02/04/2020   Procedure: OPEN REDUCTION INTERNAL FIXATION (ORIF) HUMERAL SHAFT  FRACTURE;  Surgeon: Beverley Evalene BIRCH, MD;  Location: [facility];  Service: Orthopedics;  Laterality: Left;  block   SHOULDER ARTHROSCOPY     left x 2, and RTC   Family History  Problem Relation Age of Onset   Heart disease Mother        died of MI, diagnosed in 65s   Dementia Father    Alzheimer's disease Father    Stroke Maternal Grandmother    Cancer Neg Hx    Social History   Occupational History   Not on file  Tobacco Use   Smoking status: Every Day    Current packs/day: 1.00    Average packs/day: 1 pack/day for 50.0 years (50.0 ttl pk-yrs)    Types: Cigarettes   Smokeless tobacco: Never   Tobacco comments:    01/31/2023 Patient smokes a pack daily  Vaping Use   Vaping status: Never Used  Substance and Sexual Activity   Alcohol use: Yes    Alcohol/week: 10.0 standard drinks of alcohol    Types: 10 Glasses of wine per week    Comment: every day beers and then some wine   Drug use: No   Sexual activity: Not Currently   Tobacco Counseling Ready to quit: Not Answered Counseling given: Not Answered Tobacco comments: 01/31/2023 Patient smokes a pack daily  SDOH Screenings   Food Insecurity: No  Food Insecurity (01/13/2024)  Housing: Unknown (01/13/2024)  Transportation Needs: No Transportation Needs (01/13/2024)  Utilities: Not At Risk (01/13/2024)  Depression (PHQ2-9): High Risk (01/13/2024)  Physical Activity: Inactive (01/13/2024)  Social Connections: Socially Isolated (01/13/2024)  Stress: Stress Concern Present (01/13/2024)  Tobacco Use: High Risk (01/13/2024)  Health Literacy: Adequate Health Literacy (01/13/2024)   See flowsheets for full screening details  Depression Screen PHQ 2 & 9 Depression Scale- Over the past 2 weeks, how often have you been bothered by any of the following problems? Little interest or pleasure in doing things: 3 Feeling down, depressed, or hopeless (PHQ Adolescent also includes...irritable): 3 PHQ-2 Total Score:  6 Trouble falling or staying asleep, or sleeping too much: 0 Feeling tired or having little energy: 3 Poor appetite or overeating (PHQ Adolescent also includes...weight loss): 2 Feeling bad about yourself - or that you are a failure or have let yourself or your family down: 0 Trouble concentrating on things, such as reading the newspaper or watching television (PHQ Adolescent also includes...like school work): 1 Moving or speaking so slowly that other people could have noticed. Or the opposite - being so fidgety or restless that you have been moving around a lot more than usual: 0 Thoughts that you would be better off dead, or of hurting yourself in some way: 0 PHQ-9 Total Score: 12 If you checked off any problems, how difficult have these problems made it for you to do your work, take care of things at home, or get along with other people?: Very difficult     Goals Addressed             This Visit's Progress    Patient Stated       Not  be in pain             Objective:    Today's Vitals   01/13/24 0954  Weight: 158 lb (71.7 kg)  Height: 5' 8 (1.727 m)   Body mass index is 24.02 kg/m.  Hearing/Vision screen Hearing Screening - Comments:: No trouble hearing Vision Screening - Comments:: Not up to date Immunizations and Health Maintenance Health Maintenance  Topic Date Due   COVID-19 Vaccine (1) Never done   Pneumococcal Vaccine: 50+ Years (1 of 2 - PCV) Never done   Zoster Vaccines- Shingrix (1 of 2) Never done   Colonoscopy  Never done   Lung Cancer Screening  Never done   Influenza Vaccine  04/13/2024 (Originally 08/15/2023)   Medicare Annual Wellness (AWV)  01/12/2025   DTaP/Tdap/Td (2 - Td or Tdap) 10/30/2031   Hepatitis C Screening  Completed   Meningococcal B Vaccine  Aged Out        Assessment/Plan:  This is a routine wellness examination for [place].  Patient Care Team: Tysinger, Alm RAMAN, PA-C as PCP - General (Family Medicine) Croitoru, Jerel, MD  as PCP - Cardiology (Cardiology) Kennyth Chew, MD as PCP - Electrophysiology (Cardiology) Georjean Darice HERO, MD as Consulting Physician (Neurology) Vernon, Jon DEL, [facility] (Pharmacist)  I have personally reviewed and noted the following in the patients chart:   Medical and social history Use of alcohol, tobacco or illicit drugs  Current medications and supplements including opioid prescriptions. Functional ability and status Nutritional status Physical activity Advanced directives List of other physicians Hospitalizations, surgeries, and ER visits in previous 12 months Vitals Screenings to include cognitive, depression, and falls Referrals and appointments  No orders of the defined types were placed in this encounter.  In addition, I  have reviewed and discussed with patient certain preventive protocols, quality metrics, and best practice recommendations. A written personalized care plan for preventive services as well as general preventive health recommendations were provided to patient.   Brittani Purdum, LPN   [id number]/[id number]   Return in 1 year (on 01/12/2025).  After Visit Summary: (MyChart) Due to this being a telephonic visit, the after visit summary with patients personalized plan was offered to patient via MyChart   Nurse Notes: patient is seeing cardiologist for symptoms which is causing some of depression symptoms.   Patient has been recommended for Inspire from Cardiologist.    "

## 2024-01-19 ENCOUNTER — Encounter: Payer: Self-pay | Admitting: Cardiovascular Disease

## 2024-01-21 ENCOUNTER — Ambulatory Visit: Payer: Self-pay

## 2024-01-21 NOTE — Telephone Encounter (Signed)
 Called pt and scheduled appointment for tomorrow with Ludie, his pcp

## 2024-01-21 NOTE — Telephone Encounter (Signed)
 Called [facility] and advised them of patient's ER Refusal at this time.

## 2024-01-21 NOTE — Telephone Encounter (Addendum)
 FYI Only or Action Required?: Action required by provider: request for appointment, clinical question for provider, update on patient condition, and ER Refusal.  Patient was last seen in primary care on 09/23/2023 by Bulah Alm RAMAN, PA-C.  Called Nurse Triage reporting Dizziness.  Symptoms began episode currently happening today at the time of triage but patient states episodes ever since heart ablation a year ago.  Interventions attempted: Rest, hydration, or home remedies.  Symptoms are: gradually worsening.  Triage Disposition: Go to ED Now (or PCP Triage)  Patient/caregiver understands and will follow disposition?: No, wishes to speak with PCP                  Copied from CRM #[id number]. Topic: Clinical - Red Word Triage >> Jan 21, 2024  2:17 PM Amy B wrote: Red Word that prompted transfer to Nurse Triage: extreme fatigue, sees stars, blacking out Reason for Disposition  [1] Blurred vision or visual changes AND [2] present now AND [3] sudden onset or new (e.g., minutes, hours, days)  (Exception: Seeing floaters / black specks OR previously diagnosed migraine headaches with same symptoms.)  Answer Assessment - Initial Assessment Questions Patient had a heart ablation for Afib last year Patient states that ever since then he has been having symptoms---seeing blue and yellow lights especially in left eye, blacking out, can't think straight, extreme fatigue, low energy, ache all over  Patient was advised that at this time it is recommended that he be seen and evaluated immediately at the Emergency Room.  Patient states these episodes. Patient states sometimes he gets headaches and he rarely ever gets headache.   This RN advised him that if he had to pull over due to feeling like he was going to black out ---this RN offered to call an ambulance for him. Patient declined this at this time. This RN offered multiple times but patient declined.   Patient states he  also feels dizzy & has vertigo at times. Patient states he is almost home and when he gets home he is going to lay down. He states he has gone to an eye doctor and neurologist and cardiologist and he is still having these episodes. Patient advised ER is recommended due these symptoms and he still declined. He wants to be seen by his PCP sooner than his scheduled appointment at the end of the month. He is advised to call us  with any changes. He is also advised to call 911 if symptoms worsen.  Protocols used: Vision Loss or Change-A-AH

## 2024-01-22 ENCOUNTER — Ambulatory Visit (INDEPENDENT_AMBULATORY_CARE_PROVIDER_SITE_OTHER): Admitting: Medical

## 2024-01-22 VITALS — BP 108/68 | HR 68 | Temp 98.6°F | Wt 164.4 lb

## 2024-01-22 DIAGNOSIS — F17200 Nicotine dependence, unspecified, uncomplicated: Secondary | ICD-10-CM

## 2024-01-22 DIAGNOSIS — G4730 Sleep apnea, unspecified: Secondary | ICD-10-CM

## 2024-01-22 DIAGNOSIS — H539 Unspecified visual disturbance: Secondary | ICD-10-CM | POA: Diagnosis not present

## 2024-01-22 DIAGNOSIS — M069 Rheumatoid arthritis, unspecified: Secondary | ICD-10-CM | POA: Diagnosis not present

## 2024-01-22 DIAGNOSIS — R5382 Chronic fatigue, unspecified: Secondary | ICD-10-CM | POA: Diagnosis not present

## 2024-01-22 DIAGNOSIS — M6281 Muscle weakness (generalized): Secondary | ICD-10-CM

## 2024-01-22 DIAGNOSIS — R42 Dizziness and giddiness: Secondary | ICD-10-CM

## 2024-01-22 DIAGNOSIS — Z79899 Other long term (current) drug therapy: Secondary | ICD-10-CM

## 2024-01-22 DIAGNOSIS — E785 Hyperlipidemia, unspecified: Secondary | ICD-10-CM | POA: Diagnosis not present

## 2024-01-22 DIAGNOSIS — R531 Weakness: Secondary | ICD-10-CM

## 2024-01-22 DIAGNOSIS — I2510 Atherosclerotic heart disease of native coronary artery without angina pectoris: Secondary | ICD-10-CM

## 2024-01-22 NOTE — Progress Notes (Signed)
 "  Name: Troy Chapman   Date of Visit: 01/22/2024   Date of last visit with me: 12/22/2023   CHIEF COMPLAINT:  Chief Complaint  Patient presents with   Acute Visit    Had ablation back in march 2025- since then no energy, sees blue yellow dots in eyes, some dizziness- that has gotten better, everything he does- will call a lot of fatigue where he can just pass out and go to sleep. Seen neurology, ENT, cardiology, eye doctor for all these issues but no one can figure out why or what's going on. Cardiology did sleep study and said he has sleep apnea was referral to discuss inspire. Pt is not sleeping in bed any longer and will sleep in recliner       HPI:  Discussed the use of AI scribe software for clinical note transcription with the patient, who gave verbal consent to proceed.  History of Present Illness  Troy Chapman is a 69 year old male who presents with extreme fatigue, dizziness, and visual disturbances.  These are recurrent symptoms which he has had before and despite seeing multiple specialist including cardiology, ENT, neurology, he does not have a great answer as to why he is having the symptoms.  He has even had a head scan in the past year  He experiences extreme fatigue and a lack of energy, describing it as 'terrible' and stating he can 'barely get through the day.' He has episodes of dizziness and a sensation of nearly passing out, particularly noting visual disturbances such as seeing colors, especially blue and yellow, in his left eye. These symptoms sometimes affect both eyes, impairing his vision, and occur during activities such as driving, which has led to him pulling over due to the fear of passing out.  He describes muscle fatigue, noting that even simple tasks like holding his arm on the steering wheel or chewing food cause significant tiredness. His jaw muscles get tired while eating, and he feels he could 'go to sleep right now.' These symptoms have  significantly impacted his quality of life, work, and ability to drive.  He has consulted multiple specialists, including a neurologist, an eye doctor, and a cardiologist, but has not received a definitive diagnosis or effective treatment. His symptoms can vary, feeling well enough to work some days, but on others, he becomes so fatigued that he must stop driving and rest.  He is currently taking valsartan  160 mg for blood pressure, which he has been halving to 80 mg due to low readings. He also takes Pradaxa  twice daily, which he was recently restarted on after a cardiology visit. Additionally, he is on Cimzia for arthritis and psoriasis, and meloxicam  as an anti-inflammatory. He monitors his blood pressure at home, noting fluctuations, with recent readings dropping from 116/82 to 96/69 within minutes.  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 He is a lifelong smoker and experiences morning congestion but denies any new breathing issues. He denies any bleeding symptoms since starting Pradaxa .  No other aggravating or relieving factors. No other complaint.  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   Coronary artery disease involving native coronary artery of native heart without angina pectoris 10/29/2021   Cyclic citrullinated peptide (CCP) antibody positive 12/26/2020   Diarrhea 11/06/2020   Dizziness 11/07/2022   Epigastric pain 11/06/2020   Essential hypertension 12/15/2013   Fatigue 09/19/2017   Generalized anxiety disorder 04/30/2013   High risk medication  use 11/06/2020   Humerus fracture    left   Hyperlipidemia 10/29/2021   Hypertension    Hypogonadism in male 12/21/2014   Low testosterone  09/19/2017   Open fracture of tooth 02/05/2019   Osteoarthritis of hand    Paresthesia of both hands 12/15/2013   Primary osteoarthritis of both hands 12/21/2014   Psoriasis 09/08/2019   Pulmonary nodule 10/29/2021   Screening for cancer 12/21/2014   Screening for heart disease 08/15/2021   Screening for lipid disorders 08/15/2021   Screening for prostate cancer 08/15/2021   Sensorineural hearing loss, bilateral 11/07/2022   Sleep apnea 2016   no cpap, per pt   Tobacco use disorder 04/30/2013   Wears contact lenses    Weight loss 11/06/2020   Medications Ordered Prior to Encounter[1]  ROS as in subjective     OBJECTIVE:    BP 108/68   Pulse 68   Temp 98.6 F (37 C)   Wt 164 lb 6.4 oz (74.6 kg)   SpO2 99%   BMI 25.00 kg/m   BP Readings from Last 3 Encounters:  01/22/24 108/68  01/13/24 118/80  12/30/23 108/76    Wt Readings from Last 3 Encounters:  01/22/24 164 lb 6.4 oz (74.6 kg)  01/13/24 158 lb (71.7 kg)  12/30/23 162 lb 12.8 oz (73.8 kg)    General appearence: alert, no distress, WD/WN,  Somewhat pale looking skin in general HEENT: normocephalic, sclerae anicteric, TMs pearly, nares patent, no discharge or erythema, pharynx normal Oral cavity: MMM, no lesions Neck: supple, no lymphadenopathy, no thyromegaly, no masses Heart: RRR, normal S1, S2, no murmurs Lungs: Decreased breath sounds in general and a rumble with exhale, no wheezes, rhonchi, or rales Abdomen: +bs, soft, non tender, non distended, no masses, no hepatomegaly, no splenomegaly Pulses: 2+ symmetric, upper and lower extremities, normal cap refill No extremity edema Neuro: Some generalized weakness in the arms and legs 4-5 out of 5 bilaterally, worse in left lower leg, seems easily fatigable in general, otherwise CN II through XII intact, negative  Romberg MSK: Obvious arthritic changes of MCPs and fingers in general, wrist in general    ASSESSMENT/PLAN:   Encounter Diagnoses  Name Primary?   Weakness    Chronic fatigue    Muscle weakness Yes   Coronary artery disease involving native coronary artery of native heart without angina pectoris    Rheumatoid arthritis, involving unspecified site, unspecified whether rheumatoid factor present (HCC)    Hyperlipidemia, unspecified hyperlipidemia type    High risk medication use    Tobacco use disorder    Sleep apnea, unspecified type    Dizziness    Visual changes     We discussed the symptoms and concerns and wide differential  Labs ordered as below today to further evaluate his concerns  PFT shows moderate obstruction.  He is not currently on any medication for COPD but has used medication in the past for COPD.  He denies shortness of breath specifically but some of his weakness could be due to underlying lung disease  His blood  pressure is generally low today.  Orthostatics did not really reveal any major drop in blood pressure or increase in pulse but his blood pressure was already somewhat low.  In the past he has had issues with alcohol use as a coping mechanism for pain, prior thiamine  and other vitamin deficiency  History of sleep apnea but uses a recliner instead of lying on his back and does not have a CPAP, has declined this in the past  Pending labs and home blood pressure readings he may need to stop valsartan  since he is on 80 mg valsartan  and still getting low pressure readings    Janson was seen today for acute visit.  Diagnoses and all orders for this visit:  Muscle weakness -     Vitamin B12 -     Folate -     Sedimentation rate -     CK isoenzymes (brain, muscle injury)  Weakness  Chronic fatigue -     Vitamin B1 -     Vitamin B12 -     Folate -     Comprehensive metabolic panel with GFR -     CBC -     TSH -     Sedimentation rate -     CK  isoenzymes (brain, muscle injury)  Coronary artery disease involving native coronary artery of native heart without angina pectoris  Rheumatoid arthritis, involving unspecified site, unspecified whether rheumatoid factor present (HCC)  Hyperlipidemia, unspecified hyperlipidemia type  High risk medication use -     Comprehensive metabolic panel with GFR -     CBC  Tobacco use disorder  Sleep apnea, unspecified type  Dizziness -     Comprehensive metabolic panel with GFR -     CBC -     TSH  Visual changes   Spent > 45 minutes face to face with patient in discussion of symptoms, evaluation, plan and recommendations.    F/u pending labs   [facility]     [1]  Current Outpatient Medications on File Prior to Visit  Medication Sig Dispense Refill   acetaminophen  (TYLENOL ) 650 MG CR tablet Takes up to 6 tablets daily     certolizumab pegol (CIMZIA) 2 X 200 MG KIT Inject into the skin every 30 (thirty) days.     dabigatran  (PRADAXA ) 150 MG CAPS capsule Take 1 capsule (150 mg total) by mouth 2 (two) times daily. 180 capsule 1   meloxicam  (MOBIC ) 15 MG tablet Take 1 tablet by mouth once daily 90 tablet 0   valsartan  (DIOVAN ) 160 MG tablet Take 160 mg by mouth daily.     No current facility-administered medications on file prior to visit.   "

## 2024-01-28 LAB — CK ISOENZYMES
CK-BB: 0 %
CK-MB: 0 % (ref 0–3)
CK-MM: 100 % (ref 97–100)
Macro Type 1: 0 %
Macro Type 2: 0 %
Total CK: 67 U/L (ref 41–331)

## 2024-01-28 LAB — COMPREHENSIVE METABOLIC PANEL WITH GFR
ALT: 11 IU/L (ref 0–44)
AST: 14 IU/L (ref 0–40)
Albumin: 3.9 g/dL (ref 3.9–4.9)
Alkaline Phosphatase: 84 IU/L (ref 47–123)
BUN/Creatinine Ratio: 14 (ref 10–24)
BUN: 30 mg/dL — ABNORMAL HIGH (ref 8–27)
Bilirubin Total: <0.2 mg/dL (ref 0.0–1.2)
CO2: 17 mmol/L — ABNORMAL LOW (ref 20–29)
Calcium: 8.3 mg/dL — ABNORMAL LOW (ref 8.6–10.2)
Chloride: 107 mmol/L — ABNORMAL HIGH (ref 96–106)
Creatinine, Ser: 2.14 mg/dL — ABNORMAL HIGH (ref 0.76–1.27)
Globulin, Total: 2.4 g/dL (ref 1.5–4.5)
Glucose: 90 mg/dL (ref 70–99)
Potassium: 4.9 mmol/L (ref 3.5–5.2)
Sodium: 139 mmol/L (ref 134–144)
Total Protein: 6.3 g/dL (ref 6.0–8.5)
eGFR: 33 mL/min/1.73 — ABNORMAL LOW

## 2024-01-28 LAB — SEDIMENTATION RATE: Sed Rate: 25 mm/h (ref 0–30)

## 2024-01-28 LAB — CBC
Hematocrit: 39 % (ref 37.5–51.0)
Hemoglobin: 12.7 g/dL — ABNORMAL LOW (ref 13.0–17.7)
MCH: 29.7 pg (ref 26.6–33.0)
MCHC: 32.6 g/dL (ref 31.5–35.7)
MCV: 91 fL (ref 79–97)
Platelets: 138 x10E3/uL — ABNORMAL LOW (ref 150–450)
RBC: 4.28 x10E6/uL (ref 4.14–5.80)
RDW: 13.6 % (ref 11.6–15.4)
WBC: 7.2 x10E3/uL (ref 3.4–10.8)

## 2024-01-28 LAB — FOLATE: Folate: 6.2 ng/mL

## 2024-01-28 LAB — TSH: TSH: 1.51 u[IU]/mL (ref 0.450–4.500)

## 2024-01-28 LAB — VITAMIN B1: Thiamine: 103.8 nmol/L (ref 66.5–200.0)

## 2024-01-28 LAB — VITAMIN B12: Vitamin B-12: 274 pg/mL (ref 232–1245)

## 2024-01-29 ENCOUNTER — Ambulatory Visit: Payer: Self-pay | Admitting: Medical

## 2024-01-29 ENCOUNTER — Other Ambulatory Visit: Payer: Self-pay | Admitting: Medical

## 2024-01-29 MED ORDER — VALSARTAN 40 MG PO TABS: 40.0000 mg | ORAL_TABLET | Freq: Every day | ORAL | 2 refills | Status: AC

## 2024-01-29 NOTE — Progress Notes (Signed)
 Call and make sure patient sees the MyChart results as I put a lot of recommendations.  See if he is agreeable to the various recommendations which were several   Results sent to patient as below  Labs showed chronic kidney disease, a little low calcium , chronically low platelets and a little bit of anemia.  B12 is on the low end of normal thyroid  okay, sed rate marker of inflammation okay, CK muscle marker okay  I have several recommendations: 1-I would recommend cutting down to valsartan  40 mg for the time being which should be half of the dose you are taking now given recent low readings and fatigue.  I will send this to the pharmacy to replace the valsartan  160 which you have been cutting in half  2-I would recommend a trial back on an inhaler such as Advair or Symbicort or Brester he as you have abnormal prior lung function and history of COPD.  Sometimes the inhalers will help overall stamina and energy.  Let me know if agreeable.  Another option over inhaler is a home nebulizer device for breathing treatment if you would prefer this instead  3-given the kidney marker you really should not be on any anti-inflammatory such as meloxicam  Mobic  ibuprofen Aleve or similar.  Talk to your rheumatologist about pain control in general given your kidney function  4-I recommend a B12 supplement such as 1000 mcg B12 supplement daily over-the-counter along with a vitamin D supplement 2000 units daily over-the-counter given the weakness of the muscles and the energy.  A daily over-the-counter thiamine  and folate supplement would  be reasonable as well particularly if you drink alcohol regularly  5-when you saw neurology which is a few years ago, they had recommended an MRI brain which I do not believe you ever had.  If agreeable I can put in a new order for MRI brain to further evaluate your symptoms.  And since it has been more than a year since you saw them and you have now neck and face weakness including  with chewing recently, it would be reasonable to go back and see neurology for this and chronic fatigue.  You may or may not benefit from medicines that help with narcolepsy/chronic fatigue  6-you are not currently on any cholesterol-lowering medicine and you have a history of some cholesterol plaque in your coronary arteries.  Since you did not tolerate prior statins I would recommend the every 2-week injectable such as Repatha or Praluent to lower your overall cholesterol and risk of heart disease  7-finally you have chronically low platelets.  Even though it is stable, you have never had a consult with hematology about this.  There are multiple reasons your platelets could be low but at some point either now or in the future you will need to see hematology.  Let me know if you want to pursue this at this time

## 2024-02-03 ENCOUNTER — Other Ambulatory Visit: Payer: Self-pay | Admitting: Medical

## 2024-02-03 DIAGNOSIS — R42 Dizziness and giddiness: Secondary | ICD-10-CM

## 2024-02-03 DIAGNOSIS — D696 Thrombocytopenia, unspecified: Secondary | ICD-10-CM

## 2024-02-03 DIAGNOSIS — I10 Essential (primary) hypertension: Secondary | ICD-10-CM

## 2024-02-03 DIAGNOSIS — F17200 Nicotine dependence, unspecified, uncomplicated: Secondary | ICD-10-CM

## 2024-02-03 DIAGNOSIS — R634 Abnormal weight loss: Secondary | ICD-10-CM

## 2024-02-03 DIAGNOSIS — R202 Paresthesia of skin: Secondary | ICD-10-CM

## 2024-02-03 MED ORDER — VITAMIN B-12 1000 MCG PO TABS: 1000.0000 ug | ORAL_TABLET | Freq: Every day | ORAL | 1 refills | Status: AC

## 2024-02-03 MED ORDER — BREZTRI AEROSPHERE 160-9-4.8 MCG/ACT IN AERO: 2.0000 | INHALATION_SPRAY | Freq: Two times a day (BID) | RESPIRATORY_TRACT | 2 refills | Status: DC

## 2024-02-03 MED ORDER — VITAMIN D 50 MCG (2000 UT) PO CAPS: 1.0000 | ORAL_CAPSULE | Freq: Every day | ORAL | 1 refills | Status: AC

## 2024-02-03 MED ORDER — BUDESONIDE-FORMOTEROL FUMARATE 160-4.5 MCG/ACT IN AERO: 2.0000 | INHALATION_SPRAY | Freq: Two times a day (BID) | RESPIRATORY_TRACT | 2 refills | Status: DC

## 2024-02-06 ENCOUNTER — Ambulatory Visit: Admitting: Medical

## 2024-02-06 VITALS — BP 120/80 | HR 67 | Ht 68.0 in | Wt 168.2 lb

## 2024-02-06 DIAGNOSIS — Z79899 Other long term (current) drug therapy: Secondary | ICD-10-CM

## 2024-02-06 DIAGNOSIS — Z125 Encounter for screening for malignant neoplasm of prostate: Secondary | ICD-10-CM

## 2024-02-06 DIAGNOSIS — F411 Generalized anxiety disorder: Secondary | ICD-10-CM | POA: Diagnosis not present

## 2024-02-06 DIAGNOSIS — I2510 Atherosclerotic heart disease of native coronary artery without angina pectoris: Secondary | ICD-10-CM

## 2024-02-06 DIAGNOSIS — Z13220 Encounter for screening for lipoid disorders: Secondary | ICD-10-CM | POA: Diagnosis not present

## 2024-02-06 DIAGNOSIS — Z1211 Encounter for screening for malignant neoplasm of colon: Secondary | ICD-10-CM | POA: Diagnosis not present

## 2024-02-06 DIAGNOSIS — Z7185 Encounter for immunization safety counseling: Secondary | ICD-10-CM

## 2024-02-06 DIAGNOSIS — N400 Enlarged prostate without lower urinary tract symptoms: Secondary | ICD-10-CM | POA: Diagnosis not present

## 2024-02-06 DIAGNOSIS — L4050 Arthropathic psoriasis, unspecified: Secondary | ICD-10-CM

## 2024-02-06 DIAGNOSIS — I10 Essential (primary) hypertension: Secondary | ICD-10-CM | POA: Diagnosis not present

## 2024-02-06 DIAGNOSIS — Z0000 Encounter for general adult medical examination without abnormal findings: Secondary | ICD-10-CM

## 2024-02-06 DIAGNOSIS — F17200 Nicotine dependence, unspecified, uncomplicated: Secondary | ICD-10-CM | POA: Diagnosis not present

## 2024-02-06 DIAGNOSIS — Z1389 Encounter for screening for other disorder: Secondary | ICD-10-CM

## 2024-02-06 DIAGNOSIS — Z136 Encounter for screening for cardiovascular disorders: Secondary | ICD-10-CM | POA: Diagnosis not present

## 2024-02-06 MED ORDER — BUDESONIDE-FORMOTEROL FUMARATE 160-4.5 MCG/ACT IN AERO: 2.0000 | INHALATION_SPRAY | Freq: Two times a day (BID) | RESPIRATORY_TRACT | 5 refills | Status: AC

## 2024-02-06 NOTE — Progress Notes (Signed)
 "  Name: Troy Chapman   Date of Visit: 02/06/24   Date of last visit with me: 01/22/2024   CHIEF COMPLAINT:  Chief Complaint  Patient presents with   Annual Exam    Fasting cpe, no concerns, had cologuard kit at home, declines vaccines today       HPI:  Discussed the use of AI scribe software for clinical note transcription with the patient, who gave verbal consent to proceed.  History of Present Illness  Here for well visit.  I saw him recently on January 22, 2024 for a variety of symptoms and he was not feeling well then.  He feels much improved now after some changes we made at that visit.  Patient Care Team: Aubrea Meixner, Alm RAMAN, PA-C as PCP - General (Family Medicine) Francyne Headland, MD as PCP - Cardiology (Cardiology) Kennyth Chew, MD as PCP - Electrophysiology (Cardiology) Georjean Darice HERO, MD as Consulting Physician (Neurology) Lionell Jon DEL, [facility] (Pharmacist) Dr. Curtistine Sayre at [facility] medical pain clinic Dr. Cindy Setter, rheumatology  At his last visit we had him lower his dose of valsartan  to 40 mg which he is doing.  He is blood pressure and is overall wellness is improving.  He feels much better than last visit  He also stopped NSAID last time due to abnormal kidney function.  He has seen pain management since last visit and was started on some new medication which is helping.   He is getting ready to start the B12 and vitamin D  supplements we recommended last time.  He has been using alcohol to cope with his pain but has cut back since starting on the medication through pain clinic.  He continues to smoke regularly.  His main concerns is ongoing chronic arthritis and chronic pain  No other aggravating or relieving factors. No other complaint.   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/19/2017   Coronary artery disease involving native coronary artery of native heart without angina pectoris 10/29/2021   Cyclic citrullinated peptide (CCP) antibody positive 12/26/2020   Diarrhea 11/06/2020   Dizziness 11/07/2022   Epigastric pain 11/06/2020   Essential hypertension 12/15/2013   Fatigue 09/19/2017   Generalized anxiety disorder 04/30/2013   High risk medication use 11/06/2020   Humerus fracture    left   Hyperlipidemia 10/29/2021   Hypertension    Hypogonadism in male 12/21/2014   Low testosterone  09/19/2017   Open fracture of tooth 02/05/2019   Osteoarthritis of hand    Paresthesia of both hands 12/15/2013   Primary osteoarthritis of both hands 12/21/2014   Psoriasis 09/08/2019   Pulmonary nodule 10/29/2021   Screening for cancer 12/21/2014   Screening for heart disease 08/15/2021   Screening for lipid disorders 08/15/2021   Screening for prostate cancer 08/15/2021   Sensorineural hearing loss, bilateral 11/07/2022   Sleep apnea 2016   no cpap, per pt   Tobacco use disorder 04/30/2013   Wears contact lenses    Weight loss 11/06/2020    Medications Ordered Prior to Encounter[2]   Current Medications[3]  Family History  Problem Relation Age of Onset  Heart disease Mother        died of MI, diagnosed in 5s   Dementia Father    Alzheimer's disease Father    Stroke Maternal Grandmother    Cancer Neg Hx     Past Surgical History:  Procedure Laterality Date   ATRIAL FIBRILLATION ABLATION N/A 05/12/2023   Procedure: ATRIAL FIBRILLATION ABLATION;  Surgeon: Kennyth Chew, MD;  Location: [facility] INVASIVE CV LAB;  Service: Cardiovascular;  Laterality: N/A;   COLONOSCOPY  2012   Dr. Rollin   KNEE ARTHROSCOPY     right x 3   LOOP RECORDER INSERTION N/A 05/12/2023   Procedure: LOOP  RECORDER INSERTION;  Surgeon: Kennyth Chew, MD;  Location[facility] INVASIVE CV LAB;  Service: Cardiovascular;  Laterality: N/A;   ORIF HUMERUS FRACTURE Left 02/04/2020   Procedure: OPEN REDUCTION INTERNAL FIXATION (ORIF) HUMERAL SHAFT FRACTURE;  Surgeon: Beverley Evalene BIRCH, MD;  Location: [facility];  Service: Orthopedics;  Laterality: Left;  block   SHOULDER ARTHROSCOPY     left x 2, and RTC    ROS as in subjective    Objective: BP 120/80   Pulse 67   Ht 5' 8 (1.727 m)   Wt 168 lb 3.2 oz (76.3 kg)   SpO2 96%   BMI 25.57 kg/m   Wt Readings from Last 3 Encounters:  02/06/24 168 lb 3.2 oz (76.3 kg)  01/22/24 164 lb 6.4 oz (74.6 kg)  01/13/24 158 lb (71.7 kg)   BP Readings from Last 3 Encounters:  02/06/24 120/80  01/22/24 108/68  01/13/24 118/80    General appearence: alert, no distress, WD/WN, white male Skin: Unremarkable HEENT: normocephalic, sclerae anicteric, PERRLA, EOMi, nares patent, no discharge or erythema, pharynx normal Oral cavity: MMM, no lesions Neck: supple, no lymphadenopathy, no thyromegaly, no masses, no bruits Heart: RRR, normal S1, S2, no murmurs Lungs: Abnormal lung sounds throughout, scattered wheezes, no crackles Abdomen: +bs, soft, non tender, non distended, no masses, no hepatomegaly, no splenomegaly Back: non tender Musculoskeletal: Significant bony arthritis of both hands throughout, nontender, no swelling, no obvious deformity Extremities: no edema, no cyanosis, no clubbing Pulses: 2+ symmetric, upper and lower extremities, normal cap refill Neurological: alert, oriented x 3, CN2-12 intact, strength normal upper extremities and lower extremities, sensation normal throughout, DTRs 2+ throughout, no cerebellar signs, gait normal Psychiatric: normal affect, behavior normal, pleasant  GU/rectal-declined GU/rectal-declined GU/rectal - declined   Assessment: Encounter Diagnoses  Name Primary?   Encounter for health maintenance  examination in adult Yes   Screening for prostate cancer    Screening for colon cancer    Vaccine counseling    Encounter for lipid screening for cardiovascular disease    Screening for hematuria or proteinuria    Benign prostatic hyperplasia, unspecified whether lower urinary tract symptoms present    Tobacco use disorder    Psoriatic arthritis (HCC)    Hypertension, unspecified type    High risk medication use    Generalized anxiety disorder    Coronary artery disease involving native coronary artery of native heart without angina pectoris      Plan:  Health maintenance -Advise yearly eye doctor and dental visit -Counseled on vaccines.  He is due for several vaccines but declines -We discussed cancer screening -We discussed heart disease screening -Counseled on diet, exercise, nutrition -Begin vitamin B12 1000 mcg weekly supplement -Begin vitamin D  supplement as discussed at last visit  Screen for colon cancer -Referral for Cologuard  Screen for prostate cancer -Updated labs  today  Screening for lipids -History of elevated lipids but he has not been agreeable to cholesterol medicine in the past and this does not seem to be a big concern for him  COPD, chronic smoker, abnormal lung sounds -Advise CT chest lung cancer screening but he declines today -Begin Symbicort  maintenance inhaler.  We are going to use Breztri  which did help in the past but that was too expensive.  We discussed proper use of medication -Consider smoking cessation although he has been reluctant to quit smoking in the past  Chronic pain -Continue routine follow-up with pain management   Psoriatic arthritis  -Continue routine follow-up with your rheumatologist  Hypertension, CAD -Continue valsartan  40 mg daily recently reduced due to low blood pressures -Continue routine follow-up with cardiology    Troy Chapman was seen today for annual exam.  Diagnoses and all orders for this visit:  Encounter  for health maintenance examination in adult -     Lipid panel -     PSA -     Urinalysis, Routine w reflex microscopic  Screening for prostate cancer -     PSA  Screening for colon cancer -     Cologuard  Vaccine counseling  Encounter for lipid screening for cardiovascular disease -     Lipid panel  Screening for hematuria or proteinuria -     Urinalysis, Routine w reflex microscopic  Benign prostatic hyperplasia, unspecified whether lower urinary tract symptoms present -     PSA  Tobacco use disorder  Psoriatic arthritis (HCC)  Hypertension, unspecified type  High risk medication use  Generalized anxiety disorder  Coronary artery disease involving native coronary artery of native heart without angina pectoris  Other orders -     budesonide -formoterol  (SYMBICORT ) 160-4.5 MCG/ACT inhaler; Inhale 2 puffs into the lungs 2 (two) times daily.    F/u pending labs      [1]  Allergies Allergen Reactions   Azor  [Amlodipine -Olmesartan ] Other (See Comments)    Dizziness    Benazepril      dizziness   Gabapentin      incontinence  [2]  Current Outpatient Medications on File Prior to Visit  Medication Sig Dispense Refill   Cholecalciferol (VITAMIN D ) 50 MCG (2000 UT) CAPS Take 1 capsule (2,000 Units total) by mouth daily. 90 capsule 1   cyanocobalamin  (VITAMIN B12) 1000 MCG tablet Take 1 tablet (1,000 mcg total) by mouth daily. 90 tablet 1   oxyCODONE -acetaminophen  (PERCOCET /ROXICET) 5-325 MG tablet Take 5 tablets by mouth daily. 1 tablets 3 times a day and 2 tablets at bedtime     valsartan  (DIOVAN ) 40 MG tablet Take 1 tablet (40 mg total) by mouth daily. 30 tablet 2   acetaminophen  (TYLENOL ) 650 MG CR tablet Takes up to 6 tablets daily     No current facility-administered medications on file prior to visit.  [3]  Current Outpatient Medications:    budesonide -formoterol  (SYMBICORT ) 160-4.5 MCG/ACT inhaler, Inhale 2 puffs into the lungs 2 (two) times daily., Disp: 1  each, Rfl: 5   Cholecalciferol (VITAMIN D ) 50 MCG (2000 UT) CAPS, Take 1 capsule (2,000 Units total) by mouth daily., Disp: 90 capsule, Rfl: 1   cyanocobalamin  (VITAMIN B12) 1000 MCG tablet, Take 1 tablet (1,000 mcg total) by mouth daily., Disp: 90 tablet, Rfl: 1   oxyCODONE -acetaminophen  (PERCOCET /ROXICET) 5-325 MG tablet, Take 5 tablets by mouth daily. 1 tablets 3 times a day and 2 tablets at bedtime, Disp: , Rfl:    valsartan  (DIOVAN ) 40 MG tablet, Take  1 tablet (40 mg total) by mouth daily., Disp: 30 tablet, Rfl: 2   acetaminophen  (TYLENOL ) 650 MG CR tablet, Takes up to 6 tablets daily, Disp: , Rfl:   "

## 2024-02-07 LAB — URINALYSIS, ROUTINE W REFLEX MICROSCOPIC
Bilirubin, UA: NEGATIVE
Glucose, UA: NEGATIVE
Ketones, UA: NEGATIVE
Leukocytes,UA: NEGATIVE
Nitrite, UA: NEGATIVE
RBC, UA: NEGATIVE
Specific Gravity, UA: 1.025 (ref 1.005–1.030)
Urobilinogen, Ur: 1 mg/dL (ref 0.2–1.0)
pH, UA: 5 (ref 5.0–7.5)

## 2024-02-07 LAB — LIPID PANEL
Chol/HDL Ratio: 2.8 ratio (ref 0.0–5.0)
Cholesterol, Total: 149 mg/dL (ref 100–199)
HDL: 54 mg/dL
LDL Chol Calc (NIH): 74 mg/dL (ref 0–99)
Triglycerides: 117 mg/dL (ref 0–149)
VLDL Cholesterol Cal: 21 mg/dL (ref 5–40)

## 2024-02-07 LAB — MICROSCOPIC EXAMINATION
Bacteria, UA: NONE SEEN
Casts: NONE SEEN /LPF
RBC, Urine: NONE SEEN /HPF (ref 0–2)
WBC, UA: NONE SEEN /HPF (ref 0–5)

## 2024-02-07 LAB — PSA: Prostate Specific Ag, Serum: <0.1 ng/mL (ref 0.0–4.0)

## 2024-02-08 ENCOUNTER — Ambulatory Visit: Payer: Self-pay | Admitting: Medical

## 2024-02-08 NOTE — Progress Notes (Signed)
 Results thru my chart

## 2024-02-09 ENCOUNTER — Ambulatory Visit: Attending: Cardiology

## 2024-02-09 DIAGNOSIS — I4891 Unspecified atrial fibrillation: Secondary | ICD-10-CM

## 2024-02-11 LAB — CUP PACEART REMOTE DEVICE CHECK
Date Time Interrogation Session: 20260127122900
Implantable Pulse Generator Implant Date: 20250428
Pulse Gen Serial Number: 133067

## 2024-02-13 NOTE — Progress Notes (Signed)
 Remote Loop Recorder Transmission

## 2024-02-15 ENCOUNTER — Ambulatory Visit: Payer: Self-pay | Admitting: Cardiology

## 2024-02-20 ENCOUNTER — Inpatient Hospital Stay: Admitting: Hematology & Oncology

## 2024-02-20 ENCOUNTER — Inpatient Hospital Stay

## 2024-02-20 ENCOUNTER — Encounter: Payer: Self-pay | Admitting: Hematology & Oncology

## 2024-02-20 ENCOUNTER — Other Ambulatory Visit: Payer: Self-pay

## 2024-02-20 VITALS — BP 127/73 | HR 69 | Temp 98.9°F | Resp 18 | Ht 68.0 in | Wt 170.0 lb

## 2024-02-20 DIAGNOSIS — D696 Thrombocytopenia, unspecified: Secondary | ICD-10-CM

## 2024-02-20 DIAGNOSIS — Z87891 Personal history of nicotine dependence: Secondary | ICD-10-CM

## 2024-02-20 DIAGNOSIS — F17200 Nicotine dependence, unspecified, uncomplicated: Secondary | ICD-10-CM

## 2024-02-20 DIAGNOSIS — Z129 Encounter for screening for malignant neoplasm, site unspecified: Secondary | ICD-10-CM

## 2024-02-20 LAB — CBC WITH DIFFERENTIAL (CANCER CENTER ONLY)
Abs Immature Granulocytes: 0.03 10*3/uL (ref 0.00–0.07)
Basophils Absolute: 0.1 10*3/uL (ref 0.0–0.1)
Basophils Relative: 1 %
Eosinophils Absolute: 0.1 10*3/uL (ref 0.0–0.5)
Eosinophils Relative: 1 %
HCT: 37.4 % — ABNORMAL LOW (ref 39.0–52.0)
Hemoglobin: 12.1 g/dL — ABNORMAL LOW (ref 13.0–17.0)
Immature Granulocytes: 0 %
Lymphocytes Relative: 11 %
Lymphs Abs: 1 10*3/uL (ref 0.7–4.0)
MCH: 30.2 pg (ref 26.0–34.0)
MCHC: 32.4 g/dL (ref 30.0–36.0)
MCV: 93.3 fL (ref 80.0–100.0)
Monocytes Absolute: 0.4 10*3/uL (ref 0.1–1.0)
Monocytes Relative: 4 %
Neutro Abs: 7.4 10*3/uL (ref 1.7–7.7)
Neutrophils Relative %: 83 %
Platelet Count: 170 10*3/uL (ref 150–400)
RBC: 4.01 MIL/uL — ABNORMAL LOW (ref 4.22–5.81)
RDW: 14.3 % (ref 11.5–15.5)
WBC Count: 9 10*3/uL (ref 4.0–10.5)
nRBC: 0 % (ref 0.0–0.2)

## 2024-02-20 LAB — SAVE SMEAR(SSMR), FOR PROVIDER SLIDE REVIEW

## 2024-02-20 NOTE — Progress Notes (Signed)
 Referral MD  Reason for Referral: Thrombocytopenia -transient  Chief Complaint  Patient presents with   New Patient (Initial Visit)  : I am not sure why I am here.  HPI: Troy Chapman is a very nice 69 year old white male.  He has quite a few health issues.  He has had cardiac ablation for atrial fibrillation.  He had surgery for arthritis.  He is followed by Dr. Bulah.  Apparently, he was worried about his labs when he was seen before.  He had lab work that was done.  He was noted to have a low platelet count.  This really was not low at all.  He had lab work that was done but a month ago.  His white count 7.2.  Hemoglobin 12.7.  Platelet count 138,000.  I think his bigger problem clearly is his renal function.  His BUN was 30 and creatinine 2.14.  His calcium  8.3.  Total protein 6.3.  He has had no bleeding.  He has had no bruising.  He is retired.  He does do occasional jobs with respect to woodworking.  He is involved with high-end remodels.  As far as he knows, he has not a problems with COVID.  He does enjoy an adult beverage every now and then.  Troy Chapman does smoke.  He smokes a pack a day.  I think he really needs to have a screening CT scan done.  He has had no weight loss or weight gain.  Overall, I would have say that his performance status is probably ECOG 1.    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   Coronary artery disease involving native coronary artery of native heart without angina pectoris 10/29/2021   Cyclic citrullinated peptide (CCP) antibody positive 12/26/2020   Diarrhea 11/06/2020   Dizziness 11/07/2022   Epigastric pain  11/06/2020   Essential hypertension 12/15/2013   Fatigue 09/19/2017   Generalized anxiety disorder 04/30/2013   High risk medication use 11/06/2020   Humerus fracture    left   Hyperlipidemia 10/29/2021   Hypertension    Hypogonadism in male 12/21/2014   Low testosterone  09/19/2017   Open fracture of tooth 02/05/2019   Osteoarthritis of hand    Paresthesia of both hands 12/15/2013   Primary osteoarthritis of both hands 12/21/2014   Psoriasis 09/08/2019   Pulmonary nodule 10/29/2021   Screening for cancer 12/21/2014   Screening for heart disease 08/15/2021   Screening for lipid disorders 08/15/2021   Screening for prostate cancer 08/15/2021   Sensorineural hearing loss, bilateral 11/07/2022   Sleep apnea 2016   no cpap, per pt   Tobacco use disorder 04/30/2013   Wears contact lenses    Weight loss 11/06/2020  :   Past Surgical History:  Procedure Laterality Date   ATRIAL FIBRILLATION ABLATION N/A 05/12/2023   Procedure: ATRIAL FIBRILLATION ABLATION;  Surgeon: Kennyth Chew, MD;  Location: [facility] INVASIVE CV LAB;  Service: Cardiovascular;  Laterality: N/A;   COLONOSCOPY  2012   Dr. Rollin   KNEE ARTHROSCOPY     right x 3   LOOP RECORDER INSERTION N/A 05/12/2023   Procedure: LOOP RECORDER  INSERTION;  Surgeon: Kennyth Chew, MD;  Location: [facility] INVASIVE CV LAB;  Service: Cardiovascular;  Laterality: N/A;   ORIF HUMERUS FRACTURE Left 02/04/2020   Procedure: OPEN REDUCTION INTERNAL FIXATION (ORIF) HUMERAL SHAFT FRACTURE;  Surgeon: Beverley Evalene BIRCH, MD;  Location: [facility];  Service: Orthopedics;  Laterality: Left;  block   SHOULDER ARTHROSCOPY     left x 2, and RTC  :  Current Medications[1]:  :  Allergies[2]:   Family History  Problem Relation Age of Onset   Heart disease Mother        died of MI, diagnosed in 48s   Dementia Father    Alzheimer's disease Father    Stroke Maternal Grandmother    Cancer Neg Hx   :   Social History   Socioeconomic  History   Marital status: Married    Spouse name: Not on file   Number of children: Not on file   Years of education: Not on file   Highest education level: Not on file  Occupational History   Not on file  Tobacco Use   Smoking status: Every Day    Current packs/day: 1.00    Average packs/day: 1 pack/day for 50.0 years (50.0 ttl pk-yrs)    Types: Cigarettes   Smokeless tobacco: Never   Tobacco comments:    01/31/2023 Patient smokes a pack daily  Vaping Use   Vaping status: Never Used  Substance and Sexual Activity   Alcohol use: Yes    Alcohol/week: 10.0 standard drinks of alcohol    Types: 10 Glasses of wine per week    Comment: every day beers and then some wine   Drug use: No   Sexual activity: Not Currently  Other Topics Concern   Not on file  Social History Narrative   Married, 2 children, owns holiday representative business, active on the job. Right handed   Social Drivers of Health   Tobacco Use: High Risk (02/20/2024)   Patient History    Smoking Tobacco Use: Every Day    Smokeless Tobacco Use: Never    Passive Exposure: Not on file  Financial Resource Strain: Not on file  Food Insecurity: No Food Insecurity (01/13/2024)   Epic    Worried About Programme Researcher, Broadcasting/film/video in the Last Year: Never true    Ran Out of Food in the Last Year: Never true  Transportation Needs: No Transportation Needs (01/13/2024)   Epic    Lack of Transportation (Medical): No    Lack of Transportation (Non-Medical): No  Physical Activity: Inactive (01/13/2024)   Exercise Vital Sign    Days of Exercise per Week: 0 days    Minutes of Exercise per Session: 0 min  Stress: Stress Concern Present (01/13/2024)   [employer] of Occupational Health - Occupational Stress Questionnaire    Feeling of Stress: Rather much  Social Connections: Socially Isolated (01/13/2024)   Social Connection and Isolation Panel    Frequency of Communication with Friends and Family: Three times a week    Frequency of  Social Gatherings with Friends and Family: Three times a week    Attends Religious Services: Never    Active Member of Clubs or Organizations: No    Attends Banker Meetings: Never    Marital Status: Divorced  Catering Manager Violence: Not At Risk (01/13/2024)   Epic    Fear of Current or Ex-Partner: No    Emotionally Abused: No    Physically Abused: No    Sexually  Abused: No  Depression (PHQ2-9): Low Risk (02/20/2024)   Depression (PHQ2-9)    PHQ-2 Score: 0  Recent Concern: Depression (PHQ2-9) - High Risk (01/13/2024)   Depression (PHQ2-9)    PHQ-2 Score: 12  Alcohol Screen: Not on file  Housing: Unknown (01/13/2024)   Epic    Unable to Pay for Housing in the Last Year: No    Number of Times Moved in the Last Year: Not on file    Homeless in the Last Year: No  Utilities: Not At Risk (01/13/2024)   Epic    Threatened with loss of utilities: No  Health Literacy: Adequate Health Literacy (01/13/2024)   B1300 Health Literacy    Frequency of need for help with medical instructions: Never  :  Review of Systems  Constitutional: Negative.   HENT: Negative.    Eyes: Negative.   Respiratory: Negative.    Cardiovascular: Negative.   Gastrointestinal: Negative.   Genitourinary: Negative.   Musculoskeletal:  Positive for back pain, joint pain and myalgias.  Skin: Negative.   Neurological: Negative.   Endo/Heme/Allergies: Negative.   Psychiatric/Behavioral: Negative.       Exam:  Vital signs show temperature 98.9.  Pulse 69.  Blood pressure 127/73.  Weight is 170 pounds.  @IPVITALS @ Physical Exam Vitals reviewed.  HENT:     Head: Normocephalic and atraumatic.  Eyes:     Pupils: Pupils are equal, round, and reactive to light.  Cardiovascular:     Rate and Rhythm: Normal rate and regular rhythm.     Heart sounds: Normal heart sounds.  Pulmonary:     Effort: Pulmonary effort is normal.     Breath sounds: Normal breath sounds.  Abdominal:     General: Bowel  sounds are normal.     Palpations: Abdomen is soft.  Musculoskeletal:        General: No tenderness or deformity. Normal range of motion.     Cervical back: Normal range of motion.  Lymphadenopathy:     Cervical: No cervical adenopathy.  Skin:    General: Skin is warm and dry.     Findings: No erythema or rash.  Neurological:     Mental Status: He is alert and oriented to person, place, and time.  Psychiatric:        Behavior: Behavior normal.        Thought Content: Thought content normal.        Judgment: Judgment normal.     Recent Labs    02/20/24 1359  WBC 9.0  HGB 12.1*  HCT 37.4*  PLT 170   No results for input(s): NA, K, CL, CO2, GLUCOSE, BUN, CREATININE, CALCIUM  in the last 72 hours.  Blood smear review: Normochromic and normocytic population of red blood cells.  There is no nucleated red blood cells.  He has no teardrop cells.  There is no rouleaux formation.  He has no schistocytes or spherocytes.  White blood cells are normal in morphology and maturation.  Platelets are adequate in number and size.  He has well granulated platelets.  He may have a few large platelets.  Pathology: None    Assessment and Plan: TroyKwasny is a very nice 69 year old white male.  He is from [place].  He is incredibly interesting to talk to.  He is a designer, fashion/clothing with woodwork.  He show me pictures of his work.  It is very impressive.  Again I do not see any hematologic issue here.  His issue clearly is renal.  I would think that at some point, he may need to be referred to Nephrology.    I also worry about his lungs.  He smokes pack a day.  He has had extensive smoking history.  We will go ahead and set him up with a screening CT scan of his chest.  I think this would be reasonable.  Just for completeness, we will get him back in 3 months.  If his platelet count is still looking good in 3 months, then I think we can probably let him go.  I have to believe that  the culprit of the transient thrombocytopenia might be alcohol use.     [1]  Current Outpatient Medications:    acetaminophen  (TYLENOL ) 650 MG CR tablet, Takes up to 6 tablets daily, Disp: , Rfl:    budesonide -formoterol  (SYMBICORT ) 160-4.5 MCG/ACT inhaler, Inhale 2 puffs into the lungs 2 (two) times daily., Disp: 1 each, Rfl: 5   Cholecalciferol (VITAMIN D ) 50 MCG (2000 UT) CAPS, Take 1 capsule (2,000 Units total) by mouth daily., Disp: 90 capsule, Rfl: 1   cyanocobalamin  (VITAMIN B12) 1000 MCG tablet, Take 1 tablet (1,000 mcg total) by mouth daily., Disp: 90 tablet, Rfl: 1   oxyCODONE -acetaminophen  (PERCOCET /ROXICET) 5-325 MG tablet, Take 5 tablets by mouth daily. 1 tablets 3 times a day and 2 tablets at bedtime, Disp: , Rfl:    valsartan  (DIOVAN ) 40 MG tablet, Take 1 tablet (40 mg total) by mouth daily., Disp: 30 tablet, Rfl: 2 [2]  Allergies Allergen Reactions   Azor  [Amlodipine -Olmesartan ] Other (See Comments)    Dizziness    Benazepril      dizziness   Gabapentin      incontinence

## 2024-03-11 ENCOUNTER — Ambulatory Visit

## 2024-03-12 ENCOUNTER — Ambulatory Visit (HOSPITAL_BASED_OUTPATIENT_CLINIC_OR_DEPARTMENT_OTHER)

## 2024-03-30 ENCOUNTER — Ambulatory Visit (HOSPITAL_COMMUNITY): Admitting: Nurse Practitioner

## 2024-04-11 ENCOUNTER — Ambulatory Visit

## 2024-05-12 ENCOUNTER — Ambulatory Visit

## 2024-06-12 ENCOUNTER — Ambulatory Visit

## 2024-07-13 ENCOUNTER — Ambulatory Visit

## 2024-08-13 ENCOUNTER — Ambulatory Visit

## 2024-09-13 ENCOUNTER — Ambulatory Visit

## 2024-10-14 ENCOUNTER — Ambulatory Visit

## 2024-11-14 ENCOUNTER — Ambulatory Visit

## 2024-12-15 ENCOUNTER — Ambulatory Visit

## 2025-02-10 ENCOUNTER — Encounter: Admitting: Medical
